# Patient Record
Sex: Male | Born: 1935 | Race: White | Hispanic: No | Marital: Married | Smoking: Former smoker
Health system: Southern US, Community
[De-identification: ages and names within clinical notes are randomized; demographics above are authoritative.]

## PROBLEM LIST (undated history)

## (undated) DIAGNOSIS — Z950 Presence of cardiac pacemaker: Secondary | ICD-10-CM

## (undated) DIAGNOSIS — I4430 Unspecified atrioventricular block: Secondary | ICD-10-CM

## (undated) DIAGNOSIS — I472 Ventricular tachycardia: Secondary | ICD-10-CM

## (undated) DIAGNOSIS — E785 Hyperlipidemia, unspecified: Secondary | ICD-10-CM

## (undated) DIAGNOSIS — I480 Paroxysmal atrial fibrillation: Secondary | ICD-10-CM

## (undated) DIAGNOSIS — I714 Abdominal aortic aneurysm, without rupture, unspecified: Secondary | ICD-10-CM

## (undated) DIAGNOSIS — I4729 Other ventricular tachycardia: Secondary | ICD-10-CM

## (undated) HISTORY — DX: Hyperlipidemia, unspecified: E78.5

## (undated) HISTORY — DX: Other ventricular tachycardia: I47.29

## (undated) HISTORY — DX: Unspecified atrioventricular block: I44.30

## (undated) HISTORY — DX: Ventricular tachycardia: I47.2

## (undated) HISTORY — PX: RETINAL TEAR REPAIR CRYOTHERAPY: SHX5304

## (undated) HISTORY — DX: Abdominal aortic aneurysm, without rupture: I71.4

## (undated) HISTORY — DX: Abdominal aortic aneurysm, without rupture, unspecified: I71.40

## (undated) HISTORY — PX: MENISCUS REPAIR: SHX5179

## (undated) HISTORY — DX: Presence of cardiac pacemaker: Z95.0

---

## 1999-12-04 ENCOUNTER — Other Ambulatory Visit: Admission: RE | Admit: 1999-12-04 | Discharge: 1999-12-04 | Payer: Self-pay | Admitting: Urology

## 2001-07-19 ENCOUNTER — Encounter: Admission: RE | Admit: 2001-07-19 | Discharge: 2001-07-19 | Payer: Self-pay | Admitting: Orthopedic Surgery

## 2001-07-19 ENCOUNTER — Encounter: Payer: Self-pay | Admitting: Orthopedic Surgery

## 2002-07-25 ENCOUNTER — Ambulatory Visit (HOSPITAL_COMMUNITY): Admission: RE | Admit: 2002-07-25 | Discharge: 2002-07-25 | Payer: Self-pay | Admitting: *Deleted

## 2002-07-25 ENCOUNTER — Encounter (INDEPENDENT_AMBULATORY_CARE_PROVIDER_SITE_OTHER): Payer: Self-pay | Admitting: Specialist

## 2002-11-29 ENCOUNTER — Encounter: Payer: Self-pay | Admitting: Internal Medicine

## 2002-11-29 ENCOUNTER — Encounter: Admission: RE | Admit: 2002-11-29 | Discharge: 2002-11-29 | Payer: Self-pay | Admitting: Internal Medicine

## 2005-05-06 ENCOUNTER — Ambulatory Visit (HOSPITAL_COMMUNITY): Admission: RE | Admit: 2005-05-06 | Discharge: 2005-05-06 | Payer: Self-pay | Admitting: Orthopedic Surgery

## 2006-06-07 ENCOUNTER — Encounter: Admission: RE | Admit: 2006-06-07 | Discharge: 2006-06-07 | Payer: Self-pay | Admitting: Orthopedic Surgery

## 2007-05-30 ENCOUNTER — Ambulatory Visit: Payer: Self-pay | Admitting: Vascular Surgery

## 2008-06-08 ENCOUNTER — Ambulatory Visit: Payer: Self-pay | Admitting: Vascular Surgery

## 2008-06-29 ENCOUNTER — Encounter: Admission: RE | Admit: 2008-06-29 | Discharge: 2008-06-29 | Payer: Self-pay | Admitting: Vascular Surgery

## 2008-06-29 ENCOUNTER — Ambulatory Visit: Payer: Self-pay | Admitting: Vascular Surgery

## 2008-08-02 ENCOUNTER — Encounter: Payer: Self-pay | Admitting: Vascular Surgery

## 2008-08-02 ENCOUNTER — Ambulatory Visit: Payer: Self-pay | Admitting: Vascular Surgery

## 2008-08-02 ENCOUNTER — Inpatient Hospital Stay (HOSPITAL_COMMUNITY): Admission: RE | Admit: 2008-08-02 | Discharge: 2008-08-07 | Payer: Self-pay | Admitting: Vascular Surgery

## 2008-08-02 HISTORY — PX: ABDOMINAL AORTIC ANEURYSM REPAIR: SUR1152

## 2008-08-15 ENCOUNTER — Inpatient Hospital Stay (HOSPITAL_COMMUNITY): Admission: EM | Admit: 2008-08-15 | Discharge: 2008-08-16 | Payer: Self-pay | Admitting: Emergency Medicine

## 2008-08-15 ENCOUNTER — Ambulatory Visit: Payer: Self-pay | Admitting: Vascular Surgery

## 2008-08-15 ENCOUNTER — Encounter: Admission: RE | Admit: 2008-08-15 | Discharge: 2008-08-15 | Payer: Self-pay | Admitting: Vascular Surgery

## 2008-11-02 ENCOUNTER — Encounter: Admission: RE | Admit: 2008-11-02 | Discharge: 2008-11-02 | Payer: Self-pay | Admitting: Vascular Surgery

## 2008-11-02 ENCOUNTER — Ambulatory Visit: Payer: Self-pay | Admitting: Vascular Surgery

## 2008-11-13 ENCOUNTER — Ambulatory Visit (HOSPITAL_COMMUNITY): Admission: RE | Admit: 2008-11-13 | Discharge: 2008-11-13 | Payer: Self-pay | Admitting: *Deleted

## 2009-04-24 IMAGING — CT CT PELVIS W/ CM
2 of 7 series · 13 of 46 positions shown, 18 images · IV contrast (OMNI 350, WATER & [ID] OMNI 300)
Comparison: 06/29/2008

CT ABDOMEN

CLINICAL DATA: Abdominal pain.  AAA repair 08/02/2008.

CT ABDOMEN AND PELVIS WITH CONTRAST
TECHNIQUE: Multidetector CT imaging of the abdomen and pelvis was
performed using the standard protocol following bolus
administration of intravenous contrast.
Contrast: 100 ml Pmnipaque-QBB

[Series 6: delays · axial · 0.70mm/px · z∈[-304,+56]mm · 10 of 86 slices shown, 15 images]
[im 7/86  soft-tissue]
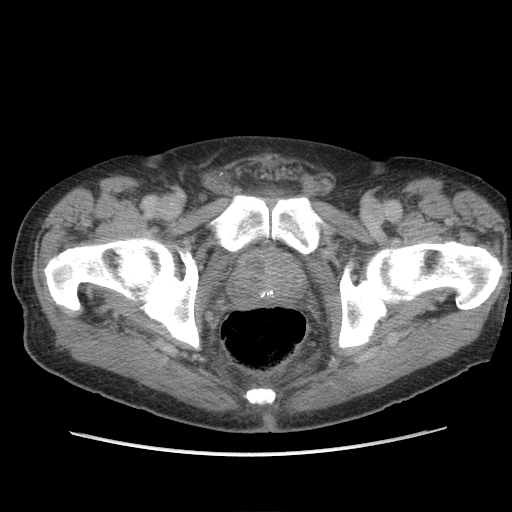
[im 7/86  bone]
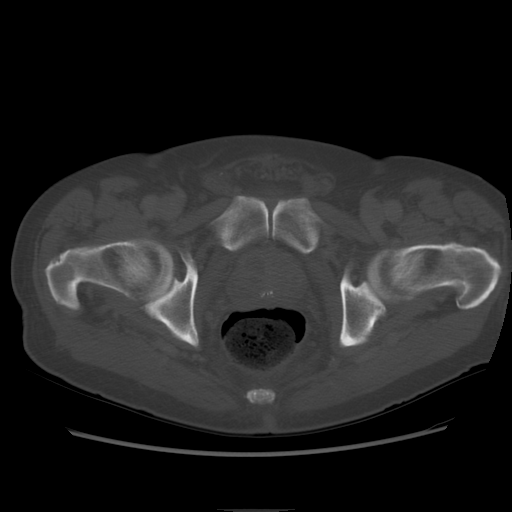
[im 20/86  soft-tissue]
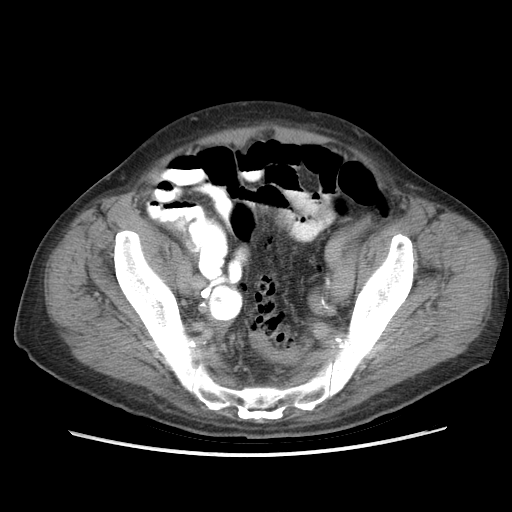
[im 27/86  soft-tissue]
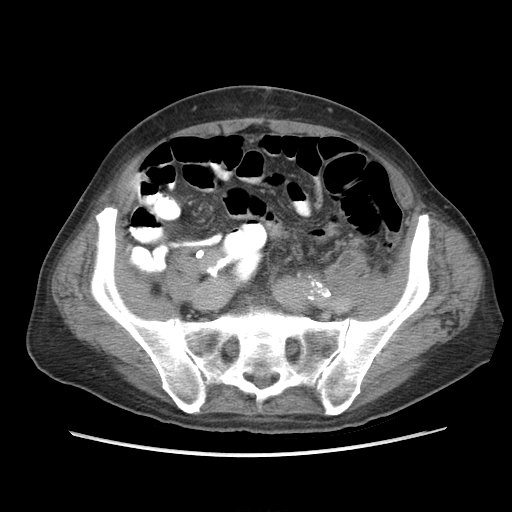
[im 33/86  soft-tissue]
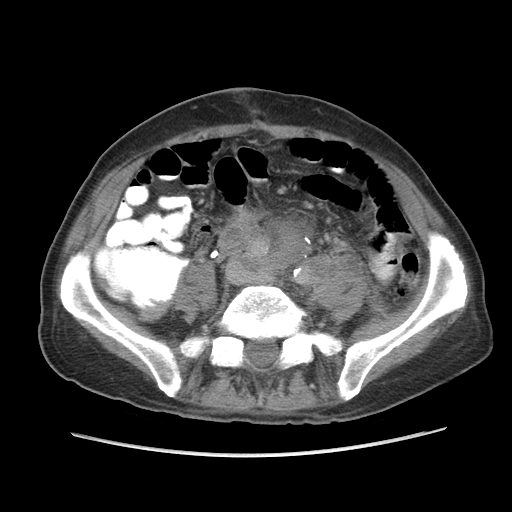
[im 46/86  soft-tissue]
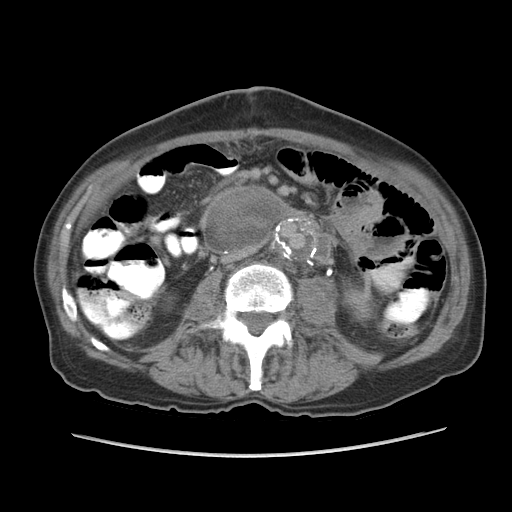
[im 53/86  soft-tissue]
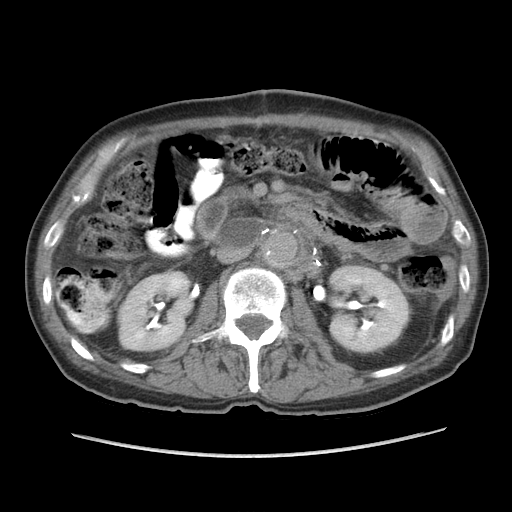
[im 59/86  soft-tissue]
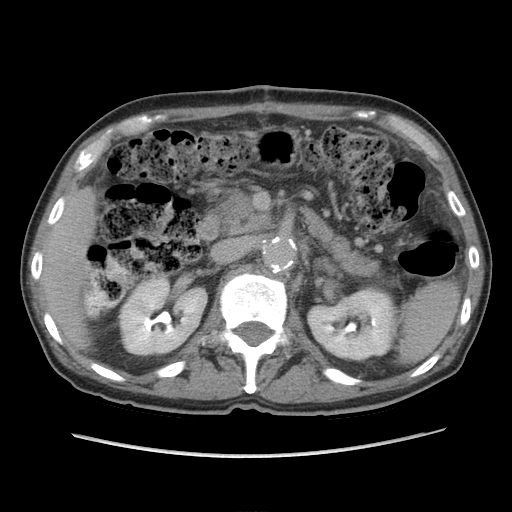
[im 59/86  lung]
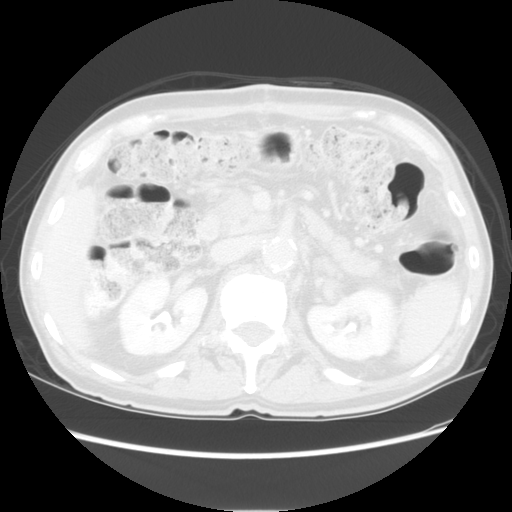
[im 66/86  lung]
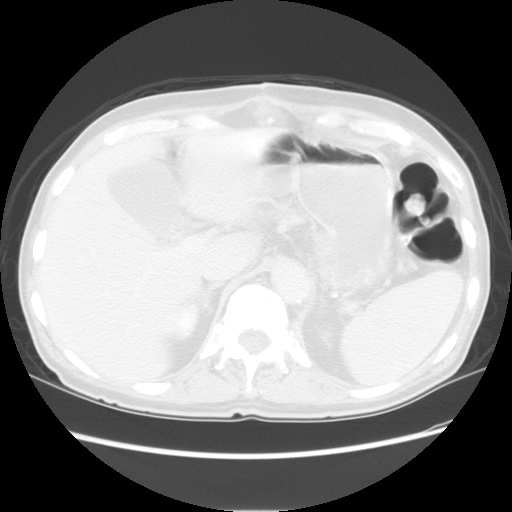
[im 72/86  soft-tissue]
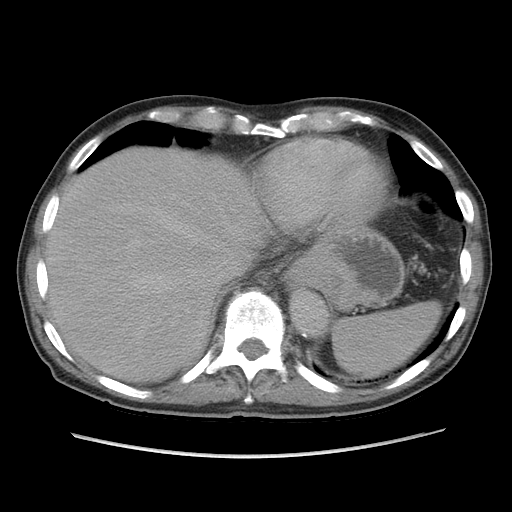
[im 72/86  lung]
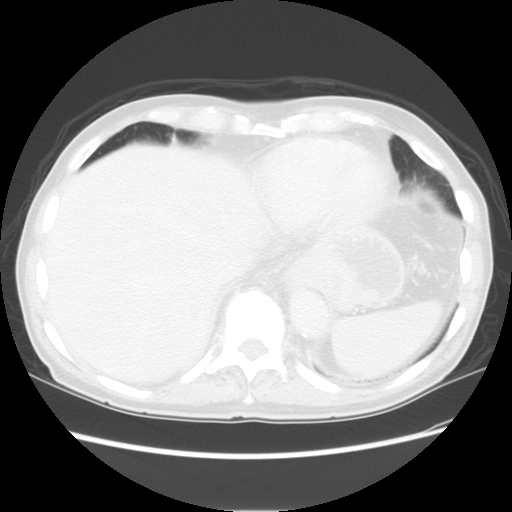
[im 79/86  soft-tissue]
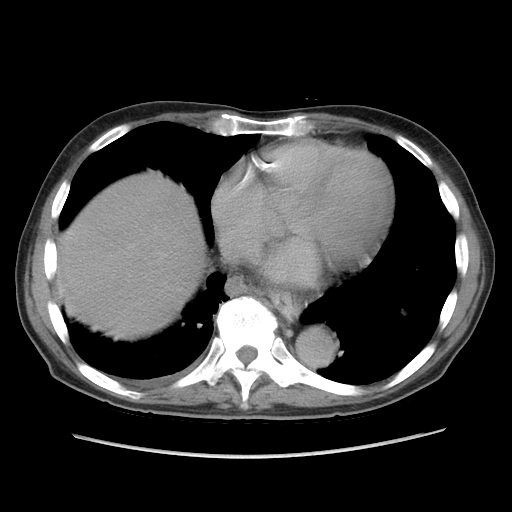
[im 79/86  lung]
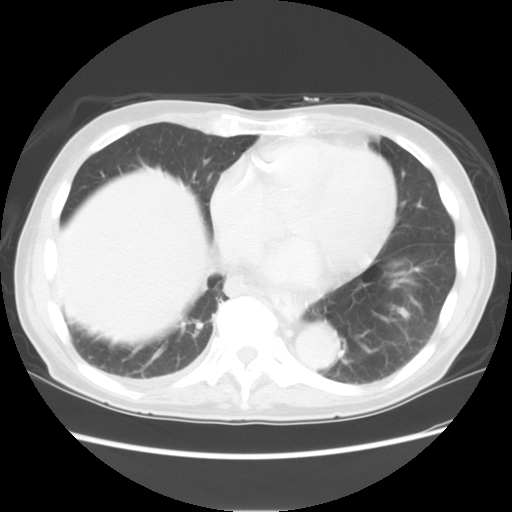
[im 79/86  bone]
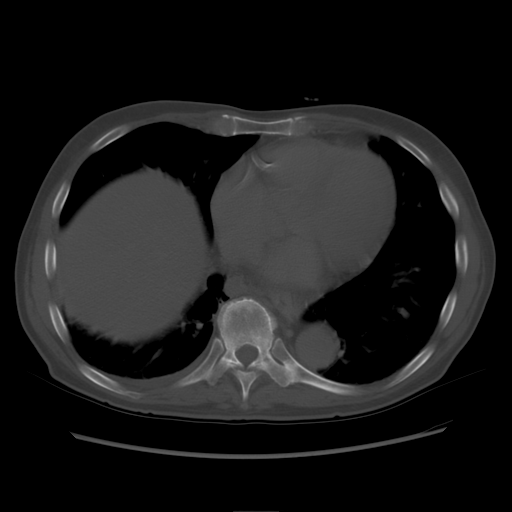

[Series 602: sagittal body · sagittal · 0.88mm/px · 3 of 142 slices shown]
[im 36/142  soft-tissue]
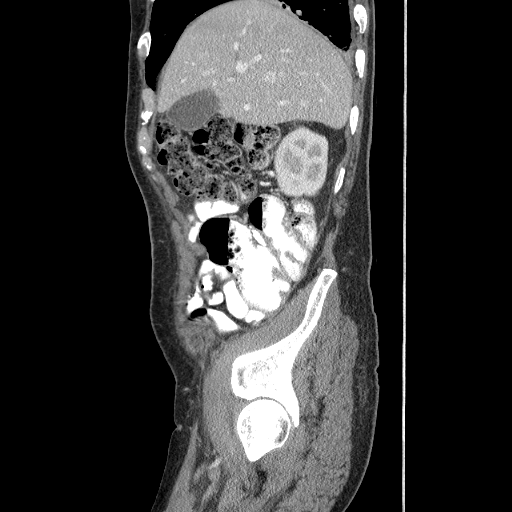
[im 71/142  soft-tissue]
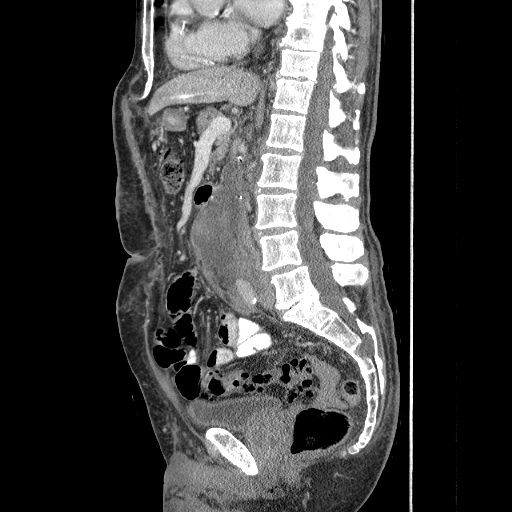
[im 106/142  soft-tissue]
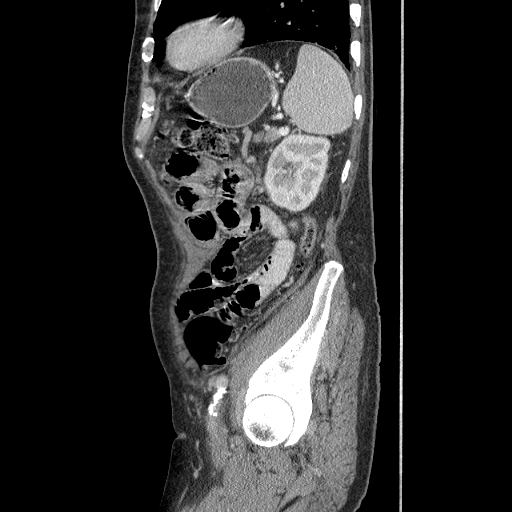

[13 of 46 positions shown; findings below may reference images not displayed]

FINDINGS: Lung bases show bibasilar scarring or atelectasis.
Trace right pleural effusion, a portion of which appears loculated
posterior to the IVC RA junction.  Heart size normal.  No
pericardial or pleural effusion.  Small hiatal hernia.

Mild intrahepatic biliary duct dilatation.  Liver, gallbladder,
adrenal glands, kidneys, spleen, pancreas, stomach and small bowel
otherwise unremarkable.

The patient is status post repair of an abdominal aortic aneurysm.
An accurate measurement of the native aneurysm sac is difficult
with adjacent hematoma.  The aorta and common iliac arteries
opacify symmetrically.  There is a mixed attenuation fluid
collection centered about the distal aorta and common iliac
arteries.  It measures approximately 8.3 x 4.0 x 8 11 cm.  Fluid
tracks superiorly along the right lateral aspect of the abdominal
aorta, as well as along the left psoas muscle.  Small amount of
high attenuation fluid is seen in the left paracolic gutter.
IMPRESSION: 1.  Patient is status post open repair of an abdominal aortic
aneurysm with a large periaortic hematoma and suspected active
bleeding.  This was discussed with Dr. Anthonny at 8082 hours on
08/15/2008. Per Dr. Kianna request, the patient has been
instructed to immediately report to [REDACTED].
2.  Hemoperitoneum.
3.  Trace right pleural effusion.

CT PELVIS
FINDINGS: Prostate is mildly enlarged.  High attenuation fluid
layers dependently in the pelvis.  Colon and appendix are
unremarkable.  No pathologically enlarged lymph nodes.  No
worrisome lytic or sclerotic lesions.
IMPRESSION: 1.  Hemoperitoneum.

## 2010-05-05 ENCOUNTER — Encounter: Admission: RE | Admit: 2010-05-05 | Discharge: 2010-05-05 | Payer: Self-pay | Admitting: Cardiology

## 2010-05-09 ENCOUNTER — Inpatient Hospital Stay (HOSPITAL_COMMUNITY): Admission: RE | Admit: 2010-05-09 | Discharge: 2010-05-10 | Payer: Self-pay | Admitting: Cardiology

## 2010-05-09 DIAGNOSIS — Z950 Presence of cardiac pacemaker: Secondary | ICD-10-CM

## 2010-05-09 HISTORY — DX: Presence of cardiac pacemaker: Z95.0

## 2010-12-15 ENCOUNTER — Encounter: Payer: Self-pay | Admitting: Vascular Surgery

## 2010-12-31 ENCOUNTER — Other Ambulatory Visit: Payer: Self-pay | Admitting: Otolaryngology

## 2011-02-08 LAB — SURGICAL PCR SCREEN
MRSA, PCR: NEGATIVE
Staphylococcus aureus: NEGATIVE

## 2011-04-07 NOTE — Discharge Summary (Signed)
[name], [name] NO.:  [id number]   MEDICAL RECORD NO.:  [id number]          PATIENT TYPE:  INP   LOCATION:  2041                         FACILITY:  [facility]   PHYSICIAN:  Larina Earthly, M.D.    DATE OF BIRTH:  Apr 21, 1924   DATE OF ADMISSION:  08/02/2008  DATE OF DISCHARGE:  08/07/2008                               DISCHARGE SUMMARY   DISCHARGE DIAGNOSES:  1. Abdominal aortic aneurysm.  2. Hypertension.  3. Dyslipidemia.   PROCEDURES PERFORMED:  Repair of abdominal aortic aneurysm, August 02, 2008.   DISCHARGE MEDICATIONS:  1. Moexipril 15/25 one p.o. daily.  2. __________, a statin, one p.o. daily.  3. Percocet 5/325 one p.o. q.4 h. p.r.n. pain, total #40 were given.   COMPLICATIONS:  None.   CONDITION AT DISCHARGE:  Stable, improving.   DISPOSITION:  He is being discharged home.  He was instructed in the  care of his wounds.  He was instructed to observe the wounds for signs  of infection.  He was instructed not to drive for the next several weeks  or lift objects weighing greater than 10 pounds for the next several  weeks.  He is to see Dr. Arbie Cookey in the next 2-3 weeks with ABIs.   BRIEF IDENTIFYING STATEMENT:  For complete details, please refer to the  typed history and physical.  Briefly, this very pleasant 77 year old  gentleman evaluated by Dr. Arbie Cookey for a juxtarenal abdominal aortic  aneurysm.  Dr. Arbie Cookey felt him to be a suitable candidate for open  repair.  He was informed of the risks and benefits of the procedure, and  after careful consideration he elected to proceed with surgery.   HOSPITAL COURSE:  Preoperative workup was completed as an outpatient.  He was brought in through [facility] Day Surgery and underwent the  aforementioned repair of a juxtarenal aneurysm.  For complete details,  please refer the typed operative report.  The procedure was without  complications.  He was returned to the intensive care unit in critical  condition.   He was extubated.   On the first postoperative day, he did have blood pressure issues mainly  consistent of hypotension.  These had resolved by the second  postoperative day, and he was able to be transferred to a bed in  Surgical Step-Down Unit.   His activity level was advanced as was his diet.  He was walking and  improving with physical therapy.  His wounds were healing well.  He was  felt stable for discharge on August 07, 2008, and he was discharged  home in stable condition.       Wilmon Arms, PA      Larina Earthly, M.D.  Electronically Signed    KEL/MEDQ  D:  08/08/2008  T:  08/09/2008  Job:  [id number]

## 2011-04-07 NOTE — Assessment & Plan Note (Signed)
OFFICE VISIT   William [name], William [name]  DOB:  September 09, 1924                                       11/02/2008  ZOXWR#:[id number]   The patient presents today for continued follow-up of his open repair of  a juxtarenal abdominal aortic aneurysm.  The surgery was on August 02, 2008.  He did very well in the hospital and was discharged home  without difficulty.  He presented with back pain on August 16, 2008,  and underwent CAT scan showing retroperitoneal hematoma.  He underwent  arteriography which showed no evidence of bleed and was admitted  overnight for evaluation.  He remained stable and was discharged.  He  reports today that he has returned to his normal preoperative stamina,  appetite, and activity level.  He did report losing 17 pounds around the  time of surgery and is slowly gaining this back.   PHYSICAL EXAMINATION:  His abdominal incision is well-healed.  He has 2+  femoral pulses bilaterally.   He underwent a CAT scan today and this shows marked regression of the  retroperitoneal hematoma with no evidence of extravasation and normal  flow through his graft.  I am quite pleased with the patient's result,  as is the patient.  I plan to see him again on an as-needed basis.   Larina Earthly, M.D.  Electronically Signed   TFE/MEDQ  D:  11/02/2008  T:  11/05/2008  Job:  [id number]   cc:   Soyla Murphy. Renne Crigler, M.D.

## 2011-04-07 NOTE — Discharge Summary (Signed)
NAME:  William [name], William [name] NO.:  [id number]   MEDICAL RECORD NO.:  [id number]          PATIENT TYPE:  INP   LOCATION:  2008                         FACILITY:  [facility]   PHYSICIAN:  Larina Earthly, M.D.    DATE OF BIRTH:  01/24/1924   DATE OF ADMISSION:  08/15/2008  DATE OF DISCHARGE:  08/16/2008                               DISCHARGE SUMMARY   ADMISSION DIAGNOSIS:  Abdominal pain with possible retroperitoneal  hematoma, status post recent open aortic aneurysm repair.   DISCHARGE/SECONDARY DIAGNOSES:  1. Abdominal pain improved with widely patent aortoiliac graft without      evidence of extravasation or leak.  2. Hypertension.  3. Dyslipidemia.   PROCEDURES:  On August 16, 2008, aortogram with bilateral iliac  arteriogram and ultrasound guided axis the right common femoral artery  by Dr. Waverly Ferrari, findings showing widely patent aortoiliac  graft.  There is no evidence of extravasation of the proximal and distal  anastomosis.  The right renal artery originate superior to the left  renal artery and there was slight ectasia of the aorta in this region,  however, no evidence of leak or extravasation.  The other pathology was  noted.   BRIEF HISTORY:  William [name] is an 77 year old male who underwent repair of  an abdominal aortic aneurysm by Dr. Gretta Began on August 02, 2008.  He did well postoperatively and was discharged home August 07, 2008.  Two days prior to admission, he developed gradual onset of lower back  pain more so on the right side.  He had no dizziness or lightheadedness.  Pain persisted all day through August 14, 2008, and had been fairly  constant.  There was no aggravating or alleviating factors.  On  Wednesday, he saw Dr. Tawanna Cooler Early at the [facility] office who sent him for a CT  scan.   FINDINGS:  Findings were thought consistent with a large retroperitoneal  hematoma adjacent to the graft.  However, there was no evidence of  active  bleeding.  Since the symptoms had been stable over the last 2  days and his hemoglobin appeared stable was felt that he should be  admitted for close observation with plans for arteriogram to evaluate  for extravasation or leaking from his aorta graft.   HOSPITAL COURSE:  William [name] was admitted to [facility]  August 15, 2008.  He was into the telemetry unit, placed on bedrest  and bathroom privileges.  He was typed and crossed for 2 units of packed  red blood cells.  In case, there was some drop his hemoglobin.  He was  accepted for the aortogram. which was done on August 16, 2008, with  results as discussed above.  Since following the procedure.  Following  his recovery from the procedure.  His pain remained stable and there is  no significant drop in his hemoglobin or hematocrit and it was felt  appropriate for discharge home with follow-up with Dr. Arbie Cookey in 2 weeks.   LABORATORY DATA AT DISCHARGE:  White count of 4.8, hemoglobin 10.4,  hematocrit 30.6, platelet count 353,  sodium 132, potassium 3.8, glucose  147, BUN of 12, creatinine 0.81, and calcium of 8.7.  Coags were normal.  Of note, his admission hemoglobin and hematocrit were 10.4 and 30.6  respectively, which were increased from his hemoglobin/hematocrit from  August 06, 2008.   DISCHARGE MEDICATIONS:  1. Moexipril 10/25 mg one p.o. daily.  2. Percocet 5/325 mg one p.o. q.4 h. p.r.n. pain.   DISCHARGE INSTRUCTIONS:  To avoid heavy lifting and continue heart-  healthy diet.  Follow up with Dr. Tawanna Cooler Early in approximately 2 weeks  with plans for followup CT scan.  Our office will contact him regarding  specific appointment date and time.      Jerold Coombe, P.A.      Larina Earthly, M.D.  Electronically Signed    AWZ/MEDQ  D:  08/16/2008  T:  08/17/2008  Job:  [id number]

## 2011-04-07 NOTE — Procedures (Signed)
DUPLEX ULTRASOUND OF ABDOMINAL AORTA   INDICATION:  Follow-up of known abdominal aortic aneurysm.   HISTORY:  Diabetes:  No.  Cardiac:  No.  Hypertension:  Yes.  Smoking:  No, quit in 1980.  Family History:  No.  Previous Surgery:  No.   DUPLEX EXAM:         AP (cm)                   TRANSVERSE (cm)  Proximal             1.8 cm.                   1.88 cm  Mid                  M1 = 3.51 cm, M2 = 3.83   M1 = 3.88, M2 = 4.01  Distal               1.51 cm                   1.73 cm  Right Iliac          1.02 cm                   1.07 cm  Left Iliac           1.06 cm                   1.05 cm   PREVIOUS:  Date:  06/14/2006  AP:  3.95  TRANSVERSE:  3.99   IMPRESSION:  Stable measurements of known bilobular abdominal aortic  aneurysm, with mural thrombus.   ___________________________________________  Larina Earthly, M.D.   AS/MEDQ  D:  05/30/2007  T:  05/30/2007  Job:  [id number]

## 2011-04-07 NOTE — Assessment & Plan Note (Signed)
OFFICE VISIT   AVON, MERGENTHALER  DOB:  08-21-1924                                       06/08/2008  JWJXB#:[id number]   The patient presents today for continued followup of his known  infrarenal abdominal aortic aneurysm.  I have been following this in our  office since its initial discovery in 2003.  He has had very minimal  growth in the last 5 years.  He reports that, over the last 10 days, he  has had some upper right chest discomfort over his anterior ribcage, and  also some mild back discomfort, which has resolved.  Otherwise, he  remains in excellent health with no cardiac difficulties.  He remains  quite active at his age of 60.  He does not smoke, having quit in 1985.  He drinks wine per day.  His weight is 150 pounds.  He is 5 feet 10  inches tall.   MEDICATION LIST:  A diuretic pill daily.   PHYSICAL EXAM:  Well-developed, well-nourished white male appearing  younger than stated age of 67.  Blood pressure 160/80, pulse 72,  respirations 18.  His radial pulses are 2+.  He has 2+ femoral, 2+  popliteal, and 2+ posterior tibial pulses without evidence of peripheral  aneurysm.  He is thin with an easily palpable abdominal aortic aneurysm,  which is nontender.   Ultrasound today shows significant change from his study 1 year ago.  One year ago, his maximal diameter was 4 cm and today, his maximal  diameter is 5.1 cm.  I discussed this at length with the patient.  I  explained that rapid growth over the course of a year does put him at  higher risk for rupture.  He will be scheduled for a CT scan for more  thorough evaluation of this and I will see him for further discussion in  3 weeks.  I explained the operative treatment of either open aneurysm  repair or stent graft repair with the patient, and would certainly  recommend stent graft repair if he is an anatomic candidate.  He  understands.  We will discuss this further in 3 weeks.   Larina Earthly, M.D.  Electronically Signed   TFE/MEDQ  D:  06/08/2008  T:  06/11/2008  Job:  [id number]   cc:   Soyla Murphy. Renne Crigler, M.D.

## 2011-04-07 NOTE — Procedures (Signed)
DUPLEX ULTRASOUND OF ABDOMINAL AORTA   INDICATION:  Follow-up evaluation of known abdominal aortic aneurysm.  Patient reports abdominal/back pain for the last 10 days.   HISTORY:  Diabetes:  No.  Cardiac:  Angina for 10 days.  Hypertension:  Hypertension, medically controlled.  Smoking:  Quit in 1980.  Connective Tissue Disorder:  Family History:  No.  Previous Surgery:  No.   DUPLEX EXAM:         AP (cm)                   TRANSVERSE (cm)  Proximal             2.7 cm                    3.2 cm  Mid                  proximal 4.4 cm, distal 4.4 cm                  mid  proximal 5.1 cm, distal 4.3 cm  Distal               2.6 cm                    3.1 cm  Right Iliac          1.1 cm                    1.2 cm  Left Iliac           0.9 cm                    1.1 cm   PREVIOUS:  Date: 05/30/07  AP:  3.8  TRANSVERSE:  4.0   IMPRESSION:  Abdominal aortic aneurysm with two distinct areas of  dilation identified.  Both areas of dilation are slightly larger than  previously recorded.   ___________________________________________  Larina Earthly, M.D.   MC/MEDQ  D:  06/08/2008  T:  06/08/2008  Job:  [id number]

## 2011-04-07 NOTE — Assessment & Plan Note (Signed)
OFFICE VISIT   William [name], William [name]  DOB:  1924-05-03                                       08/15/2008  ZOXWR#:[id number]   Patient presents today for continued followup of his infrarenal  abdominal aortic aneurysm repair on 08/02/08.  I had seen him one week  ago in the office.  He had been doing quite well with early uneventful  recovery from his aneurysm repair.  He presents today with a two-day  history of worsening pain.  He reports this is mid abdominal, going  around to his right flank and back.  This is not positional.  He has had  no difficulty with bowel movements and no nausea or vomiting.  He has  continued to void normally.   PHYSICAL EXAMINATION:  His abdominal wound is well healed.  He has no  tenderness in his abdomen aside from the usual incisional tenderness,  now 13 days out from an aneurysm repair.  He has 2+ femoral pulses.   I discussed this with [name] and his wife, present.  I explained it  was concerning that he is having new pain, since he had really had  minimal postoperative pain initially.  I have recommended that he go for  a CAT scan today.  I did discuss concerns regarding possible renal  infarct versus bleed.  I did discuss this concern with patient and wife.  He will go for a CAT scan now, and we will discuss this further with him  pending the CAT scan results.   Larina Earthly, M.D.  Electronically Signed   TFE/MEDQ  D:  08/15/2008  T:  08/16/2008  Job:  [id number]   cc:   Soyla Murphy. Renne Crigler, M.D.

## 2011-04-07 NOTE — Assessment & Plan Note (Signed)
OFFICE VISIT   William [name], William [name]  DOB:  08/24/1924                                       06/29/2008  ZOXWR#:[id number]   The patient presents today for continued discussion regarding his  expanding infrarenal abdominal aortic aneurysm.  I have seen him for  routine followup on 06/08/2008.  At that time, his ultrasound suggested  marked increased enlargement, up to greater than 5 cm from essentially 4  cm the study before.  He underwent CAT scan today and we had this for  discussion.  He does have a greater than 5 cm infrarenal abdominal  aortic aneurysm.  I had discussed treatment options with him with stent  graft repair and open repair.  He, unfortunately, does not have  appropriate anatomy for stent graft repair.  His aneurysm starts just  below the level of his renal arteries.  He does have good target vessels  to sew to his iliac arteries bilaterally.  I had a long discussion with  the patient and his wife, and have recommended open repair of his  enlarging infrarenal abdominal aortic aneurysm.  This is due to the size  greater than 5 cm and also significant growth over a short period of  time.  He does not have any major medical difficulties, specifically no  cardiac disease.  I have asked that we get him seen by cardiologist  prior to his surgery to rule out any hidden cardiac disease, which would  increase his morbidity at the time of surgery.  He understands this and  I will arrange this after discussing this with Dr. Merri Brunette.   Larina Earthly, M.D.  Electronically Signed   TFE/MEDQ  D:  06/29/2008  T:  07/02/2008  Job:  [id number]   cc:   Soyla Murphy. Renne Crigler, M.D.

## 2011-04-07 NOTE — H&P (Signed)
NAME:  William [name], RAMBERT NO.:  [id number]   MEDICAL RECORD NO.:  [id number]          PATIENT TYPE:  INP   LOCATION:  2008                         FACILITY:  [facility]   PHYSICIAN:  Di Kindle. Edilia Bo, M.D.DATE OF BIRTH:  July 30, 1924   DATE OF ADMISSION:  08/15/2008  DATE OF DISCHARGE:                              HISTORY & PHYSICAL   REASON FOR ADMISSION:  Abdominal pain with retroperitoneal hematoma.   HISTORY:  This is an 77 year old gentleman who underwent repair of an  abdominal aortic aneurysm by Dr. Arbie Cookey on August 02, 2008.  This was  a juxtarenal aneurysm.  He did well postoperatively and was discharged  on August 07, 2008.  Two days ago, which was Monday night, he  developed the gradual onset of lower back pain more so on the right  side.  He had no dizziness or lightheadedness.  The pain persisted all  day through Tuesday and has been fairly constant.  There are really no  aggravating or alleviating factors.  The pain had persisted today, which  is Wednesday and he was seen by Dr. Arbie Cookey in the office who sent him for  a CT scan.  The CT scan showed a large retroperitoneal hematoma adjacent  to the graft and he is sent to the emergency department for admission.   PAST MEDICAL HISTORY:  Fairly unremarkable.  He does have a history of  hypertension.  He denies any history of diabetes, hypercholesterolemia,  history of previous myocardial infarction, history of congestive heart  failure, or history of COPD.   PAST SURGICAL HISTORY:  Significant for repair of abdominal aortic  aneurysm on August 02, 2008, with aortobiiliac graft.  This was with  a 16 x 8 Dacron graft.  He also had a retina repair in the past.   SOCIAL HISTORY:  He is married.  He has 2 children.  He has a remote  history of tobacco use.   FAMILY HISTORY:  There is no history of premature cardiovascular  disease.  There is no history of aneurysmal disease that he is aware of.   ALLERGIES:  None.   MEDICATIONS:  1. Moexipril 15 mg p.o. daily.  2. Oxycodone one p.o. q.4 h. p.r.n. 5 mg.   REVIEW OF SYSTEMS:  GENERAL:  He has had no recent weight loss, weight  gain, problem with his appetite, fever, or chills.  CARDIAC:  He has had  no chest pain, chest pressure, palpitations, or arrhythmias.  He does  admit to some dyspnea on exertion since he has been home.  PULMONARY:  He has had no recent productive cough, bronchitis, asthma, or wheezing.  GI:  He has had no recent change in his bowel habits and has no history  of peptic ulcer disease.  GU:  He has had no dysuria or frequency.  VASCULAR:  He has had no history of stroke, claudication, rest pain, or  nonhealing ulcers.  NEUROLOGIC:  He has had no dizziness, blackouts,  headaches, or seizures.  HEMATOLOGIC:  He has had no bleeding problems  or clotting disorders.   PHYSICAL EXAMINATION:  GENERAL:  This is a pleasant 77 year old  gentleman who appears his stated age.  VITAL SIGNS:  Blood pressure is 118/68 and heart rate is 72.  NECK:  Supple.  There is no cervical lymphadenopathy.  I do not detect  any carotid bruits.  LUNGS:  Clear bilaterally to auscultation.  CARDIAC:  He has a regular rate and rhythm.  ABDOMEN:  Soft and nontender.  He has normal pitch bowel sounds.  He has  no focal tenderness.  It is not distended.  EXTREMITIES:  He has palpable femoral, popliteal, and pedal pulses.  He  has no significant lower extremity swelling.  NEUROLOGIC:  Nonfocal.   I did review his CT scan of the abdomen and pelvis with Dr. __________  and there is a large retroperitoneal hematoma adjacent to the graft.  However, on today's study, there is no clear-cut evidence of active  bleeding.  Currently, the symptoms have been stable over the last 2  days.  I have discussed the case with Dr. Arbie Cookey and we will admit the  patient for close observation with plans for an arteriogram tomorrow.  We will check a hemoglobin  now and also in the morning, and make sure he  is typed and crossed for 2 units.  We will check his renal function  prior to his arteriogram and make further recommendations pending the  results of his arteriogram.      Di Kindle. Edilia Bo, M.D.  Electronically Signed     CSD/MEDQ  D:  08/15/2008  T:  08/16/2008  Job:  [id number]

## 2011-04-07 NOTE — Op Note (Signed)
William [name], William [name] NO.:  [id number]   MEDICAL RECORD NO.:  [id number]          PATIENT TYPE:  INP   LOCATION:  2899                         FACILITY:  [facility]   PHYSICIAN:  Larina Earthly, M.D.    DATE OF BIRTH:  May 17, 1924   DATE OF PROCEDURE:  08/02/2008  DATE OF DISCHARGE:                               OPERATIVE REPORT   PREOPERATIVE DIAGNOSIS:  Juxtarenal abdominal aortic aneurysm.   POSTOPERATIVE DIAGNOSIS:  Juxtarenal abdominal aortic aneurysm.   PROCEDURE:  Resection and grafting, abdominal aortic aneurysm, repair  with an 16 x 8 Hemashield aorta to bilateral common iliac artery bypass.   SURGEON:  Larina Earthly, MD   ASSISTANT:  Wilmon Arms, PA-C   ANESTHESIA:  General endotracheal.   COMPLICATIONS:  None.   DISPOSITION:  To recovery room, stable.   PROCEDURE IN DETAIL:  The patient was taken to the operating room and  placed in supine position.  The area over the abdomen and both groins  were prepped and draped in the usual sterile fashion.  An incision was  made from the level of the xiphoid to below the umbilicus and carried  down through the midline fascia with electrocautery. The liver, stomach,  large and small bowels, and gallbladder were all normal.  The Omni-Tract  retractor was used for exposure.  The transverse colon and omentum were  reflected superiorly, and the small bowel was reflected to the right.  The duodenum was mobilized off the aortic aneurysm.  The aneurysm did  extend up to the level of the left renal artery which was significantly  lower than the right renal artery.  The aorta was encircled above the  level of the left renal artery and below the level of the left renal  artery and the left renal artery itself was controlled with a blue  vessel loop.  Dissection was then continued down to the bifurcation.  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  The common iliac arteries  were encircled with vessels  bilaterally and were non aneurysmal.  There was some calcification in  the iliac arteries, but they were adequate for sewing.  The patient was  given 25 g of mannitol and 7000 units of intravenous heparin.  After  adequate circulation time, the left renal artery was occluded with a  serrefine clamp.  The aorta was occluded.  The clamp was placed above  the left renal and below the right renal arteries.  The iliac arteries  were controlled with Henley clamps bilaterally.  The aneurysm was opened  with electrocautery in the midline and mural thrombus was removed.  Lumbar back bleeders were controlled with 2-0 figure-of-eight silk  sutures.  The aorta was transected above the level of the aneurysm.  A  16 x 8 Hemashield graft was brought onto the field and using a felt  strip and proximal anastomosis reinforcement the grafts was sewn end to  end to the aorta with a running 3-0 Prolene suture.  Prior to completion  of the anastomosis, the left renal and  aorta were flushed.  The clamps  were placed on the graft and the clamps removed from the left renal and  the aorta.  Good technical anastomosis was encountered.   The right and left limbs of the graft were cut to the appropriate  dimension and were sewn end to end to the common iliac arteries  bilaterally with running 5-0 Prolene sutures.  Prior to completion of  each anastomosis, usual flushing maneuvers were undertaken and  anastomosis completed.  Flow was restored to the right and left legs.  There were excellent femoral pulses.  The patient was given 50 mg of  protamine to reverse the heparin.  Total time of occlusion of the left  renal artery was 24 minutes.  The wounds were irrigated with saline.  Hemostasis was obtained with electrocautery.  The walls of the aneurysm  were closed over the graft with a running 3-0 Vicryl suture.  Next, the  retroperitoneum was closed with a running 3-0 Vicryl suture.  The  small  bowel was run in its entirety and found to be without injury and was  placed back into the pelvis.  The transverse colon and omentum were  placed over the small bowel.  Midline fascia was closed with a #1 PDS  suture beginning proximally and distally and tying in the middle.  The  skin was closed with 4-0 subcuticular Vicryl stitch.  Sterile dressing  was applied, and the patient was taken to the recovery room in stable  condition.      Larina Earthly, M.D.  Electronically Signed     TFE/MEDQ  D:  08/02/2008  T:  08/03/2008  Job:  [id number]

## 2011-04-07 NOTE — Procedures (Signed)
LOWER EXTREMITY ARTERIAL EVALUATION-SINGLE LEVEL   INDICATION:  Left calf pain, status post abdominal aortic aneurysm  repair.   HISTORY:  Diabetes:  No.  Cardiac:  Angina.  Hypertension:  Medically controlled.  Smoking:  No.  Previous Surgery:  Abdominal aortic aneurysm repair on 08/02/08 by Dr.  Arbie Cookey.   RESTING SYSTOLIC PRESSURES: (ABI)                          RIGHT                LEFT  Brachial:               130                  130  Anterior tibial:        126                  126  Posterior tibial:       136 (>1.0)           148 (>1.0)  Peroneal:  DOPPLER WAVEFORM ANALYSIS:  Anterior tibial:        Triphasic            Triphasic  Posterior tibial:       Triphasic            Triphasic  Peroneal:   PREVIOUS ABI'S:  Date:  RIGHT:  LEFT:   IMPRESSION:  Ankle brachial indices and waveforms suggest no significant  arterial occlusive disease bilaterally.   ___________________________________________  Larina Earthly, M.D.   MC/MEDQ  D:  08/15/2008  T:  08/15/2008  Job:  [id number]

## 2011-04-07 NOTE — Op Note (Signed)
NAME:  William [name], William [name] NO.:  [id number]   MEDICAL RECORD NO.:  [id number]          PATIENT TYPE:  INP   LOCATION:  2008                         FACILITY:  [facility]   PHYSICIAN:  Di Kindle. Edilia Bo, M.D.DATE OF BIRTH:  1924/05/06   DATE OF PROCEDURE:  08/16/2008  DATE OF DISCHARGE:                               OPERATIVE REPORT   PREOPERATIVE DIAGNOSES:  Retroperitoneal hematoma versus lymphocele,  status post aortobiiliac bypass graft for abdominal aortic aneurysm.   POSTOPERATIVE DIAGNOSES:  Retroperitoneal hematoma versus lymphocele,  status post aortobiiliac bypass graft for abdominal aortic aneurysm.   PROCEDURES:  1. Aortogram with bilateral iliac arteriogram.  2. Ultrasound-guided access to the right common femoral artery.   SURGEON:  Di Kindle. Edilia Bo, MD   ANESTHESIA:  Local.   INDICATIONS:  This is an 77 year old gentleman who had undergone an  aortobiiliac bypass graft for repair of a juxtarenal abdominal aortic  aneurysm by Dr. Arbie Cookey 2 weeks ago.  He had presented with some abdominal  pain, and a CT scan showed a fluid collection adjacent to the graft  extending down to the left limb of the graft.  It was felt that this  most likely represented a hematoma.  The CT scan did not show any  obvious point of extravasation; however, arteriography was recommended  in order to look for evidence of extravasation and active bleeding.  The  differential diagnosis also included the lymphocele.   TECHNIQUE:  The patient was taken to the PV lab, and the groins were  prepped and draped in the usual sterile fashion.  After the skin was  anesthetized with 1% lidocaine and under ultrasound guidance, the right  common femoral artery was cannulated, and a guidewire introduced into  the infrarenal aorta under fluoroscopic control.  A 5-French sheath was  introduced over the wire.  A pigtail catheter was positioned at the L1  vertebral body, and flush aortogram  obtained.  Catheter was then  repositioned above the bifurcation of the graft and oblique iliac  projections were obtained.   FINDINGS:  The aortoiliac graft is widely patent.  There was no evidence  of extravasation at the proximal or distal anastomoses.  The right renal  artery originates superior to the left renal artery, and there is some  slight ectasia of the  aorta in this region; however, no evidence of leak or extravasation.  The bypass graft is widely patent.  No other pathology is noted.   CONCLUSIONS:  No evidence of extravasation from aortoiliac graft.      Di Kindle. Edilia Bo, M.D.  Electronically Signed     CSD/MEDQ  D:  08/16/2008  T:  08/16/2008  Job:  [id number]

## 2011-04-07 NOTE — Op Note (Signed)
NAME:  William [name], William [name] NO.:  [id number]   MEDICAL RECORD NO.:  [id number]          PATIENT TYPE:  AMB   LOCATION:  ENDO                         FACILITY:  [facility]   PHYSICIAN:  Georgiana Spinner, M.D.    DATE OF BIRTH:  [birth year]   DATE OF PROCEDURE:  11/13/2008  DATE OF DISCHARGE:                               OPERATIVE REPORT   PROCEDURE:  Flexible sigmoidoscopy.   INDICATIONS:  Colon polyps.   ANESTHESIA:  Fentanyl 62.5 mcg, Versed 6 mg.   PROCEDURE:  With the patient mildly sedated in the left lateral  decubitus position, the Pentax videoscopic pediatric colonoscope was  inserted in the rectum, passed under direct vision to the sigmoid colon  at which point we encountered significant diverticulosis and a poor  prep.  There was solid stool and brown gravy-like material in the colon  presumably secondary to the significant diverticulosis.  At any rate, it  got to the point where I could not proceed because of visualization  being poor, and an acute turn in the colon was noted, possibly related  to recent surgery.  At any rate, I elected not to proceed any further  but withdrew the colonoscope, taking circumferential views of the  remaining colonic mucosa, stopping in the rectum which appeared normal  on direct and retroflexed view.  The endoscope was straightened and  withdrawn.  The patient's vital signs and pulse oximeter remained  stable.  The patient tolerated the procedure well without apparent  complication.   FINDINGS:  Diverticulosis, fairly severe, of the sigmoid colon.  Poor  prep precluding thorough examination.  Will proceed with barium enema  and have patient follow-up with me as an outpatient.           ______________________________  Georgiana Spinner, M.D.     GMO/MEDQ  D:  11/13/2008  T:  11/13/2008  Job:  [id number]

## 2011-04-10 NOTE — Op Note (Signed)
   NAME:  William [name], BROOKS NO.:  [id number]   MEDICAL RECORD NO.:  [id number]                   PATIENT TYPE:  AMB   LOCATION:  ENDO                                 FACILITY:  [facility]   PHYSICIAN:  Georgiana Spinner, M.D.                 DATE OF BIRTH:  Mar 22, 1924   DATE OF PROCEDURE:  07/25/2002  DATE OF DISCHARGE:                                 OPERATIVE REPORT   PROCEDURE:  Colonoscopy.   INDICATIONS:  Colon polyp.   ANESTHESIA:  Demerol 100 mg, Versed 10 mg.   DESCRIPTION OF PROCEDURE:  With the patient mildly sedated in the left  lateral decubitus position, a rectal exam was performed which was  unremarkable.  Subsequently the Olympus videoscopic colonoscope was inserted  in the rectum, passed under direct vision to the cecum, identified by  ileocecal valve and appendiceal orifice, both of which were photographed.  From this point the colonoscope was slowly withdrawn, taking circumferential  views of the entire colonic mucosa, stopping in the splenic flexure, where a  small polyp was seen, photographed, and removed using hot biopsy forceps  technique, setting of 20/20 blended current.  The endoscope was then  withdrawn all the way to the rectum, which appeared normal on direct and  showed hemorrhoids on retroflex view.  The endoscope was straightened and  withdrawn.  The patient's vital signs and pulse oximetry remained stable.  The patient tolerated the procedure well without apparent complications.   FINDINGS:  1. Diverticulosis of the sigmoid colon.  2. Internal hemorrhoids.  3. Polyp of right colon.   PLAN:  Await biopsy report.  The patient will call me for results and follow  up with me as an outpatient.                                                Georgiana Spinner, M.D.    GMO/MEDQ  D:  07/25/2002  T:  07/25/2002  Job:  [id number]

## 2011-04-10 NOTE — Op Note (Signed)
NAME:  William [name], William [name]               ACCOUNT NO.:  [id number]   MEDICAL RECORD NO.:  [id number]          PATIENT TYPE:  AMB   LOCATION:  DAY                          [facility]ACILITY:  [facility]   PHYSICIAN:  Georges Lynch. Gioffre, M.D.DATE OF BIRTH:  02/07/1924   DATE OF PROCEDURE:  05/06/2005  DATE OF DISCHARGE:                                 OPERATIVE REPORT   SURGEON:  Ranee Gosselin, M.D.   ASSISTANT:  The nurse.   PREOPERATIVE DIAGNOSES:  1.  Degenerative arthritis right knee.  2.  Complete tear of the medial meniscus of right knee.  3.  Torn lateral meniscus right knee.   POSTOPERATIVE DIAGNOSES:  1.  Degenerative arthritis right knee.  2.  Complete tear of the medial meniscus of right knee.  3.  Torn lateral meniscus right knee.   OPERATION:  1.  Diagnostic arthroscopy right knee.  2.  Medial meniscectomy right knee.  3.  Lateral meniscectomy right knee.  4.  Synovectomy suprapatellar pouch right knee.   PROCEDURE:  Under general anesthesia a routine orthopedic prep and drape of  the right lower extremity is carried out.  He had 1 g of IV Ancef preop.  At  this time a small posterior incision was made in the suprapatellar pouch,  inflow cannula was inserted, knee was distended with saline.  Another small  punctate incision was made in the anterolateral joint.  The arthroscope was  entered from a lateral approach.  I did a complete diagnostic arthroscopy.  Following that, I noted he had a complete disrupted medial meniscus.  I  introduced the shaver suction device in medial approach, did a medial  meniscectomy.  He had complete wear of the tibial plateau as well as the  femoral condyle medially and he had significant degenerative arthritis.  I  went up in the suprapatellar pouch, he had a marked synovitis.  I did the  synovectomy.  Following this, I went down lateral joint and did a partial  lateral meniscectomy.  I thoroughly irrigated out the knee, removed all the  fluid and closed  all three punctate incisions with a 3-0 nylon suture.  I  then injected 30 mL 0.5% Marcaine with epinephrine into the knee joint.  Marcaine 0.5%, 20  mL, and epinephrine into the knee joint.  Sterile neosporin and gentle  dressing was applied.  The patient left the operating room in satisfactory  condition.   ADDENDUM:  He will need a right total knee arthroplasty in the future as  this knee is totally worn out.       RAG/MEDQ  D:  05/06/2005  T:  05/06/2005  Job:  [id number]   cc:   Chart

## 2011-08-24 LAB — BASIC METABOLIC PANEL
BUN: 12
Calcium: 8.7
GFR calc non Af Amer: >60
Glucose, Bld: 147 — ABNORMAL HIGH
Potassium: 3.8

## 2011-08-24 LAB — TYPE AND SCREEN: ABO/RH(D): O POS

## 2011-08-24 LAB — POCT I-STAT, CHEM 8
Calcium, Ion: 1.12
Creatinine, Ser: 0.9
Glucose, Bld: 114 — ABNORMAL HIGH
Hemoglobin: 11.9 — ABNORMAL LOW
TCO2: 26

## 2011-08-24 LAB — DIFFERENTIAL
Basophils Absolute: 0
Lymphocytes Relative: 10 — ABNORMAL LOW
Neutro Abs: 6.6

## 2011-08-24 LAB — CBC
HCT: 30.6 — ABNORMAL LOW
Hemoglobin: 11.1 — ABNORMAL LOW
Platelets: 353
Platelets: 390
RDW: 14.3
RDW: 14.6
WBC: 8.1

## 2011-08-24 LAB — PROTIME-INR
INR: 1.1
Prothrombin Time: 14.5

## 2011-08-24 LAB — APTT: aPTT: 32

## 2011-08-26 LAB — CBC
HCT: 36.8 — ABNORMAL LOW
HCT: 41.7
Hemoglobin: 14.1
Hemoglobin: 8.7 — ABNORMAL LOW
MCHC: 34
MCHC: 34.6
MCHC: 35.2
MCV: 94.6
MCV: 95.1
MCV: 95.9
Platelets: 145 — ABNORMAL LOW
Platelets: 182
RBC: 3.24 — ABNORMAL LOW
RBC: 4.35
RDW: 13.9
RDW: 13.9
RDW: 14.1
WBC: 10.5
WBC: 12.7 — ABNORMAL HIGH
WBC: 6.4

## 2011-08-26 LAB — POCT I-STAT 7, (LYTES, BLD GAS, ICA,H+H)
Bicarbonate: 28.9 — ABNORMAL HIGH
Calcium, Ion: 1.09 — ABNORMAL LOW
HCT: 39
Hemoglobin: 13.3
O2 Saturation: 100
Patient temperature: 35.4
Potassium: 3.6
Potassium: 3.7
Sodium: 134 — ABNORMAL LOW
Sodium: 137
TCO2: 31
pCO2 arterial: 36.4
pH, Arterial: 7.502 — ABNORMAL HIGH

## 2011-08-26 LAB — GLUCOSE, CAPILLARY
Glucose-Capillary: 109 — ABNORMAL HIGH
Glucose-Capillary: 120 — ABNORMAL HIGH
Glucose-Capillary: 129 — ABNORMAL HIGH
Glucose-Capillary: 144 — ABNORMAL HIGH
Glucose-Capillary: 157 — ABNORMAL HIGH
Glucose-Capillary: 169 — ABNORMAL HIGH
Glucose-Capillary: 214 — ABNORMAL HIGH

## 2011-08-26 LAB — COMPREHENSIVE METABOLIC PANEL
ALT: 13
AST: 20
AST: 20
Albumin: 3.9
Calcium: 7.5 — ABNORMAL LOW
Calcium: 9.7
Chloride: 103
Creatinine, Ser: 0.63
GFR calc Af Amer: >60
GFR calc Af Amer: >60
Sodium: 135
Total Protein: 4.3 — ABNORMAL LOW
Total Protein: 6.3

## 2011-08-26 LAB — URINALYSIS, ROUTINE W REFLEX MICROSCOPIC
Bilirubin Urine: NEGATIVE
Glucose, UA: NEGATIVE
Hgb urine dipstick: NEGATIVE
Protein, ur: NEGATIVE
Urobilinogen, UA: 1

## 2011-08-26 LAB — BASIC METABOLIC PANEL
CO2: 29
Calcium: 7.8 — ABNORMAL LOW
Chloride: 102
Chloride: 103
Creatinine, Ser: 0.84
GFR calc Af Amer: >60
Glucose, Bld: 119 — ABNORMAL HIGH
Potassium: 3.5
Sodium: 136
Sodium: 138

## 2011-08-26 LAB — BLOOD GAS, ARTERIAL
Acid-Base Excess: 1.4
Bicarbonate: 26.1 — ABNORMAL HIGH
Drawn by: 181601
pCO2 arterial: 35.8
pCO2 arterial: 43.7
pH, Arterial: 7.391
pO2, Arterial: 120 — ABNORMAL HIGH

## 2011-08-26 LAB — ABO/RH: ABO/RH(D): O POS

## 2011-08-26 LAB — POCT I-STAT 3, ART BLOOD GAS (G3+)
Acid-base deficit: 1
Bicarbonate: 24.1 — ABNORMAL HIGH
O2 Saturation: 93
pO2, Arterial: 72 — ABNORMAL LOW

## 2011-08-26 LAB — TYPE AND SCREEN
ABO/RH(D): O POS
Antibody Screen: NEGATIVE

## 2011-08-26 LAB — PROTIME-INR: Prothrombin Time: 15.5 — ABNORMAL HIGH

## 2011-12-30 DIAGNOSIS — I472 Ventricular tachycardia: Secondary | ICD-10-CM | POA: Diagnosis not present

## 2011-12-30 DIAGNOSIS — I442 Atrioventricular block, complete: Secondary | ICD-10-CM | POA: Diagnosis not present

## 2012-04-08 DIAGNOSIS — I495 Sick sinus syndrome: Secondary | ICD-10-CM | POA: Diagnosis not present

## 2012-04-08 DIAGNOSIS — I442 Atrioventricular block, complete: Secondary | ICD-10-CM | POA: Diagnosis not present

## 2012-04-08 DIAGNOSIS — I719 Aortic aneurysm of unspecified site, without rupture: Secondary | ICD-10-CM | POA: Diagnosis not present

## 2012-04-14 DIAGNOSIS — Z79899 Other long term (current) drug therapy: Secondary | ICD-10-CM | POA: Diagnosis not present

## 2012-04-14 DIAGNOSIS — R5381 Other malaise: Secondary | ICD-10-CM | POA: Diagnosis not present

## 2012-04-14 DIAGNOSIS — E782 Mixed hyperlipidemia: Secondary | ICD-10-CM | POA: Diagnosis not present

## 2012-04-22 DIAGNOSIS — I714 Abdominal aortic aneurysm, without rupture: Secondary | ICD-10-CM | POA: Diagnosis not present

## 2012-05-27 DIAGNOSIS — E7800 Pure hypercholesterolemia, unspecified: Secondary | ICD-10-CM | POA: Diagnosis not present

## 2012-05-27 DIAGNOSIS — Z79899 Other long term (current) drug therapy: Secondary | ICD-10-CM | POA: Diagnosis not present

## 2012-06-02 DIAGNOSIS — L570 Actinic keratosis: Secondary | ICD-10-CM | POA: Diagnosis not present

## 2012-06-02 DIAGNOSIS — E7800 Pure hypercholesterolemia, unspecified: Secondary | ICD-10-CM | POA: Diagnosis not present

## 2012-06-02 DIAGNOSIS — Z1212 Encounter for screening for malignant neoplasm of rectum: Secondary | ICD-10-CM | POA: Diagnosis not present

## 2012-06-02 DIAGNOSIS — I10 Essential (primary) hypertension: Secondary | ICD-10-CM | POA: Diagnosis not present

## 2012-06-02 DIAGNOSIS — Z0000 Encounter for general adult medical examination without abnormal findings: Secondary | ICD-10-CM | POA: Diagnosis not present

## 2012-06-02 DIAGNOSIS — R51 Headache: Secondary | ICD-10-CM | POA: Diagnosis not present

## 2012-06-09 ENCOUNTER — Other Ambulatory Visit: Payer: Self-pay | Admitting: Registered Nurse

## 2012-06-09 DIAGNOSIS — D239 Other benign neoplasm of skin, unspecified: Secondary | ICD-10-CM | POA: Diagnosis not present

## 2012-06-09 DIAGNOSIS — L821 Other seborrheic keratosis: Secondary | ICD-10-CM | POA: Diagnosis not present

## 2012-06-28 DIAGNOSIS — I472 Ventricular tachycardia: Secondary | ICD-10-CM | POA: Diagnosis not present

## 2012-06-28 DIAGNOSIS — I442 Atrioventricular block, complete: Secondary | ICD-10-CM | POA: Diagnosis not present

## 2012-06-30 DIAGNOSIS — H353190 Nonexudative age-related macular degeneration, unspecified eye, stage unspecified: Secondary | ICD-10-CM | POA: Diagnosis not present

## 2012-06-30 DIAGNOSIS — H43819 Vitreous degeneration, unspecified eye: Secondary | ICD-10-CM | POA: Diagnosis not present

## 2012-06-30 DIAGNOSIS — H3530 Unspecified macular degeneration: Secondary | ICD-10-CM | POA: Diagnosis not present

## 2012-06-30 DIAGNOSIS — H02109 Unspecified ectropion of unspecified eye, unspecified eyelid: Secondary | ICD-10-CM | POA: Diagnosis not present

## 2012-09-26 DIAGNOSIS — I442 Atrioventricular block, complete: Secondary | ICD-10-CM | POA: Diagnosis not present

## 2012-09-26 DIAGNOSIS — I472 Ventricular tachycardia: Secondary | ICD-10-CM | POA: Diagnosis not present

## 2012-09-26 LAB — PACEMAKER DEVICE OBSERVATION

## 2012-09-28 ENCOUNTER — Other Ambulatory Visit: Payer: Self-pay | Admitting: Dermatology

## 2012-09-28 DIAGNOSIS — L570 Actinic keratosis: Secondary | ICD-10-CM | POA: Diagnosis not present

## 2012-09-28 DIAGNOSIS — C44211 Basal cell carcinoma of skin of unspecified ear and external auricular canal: Secondary | ICD-10-CM | POA: Diagnosis not present

## 2012-09-28 DIAGNOSIS — D485 Neoplasm of uncertain behavior of skin: Secondary | ICD-10-CM | POA: Diagnosis not present

## 2012-10-06 DIAGNOSIS — C44211 Basal cell carcinoma of skin of unspecified ear and external auricular canal: Secondary | ICD-10-CM | POA: Diagnosis not present

## 2012-12-29 DIAGNOSIS — H43819 Vitreous degeneration, unspecified eye: Secondary | ICD-10-CM | POA: Diagnosis not present

## 2012-12-29 DIAGNOSIS — H353190 Nonexudative age-related macular degeneration, unspecified eye, stage unspecified: Secondary | ICD-10-CM | POA: Diagnosis not present

## 2013-01-13 DIAGNOSIS — I472 Ventricular tachycardia: Secondary | ICD-10-CM | POA: Diagnosis not present

## 2013-01-13 DIAGNOSIS — I442 Atrioventricular block, complete: Secondary | ICD-10-CM | POA: Diagnosis not present

## 2013-02-02 ENCOUNTER — Encounter: Payer: Self-pay | Admitting: *Deleted

## 2013-02-16 ENCOUNTER — Encounter: Payer: Self-pay | Admitting: Cardiovascular Disease

## 2013-04-14 ENCOUNTER — Encounter: Payer: Self-pay | Admitting: Cardiovascular Disease

## 2013-04-14 ENCOUNTER — Ambulatory Visit (INDEPENDENT_AMBULATORY_CARE_PROVIDER_SITE_OTHER): Payer: Medicare Other | Admitting: Cardiovascular Disease

## 2013-04-14 VITALS — BP 132/88 | HR 77 | Resp 16 | Ht 70.0 in | Wt 144.2 lb

## 2013-04-14 DIAGNOSIS — I441 Atrioventricular block, second degree: Secondary | ICD-10-CM

## 2013-04-14 DIAGNOSIS — I10 Essential (primary) hypertension: Secondary | ICD-10-CM

## 2013-04-14 DIAGNOSIS — I472 Ventricular tachycardia: Secondary | ICD-10-CM | POA: Diagnosis not present

## 2013-04-14 DIAGNOSIS — I455 Other specified heart block: Secondary | ICD-10-CM

## 2013-04-14 DIAGNOSIS — I714 Abdominal aortic aneurysm, without rupture: Secondary | ICD-10-CM

## 2013-04-14 DIAGNOSIS — I442 Atrioventricular block, complete: Secondary | ICD-10-CM | POA: Diagnosis not present

## 2013-04-14 DIAGNOSIS — E785 Hyperlipidemia, unspecified: Secondary | ICD-10-CM

## 2013-04-14 DIAGNOSIS — Z950 Presence of cardiac pacemaker: Secondary | ICD-10-CM

## 2013-04-14 LAB — PACEMAKER DEVICE OBSERVATION

## 2013-04-14 MED ORDER — NEBIVOLOL HCL 10 MG PO TABS: 10.0000 mg | ORAL_TABLET | Freq: Every day | ORAL | Status: DC

## 2013-04-14 NOTE — Patient Instructions (Addendum)
Normal device function. No changes were made today. Please do your next pacemaker transmission on 07-17-2013, and then follow up with Dr. Royann Shivers in 1 year. Please contact the office if any problems arise.

## 2013-04-14 NOTE — Progress Notes (Signed)
Pacemaker interrogation in clinic. Normal device function. No complaints made by patient, and no changes in programming.

## 2013-04-17 ENCOUNTER — Encounter: Payer: Self-pay | Admitting: Cardiovascular Disease

## 2013-04-17 DIAGNOSIS — E785 Hyperlipidemia, unspecified: Secondary | ICD-10-CM | POA: Insufficient documentation

## 2013-04-17 DIAGNOSIS — I714 Abdominal aortic aneurysm, without rupture: Secondary | ICD-10-CM | POA: Insufficient documentation

## 2013-04-17 DIAGNOSIS — I442 Atrioventricular block, complete: Secondary | ICD-10-CM | POA: Insufficient documentation

## 2013-04-17 DIAGNOSIS — I455 Other specified heart block: Secondary | ICD-10-CM | POA: Insufficient documentation

## 2013-04-17 DIAGNOSIS — Z950 Presence of cardiac pacemaker: Secondary | ICD-10-CM | POA: Insufficient documentation

## 2013-04-17 DIAGNOSIS — I472 Ventricular tachycardia: Secondary | ICD-10-CM | POA: Insufficient documentation

## 2013-04-17 DIAGNOSIS — I10 Essential (primary) hypertension: Secondary | ICD-10-CM | POA: Insufficient documentation

## 2013-04-17 NOTE — Assessment & Plan Note (Signed)
Good control

## 2013-04-17 NOTE — Assessment & Plan Note (Signed)
Status post surgical repair with aortobifemoral bypass, Dr. Arbie Cookey in 2009. Normal caliber aorta by abdominal ultrasound in May of 2013.

## 2013-04-17 NOTE — Assessment & Plan Note (Signed)
Intolerance to numerous statin drugs

## 2013-04-17 NOTE — Assessment & Plan Note (Signed)
Medtronic REVO, RVDR01 dual chamber, serial # [id number], implanted 05/09/2010. Normal device function. 98% atrial pacing, 98.6% ventricular pacing. No detectable P waves or R waves on current evaluation. Viral cultures 2.9 V. Atrial lead impedance 488 ohms, threshold 1 V at 0.4 seconds pulse width. Ventricular lead impedance 566 ohms, ventricular threshold 1 V at 0.60 seconds pulse width. Lower rate of 70 beats per minute, DDDR upper sensor/tracking rate 120 beats per minute

## 2013-04-17 NOTE — Progress Notes (Signed)
Patient ID: William [name], male   DOB: Sep 06, 1924, 79 y.o.   MRN: [id number]  Reason for office visit  William [name] returns in followup for complete heart block, sinus node dysfunction with sinus node arrest, a symptomatically nonsustained ventricular tachycardia in systemic hypertension.  Since his last appointment he has not had any adverse clinical events. He denies any significant cardiac mass or complaints and is remarkably active for a gentleman his age.  Allergies  Allergen Reactions  . Crestor (Rosuvastatin)     myalgias  . Lipitor (Atorvastatin)     myalgias  . Simvastatin     Myalgias     Current Outpatient Prescriptions  Medication Sig Dispense Refill  . moexipril-hydrochlorothiazide (UNIRETIC) 15-25 MG per tablet Take 1 tablet by mouth daily.      . Multiple Vitamin (MULTIVITAMIN) tablet Take 1 tablet by mouth daily.      . nebivolol (BYSTOLIC) 10 MG tablet Take 1 tablet (10 mg total) by mouth daily.  90 tablet  3   No current facility-administered medications for this visit.    Past Medical History  Diagnosis Date  . Atrioventricular block   . Cardiac pacemaker 05/09/10    medtronic dual chamber  . AAA (abdominal aortic aneurysm)   . Nonsustained ventricular tachycardia   . Dyslipidemia     Past Surgical History  Procedure Laterality Date  . Retinal tear repair cryotherapy    . Meniscus repair      both knees  . Abdominal aortic aneurysm repair  08/02/08    Dr. Arbie Cookey    No family history on file.  History   Social History  . Marital Status: Married    Spouse Name: N/A    Number of Children: N/A  . Years of Education: N/A   Occupational History  . Not on file.   Social History Main Topics  . Smoking status: Former Games developer  . Smokeless tobacco: Former Neurosurgeon    Quit date: 11/22/1974  . Alcohol Use: 3.5 oz/week    7 drink(s) per week  . Drug Use: No  . Sexually Active: Not on file   Other Topics Concern  . Not on file   Social History Narrative   . No narrative on file    Review of systems: The patient specifically denies any chest pain at rest exertion, dyspnea at rest or with exertion, orthopnea, paroxysmal nocturnal dyspnea, syncope, palpitations, focal neurological deficits, intermittent claudication, lower extremity edema, unexplained weight gain, cough, hemoptysis or wheezing. She also denies gastrointestinal bleeding, abdominal pain, nausea, vomiting, dysphagia, polyuria, polydipsia, hematuria, easy bleeding or bruising, mood problems, visual or auditory disturbances  PHYSICAL EXAM BP 132/88  Pulse 77  Resp 16  Ht 5\' 10"  (1.778 m)  Wt 144 lb 3.2 oz (65.409 kg)  BMI 20.69 kg/m2  General: Alert, oriented x3, no distress Head: no evidence of trauma, PERRL, EOMI, no exophtalmos or lid lag, no myxedema, no xanthelasma; normal ears, nose and oropharynx Neck: normal jugular venous pulsations and no hepatojugular reflux; brisk carotid pulses without delay and no carotid bruits Chest: clear to auscultation, no signs of consolidation by percussion or palpation, normal fremitus, symmetrical and full respiratory excursions; healthy subclavian pacemaker site Cardiovascular: normal position and quality of the apical impulse, regular rhythm, normal first and second heart sounds, no murmurs, rubs or gallops Abdomen: no tenderness or distention, no masses by palpation, no abnormal pulsatility or arterial bruits, normal bowel sounds, no hepatosplenomegaly Extremities: no clubbing, cyanosis or edema; 2+ radial, ulnar  and brachial pulses bilaterally; 2+ right femoral, posterior tibial and dorsalis pedis pulses; 2+ left femoral, posterior tibial and dorsalis pedis pulses; no subclavian or femoral bruits. Healthy scar of previous aortobifemoral bypass Neurological: grossly nonfocal   EKG: Atrial paced ventricular paced rhythm    Lipid Panel  No results found for this basename: chol, trig, hdl, cholhdl, vldl, ldlcalc    BMET    Component  Value Date/Time   NA 132* 08/15/2008 1810   K 3.8 08/15/2008 1810   CL 99 08/15/2008 1810   CO2 28 08/15/2008 1810   GLUCOSE 147* 08/15/2008 1810   BUN 12 08/15/2008 1810   CREATININE 0.81 08/15/2008 1810   CALCIUM 8.7 08/15/2008 1810   GFRNONAA >60 08/15/2008 1810   GFRAA  Value: >60        The eGFR has been calculated using the MDRD equation. This calculation has not been validated in all clinical 08/15/2008 1810     ASSESSMENT AND PLAN  Complete heart block He presented 2 years ago with high-grade second degree atrial ventricular block but now has complete heart block and is pacemaker dependent. Normally functioning dual-chamber permanent pacemaker.   Pacemaker Medtronic REVO, RVDR01 dual chamber, serial # [id number], implanted 05/09/2010. Normal device function. 98% atrial pacing, 98.6% ventricular pacing. No detectable P waves or R waves on current evaluation. Viral cultures 2.9 V. Atrial lead impedance 488 ohms, threshold 1 V at 0.4 seconds pulse width. Ventricular lead impedance 566 ohms, ventricular threshold 1 V at 0.60 seconds pulse width. Lower rate of 70 beats per minute, DDDR upper sensor/tracking rate 120 beats per minute  Ventricular tachycardia, nonsustained He has had 3 episodes of nonsustained ventricular tachycardia since his last device check. As before these are very brief, consisting of 67 beats, lasting only about 1 second with rates of 160-180 beats per minute. There consistently asymptomatic. Occurring with roughly similar prevalence since device implantation. He has never experienced syncope. Beta blocker therapy.  Essential hypertension Good control.  AAA (abdominal aortic aneurysm) Status post surgical repair with aortobifemoral bypass, Dr. Arbie Cookey in 2009. Normal caliber aorta by abdominal ultrasound in May of 2013.  Hyperlipidemia Intolerance to numerous statin drugs     William [name]  Thurmon Fair, MD, [facility] [phone] office [phone] pager

## 2013-04-17 NOTE — Assessment & Plan Note (Signed)
He presented 2 years ago with high-grade second degree atrial ventricular block but now has complete heart block and is pacemaker dependent. Normally functioning dual-chamber permanent pacemaker.

## 2013-04-17 NOTE — Assessment & Plan Note (Addendum)
He has had 3 episodes of nonsustained ventricular tachycardia since his last device check. As before these are very brief, consisting of 67 beats, lasting only about 1 second with rates of 160-180 beats per minute. There consistently asymptomatic. Occurring with roughly similar prevalence since device implantation. He has never experienced syncope. Beta blocker therapy.

## 2013-04-19 DIAGNOSIS — Z85828 Personal history of other malignant neoplasm of skin: Secondary | ICD-10-CM | POA: Diagnosis not present

## 2013-04-19 DIAGNOSIS — L570 Actinic keratosis: Secondary | ICD-10-CM | POA: Diagnosis not present

## 2013-04-19 DIAGNOSIS — L821 Other seborrheic keratosis: Secondary | ICD-10-CM | POA: Diagnosis not present

## 2013-05-15 ENCOUNTER — Telehealth: Payer: Self-pay | Admitting: *Deleted

## 2013-05-15 DIAGNOSIS — Z79899 Other long term (current) drug therapy: Secondary | ICD-10-CM

## 2013-05-15 DIAGNOSIS — R5383 Other fatigue: Secondary | ICD-10-CM

## 2013-05-15 DIAGNOSIS — E782 Mixed hyperlipidemia: Secondary | ICD-10-CM

## 2013-05-15 NOTE — Telephone Encounter (Signed)
Message copied by Vita Barley on Mon May 15, 2013  9:19 AM ------      Message from: Loni Dolly C      Created: Wed May 10, 2013  3:47 PM      Regarding: Lab Work      Contact: [phone]       Mr. Olander was in to see Dr. Salena Saner in May.  Dr. Salena Saner wanted him to have fasting glucose and thyroid test.  Patient states he did not get paper work to get it done. He has been waiting for someone to schedule.  ------

## 2013-05-15 NOTE — Telephone Encounter (Signed)
Pt. Stated he needs routine labs along w/TSH.  Printed and faxed to [employer]

## 2013-05-29 ENCOUNTER — Other Ambulatory Visit: Payer: Self-pay | Admitting: Cardiovascular Disease

## 2013-05-29 DIAGNOSIS — R5381 Other malaise: Secondary | ICD-10-CM | POA: Diagnosis not present

## 2013-05-29 DIAGNOSIS — Z79899 Other long term (current) drug therapy: Secondary | ICD-10-CM | POA: Diagnosis not present

## 2013-05-29 DIAGNOSIS — E782 Mixed hyperlipidemia: Secondary | ICD-10-CM | POA: Diagnosis not present

## 2013-05-29 LAB — COMPREHENSIVE METABOLIC PANEL
ALT: 11 U/L (ref 0–53)
AST: 16 U/L (ref 0–37)
Albumin: 4.3 g/dL (ref 3.5–5.2)
CO2: 28 mEq/L (ref 19–32)
Calcium: 9.5 mg/dL (ref 8.4–10.5)
Chloride: 101 mEq/L (ref 96–112)
Potassium: 4.5 mEq/L (ref 3.5–5.3)
Sodium: 139 mEq/L (ref 135–145)
Total Protein: 6.5 g/dL (ref 6.0–8.3)

## 2013-05-29 LAB — TSH: TSH: 4.387 u[IU]/mL (ref 0.350–4.500)

## 2013-05-29 LAB — LIPID PANEL: Cholesterol: 182 mg/dL (ref 0–200)

## 2013-06-08 ENCOUNTER — Telehealth: Payer: Self-pay | Admitting: *Deleted

## 2013-06-08 NOTE — Telephone Encounter (Signed)
LMOM w/lab results.  Told to call if any questions.

## 2013-06-08 NOTE — Telephone Encounter (Signed)
Message copied by Vita Barley on Thu Jun 08, 2013  7:48 AM ------      Message from: Thurmon Fair      Created: Tue Jun 06, 2013  5:21 PM       All labs OK except LDL 120 (target < 100, statin intolerant) ------

## 2013-06-13 ENCOUNTER — Telehealth: Payer: Self-pay | Admitting: Cardiovascular Disease

## 2013-06-13 NOTE — Telephone Encounter (Signed)
You gave him his lab results-would like to have a copy of it. I had started this note he asked for you-Medical Records should do this

## 2013-06-19 DIAGNOSIS — Z79899 Other long term (current) drug therapy: Secondary | ICD-10-CM | POA: Diagnosis not present

## 2013-06-19 DIAGNOSIS — E7800 Pure hypercholesterolemia, unspecified: Secondary | ICD-10-CM | POA: Diagnosis not present

## 2013-06-19 DIAGNOSIS — I10 Essential (primary) hypertension: Secondary | ICD-10-CM | POA: Diagnosis not present

## 2013-06-22 DIAGNOSIS — Z0000 Encounter for general adult medical examination without abnormal findings: Secondary | ICD-10-CM | POA: Diagnosis not present

## 2013-06-23 DIAGNOSIS — R143 Flatulence: Secondary | ICD-10-CM | POA: Diagnosis not present

## 2013-06-23 DIAGNOSIS — R141 Gas pain: Secondary | ICD-10-CM | POA: Diagnosis not present

## 2013-06-23 DIAGNOSIS — I10 Essential (primary) hypertension: Secondary | ICD-10-CM | POA: Diagnosis not present

## 2013-06-23 DIAGNOSIS — E7800 Pure hypercholesterolemia, unspecified: Secondary | ICD-10-CM | POA: Diagnosis not present

## 2013-06-23 DIAGNOSIS — M1990 Unspecified osteoarthritis, unspecified site: Secondary | ICD-10-CM | POA: Diagnosis not present

## 2013-06-23 DIAGNOSIS — Z1212 Encounter for screening for malignant neoplasm of rectum: Secondary | ICD-10-CM | POA: Diagnosis not present

## 2013-07-18 DIAGNOSIS — H3530 Unspecified macular degeneration: Secondary | ICD-10-CM | POA: Diagnosis not present

## 2013-07-18 DIAGNOSIS — H02839 Dermatochalasis of unspecified eye, unspecified eyelid: Secondary | ICD-10-CM | POA: Diagnosis not present

## 2013-07-18 DIAGNOSIS — H02109 Unspecified ectropion of unspecified eye, unspecified eyelid: Secondary | ICD-10-CM | POA: Diagnosis not present

## 2013-09-09 ENCOUNTER — Encounter (HOSPITAL_COMMUNITY): Payer: Self-pay | Admitting: Emergency Medicine

## 2013-09-09 ENCOUNTER — Emergency Department (HOSPITAL_COMMUNITY)
Admission: EM | Admit: 2013-09-09 | Discharge: 2013-09-10 | Disposition: A | Discharge status: Home / Self Care | Payer: Medicare Other | Attending: Emergency Medicine | Admitting: Emergency Medicine

## 2013-09-09 DIAGNOSIS — Z79899 Other long term (current) drug therapy: Secondary | ICD-10-CM | POA: Insufficient documentation

## 2013-09-09 DIAGNOSIS — Y929 Unspecified place or not applicable: Secondary | ICD-10-CM | POA: Insufficient documentation

## 2013-09-09 DIAGNOSIS — Z8639 Personal history of other endocrine, nutritional and metabolic disease: Secondary | ICD-10-CM | POA: Insufficient documentation

## 2013-09-09 DIAGNOSIS — Z862 Personal history of diseases of the blood and blood-forming organs and certain disorders involving the immune mechanism: Secondary | ICD-10-CM | POA: Insufficient documentation

## 2013-09-09 DIAGNOSIS — Z23 Encounter for immunization: Secondary | ICD-10-CM | POA: Insufficient documentation

## 2013-09-09 DIAGNOSIS — Z87891 Personal history of nicotine dependence: Secondary | ICD-10-CM | POA: Diagnosis not present

## 2013-09-09 DIAGNOSIS — Z8679 Personal history of other diseases of the circulatory system: Secondary | ICD-10-CM | POA: Insufficient documentation

## 2013-09-09 DIAGNOSIS — Z950 Presence of cardiac pacemaker: Secondary | ICD-10-CM | POA: Insufficient documentation

## 2013-09-09 DIAGNOSIS — R296 Repeated falls: Secondary | ICD-10-CM | POA: Insufficient documentation

## 2013-09-09 DIAGNOSIS — Y9389 Activity, other specified: Secondary | ICD-10-CM | POA: Insufficient documentation

## 2013-09-09 DIAGNOSIS — S51809A Unspecified open wound of unspecified forearm, initial encounter: Secondary | ICD-10-CM | POA: Diagnosis not present

## 2013-09-09 DIAGNOSIS — S51812A Laceration without foreign body of left forearm, initial encounter: Secondary | ICD-10-CM

## 2013-09-09 MED ORDER — HYDROCODONE-ACETAMINOPHEN 5-325 MG PO TABS: 1.0000 | ORAL_TABLET | Freq: Four times a day (QID) | ORAL | Status: DC | PRN

## 2013-09-09 MED ORDER — CEPHALEXIN 500 MG PO CAPS: 500.0000 mg | ORAL_CAPSULE | Freq: Two times a day (BID) | ORAL | Status: DC

## 2013-09-09 MED ORDER — CEPHALEXIN 250 MG PO CAPS
500.0000 mg | ORAL_CAPSULE | Freq: Once | ORAL | Status: AC
Start: 2013-09-09 — End: 2013-09-10
  Administered 2013-09-10: 500 mg via ORAL
  Filled 2013-09-09: qty 2

## 2013-09-09 MED ORDER — LIDOCAINE-EPINEPHRINE-TETRACAINE (LET) SOLUTION
6.0000 mL | Freq: Once | NASAL | Status: AC
  Administered 2013-09-09: 6 mL via TOPICAL
  Filled 2013-09-09: qty 6

## 2013-09-09 MED ORDER — TETANUS-DIPHTH-ACELL PERTUSSIS 5-2.5-18.5 LF-MCG/0.5 IM SUSP
0.5000 mL | Freq: Once | INTRAMUSCULAR | Status: AC
  Administered 2013-09-09: 0.5 mL via INTRAMUSCULAR
  Filled 2013-09-09: qty 0.5

## 2013-09-09 NOTE — ED Provider Notes (Signed)
CSN: [id number]     Arrival date & time 09/09/13  2220 History   First MD Initiated Contact with Patient 09/09/13 2228     Chief Complaint  Patient presents with  . Abrasion    skin tear   (Consider location/radiation/quality/duration/timing/severity/associated sxs/prior Treatment) HPI  A 79 year old male who is here for evaluations of a skin tear. Patient had a mechanical fall when he got up from his couch, his foot caught on the table and he fell. He injured his left arm on the table.  Incident happened 40 mins ago.  Currently report throbbing sensation to affected area.  No fever, elbow, wrist or fingers pain.  No numbness.  Not on blood thinner medication.  No other complaint. Did not hit head, no LOC, no other injury.    Past Medical History  Diagnosis Date  . Atrioventricular block   . Cardiac pacemaker 05/09/10    medtronic dual chamber  . AAA (abdominal aortic aneurysm)   . Nonsustained ventricular tachycardia   . Dyslipidemia    Past Surgical History  Procedure Laterality Date  . Retinal tear repair cryotherapy    . Meniscus repair      both knees  . Abdominal aortic aneurysm repair  08/02/08    Dr. Arbie Cookey   History reviewed. No pertinent family history. History  Substance Use Topics  . Smoking status: Former Games developer  . Smokeless tobacco: Former Neurosurgeon    Quit date: 11/22/1974  . Alcohol Use: 3.5 oz/week    7 drink(s) per week    Review of Systems  Musculoskeletal: Positive for myalgias.  Skin: Positive for wound.  Neurological: Negative for numbness.    Allergies  Crestor; Lipitor; and Simvastatin  Home Medications   Current Outpatient Rx  Name  Route  Sig  Dispense  Refill  . moexipril-hydrochlorothiazide (UNIRETIC) 15-25 MG per tablet   Oral   Take 1 tablet by mouth daily.         . Multiple Vitamin (MULTIVITAMIN) tablet   Oral   Take 1 tablet by mouth daily.          BP 161/58  Pulse 77  Temp(Src) 98.1 F (36.7 C) (Oral)  Resp 16  Ht 5'  9" (1.753 m)  Wt 148 lb (67.132 kg)  BMI 21.85 kg/m2  SpO2 100% Physical Exam  Nursing note and vitals reviewed. Constitutional: He is oriented to person, place, and time. He appears well-developed and well-nourished. No distress.  HENT:  Head: Atraumatic.  Eyes: Conjunctivae are normal.  Musculoskeletal: He exhibits tenderness (tenderness to L forearm overlying large skin tear).  Neurological: He is alert and oriented to person, place, and time.  Skin:  Pt has a large skin tear to L forearm without fb.  No crepitus, no bone involvement, not actively bleeding.  Psychiatric: He has a normal mood and affect.    ED Course  Procedures (including critical care time)  10:47 PM Pt had a mechanical fall.  Has Large skin tear to L forearm.  No bony involvement.  Has thin skin, not amenable for sutures.  Wound will be cleansed by me, sterile strip, and will apply non adhesive dressing.  Wound care instruction given.  Care discussed with attending.    11:15 PM LET applied to wound, wound irrigated.  Will apply non-occlusive dressing and will d/c with antibiotic.  Pt to f/u with PCP for further care.  Wound care instruction given.  Pt agrees with plan.    Labs Review Labs Reviewed -  No data to display Imaging Review No results found.  EKG Interpretation   None       MDM   1. Skin tear of forearm without complication, left, initial encounter    BP 161/58  Pulse 77  Temp(Src) 98.1 F (36.7 C) (Oral)  Resp 16  Ht 5\' 9"  (1.753 m)  Wt 148 lb (67.132 kg)  BMI 21.85 kg/m2  SpO2 100%      Fayrene Helper, PA-C 09/09/13 2319

## 2013-09-09 NOTE — ED Notes (Signed)
Pt states he got up from the couch and he caught his foot under a table and he fell and got his left arm on the table. Pt has a skin tear to that left arm. Pt denies blood thinners.

## 2013-09-09 NOTE — ED Provider Notes (Signed)
  This was a shared visit with a mid-level provided (NP or PA).  Throughout the patient's course I was available for consultation/collaboration.  On my exam the patient was in no distress.  He had a notable defect, that was not amenable to suture repair, on the forearm.       Gerhard Munch, MD 09/09/13 [phone]

## 2013-09-11 DIAGNOSIS — L98499 Non-pressure chronic ulcer of skin of other sites with unspecified severity: Secondary | ICD-10-CM | POA: Diagnosis not present

## 2013-09-11 DIAGNOSIS — Z85828 Personal history of other malignant neoplasm of skin: Secondary | ICD-10-CM | POA: Diagnosis not present

## 2013-11-07 ENCOUNTER — Encounter: Payer: Self-pay | Admitting: *Deleted

## 2013-11-20 ENCOUNTER — Telehealth: Payer: Self-pay | Admitting: *Deleted

## 2013-11-20 ENCOUNTER — Ambulatory Visit (INDEPENDENT_AMBULATORY_CARE_PROVIDER_SITE_OTHER): Payer: Medicare Other

## 2013-11-20 DIAGNOSIS — I455 Other specified heart block: Secondary | ICD-10-CM | POA: Diagnosis not present

## 2013-11-20 DIAGNOSIS — I472 Ventricular tachycardia: Secondary | ICD-10-CM | POA: Diagnosis not present

## 2013-11-20 DIAGNOSIS — I442 Atrioventricular block, complete: Secondary | ICD-10-CM | POA: Diagnosis not present

## 2013-11-20 LAB — PACEMAKER DEVICE OBSERVATION

## 2013-11-20 NOTE — Telephone Encounter (Signed)
Patient aware that letter was sent due to the lack of a [facility]. Since the [facility] has been sent then patient can follow up as scheduled. Patient voiced understanding.

## 2013-11-20 NOTE — Telephone Encounter (Signed)
Pt received a letter regarding coming in to get his monitor checked. He stated that he just sent his readings in and wanted to know if that was sufficient enough. He is not due till May to come see Dr. Salena Saner so I am not sure what type of letter he received.  MC

## 2013-11-20 NOTE — Telephone Encounter (Signed)
Message forwarded to S. Saunders, CMA r/t device/monitor concerns. 

## 2013-11-28 ENCOUNTER — Encounter: Payer: Self-pay | Admitting: *Deleted

## 2013-11-28 LAB — MDC_IDC_ENUM_SESS_TYPE_REMOTE
Battery Voltage: 2.99 V
Brady Statistic AP VP Percent: 96.13 %
Brady Statistic AP VS Percent: 0.49 %
Brady Statistic AS VP Percent: 3.33 %
Brady Statistic AS VS Percent: 0.05 %
Brady Statistic RA Percent Paced: 96.62 %
Brady Statistic RV Percent Paced: 99.46 %
Date Time Interrogation Session: 20141229152715
Lead Channel Impedance Value: 512 Ohm
Lead Channel Impedance Value: 600 Ohm
Lead Channel Sensing Intrinsic Amplitude: 1.3855
Lead Channel Setting Pacing Amplitude: 2 V
Lead Channel Setting Pacing Amplitude: 2.5 V
Lead Channel Setting Pacing Pulse Width: 0.6 ms
Lead Channel Setting Sensing Sensitivity: 1.5 mV
Zone Setting Detection Interval: 350 ms
Zone Setting Detection Interval: 400 ms

## 2014-02-14 DIAGNOSIS — H00019 Hordeolum externum unspecified eye, unspecified eyelid: Secondary | ICD-10-CM | POA: Diagnosis not present

## 2014-02-19 ENCOUNTER — Encounter: Payer: Medicare Other | Admitting: *Deleted

## 2014-02-27 ENCOUNTER — Encounter: Payer: Self-pay | Admitting: Cardiovascular Disease

## 2014-02-27 ENCOUNTER — Ambulatory Visit (INDEPENDENT_AMBULATORY_CARE_PROVIDER_SITE_OTHER): Payer: Medicare Other | Admitting: *Deleted

## 2014-02-27 DIAGNOSIS — Z950 Presence of cardiac pacemaker: Secondary | ICD-10-CM | POA: Diagnosis not present

## 2014-02-27 DIAGNOSIS — I442 Atrioventricular block, complete: Secondary | ICD-10-CM

## 2014-02-28 LAB — MDC_IDC_ENUM_SESS_TYPE_REMOTE
Battery Voltage: 2.97 V
Brady Statistic AP VP Percent: 96.8 %
Brady Statistic RA Percent Paced: 96.99 %
Brady Statistic RV Percent Paced: 99.8 %
Date Time Interrogation Session: 20150407154952
Lead Channel Impedance Value: 624 Ohm
Lead Channel Setting Pacing Amplitude: 2.5 V
Lead Channel Setting Sensing Sensitivity: 1.5 mV
MDC IDC MSMT LEADCHNL RA IMPEDANCE VALUE: 528 Ohm
MDC IDC MSMT LEADCHNL RA SENSING INTR AMPL: 1.4 mV
MDC IDC SET LEADCHNL RA PACING AMPLITUDE: 2 V
MDC IDC SET LEADCHNL RV PACING PULSEWIDTH: 0.6 ms
MDC IDC SET ZONE DETECTION INTERVAL: 350 ms
MDC IDC STAT BRADY AP VS PERCENT: 0.19 %
MDC IDC STAT BRADY AS VP PERCENT: 2.99 %
MDC IDC STAT BRADY AS VS PERCENT: 0.02 %
Zone Setting Detection Interval: 400 ms

## 2014-03-22 ENCOUNTER — Encounter: Payer: Self-pay | Admitting: *Deleted

## 2014-04-20 ENCOUNTER — Other Ambulatory Visit: Payer: Self-pay | Admitting: Dermatology

## 2014-04-20 DIAGNOSIS — D0460 Carcinoma in situ of skin of unspecified upper limb, including shoulder: Secondary | ICD-10-CM | POA: Diagnosis not present

## 2014-04-20 DIAGNOSIS — Z85828 Personal history of other malignant neoplasm of skin: Secondary | ICD-10-CM | POA: Diagnosis not present

## 2014-04-20 DIAGNOSIS — L570 Actinic keratosis: Secondary | ICD-10-CM | POA: Diagnosis not present

## 2014-04-20 DIAGNOSIS — C44621 Squamous cell carcinoma of skin of unspecified upper limb, including shoulder: Secondary | ICD-10-CM | POA: Diagnosis not present

## 2014-06-28 DIAGNOSIS — Z79899 Other long term (current) drug therapy: Secondary | ICD-10-CM | POA: Diagnosis not present

## 2014-06-28 DIAGNOSIS — E7800 Pure hypercholesterolemia, unspecified: Secondary | ICD-10-CM | POA: Diagnosis not present

## 2014-06-28 DIAGNOSIS — I10 Essential (primary) hypertension: Secondary | ICD-10-CM | POA: Diagnosis not present

## 2014-06-28 DIAGNOSIS — Z0000 Encounter for general adult medical examination without abnormal findings: Secondary | ICD-10-CM | POA: Diagnosis not present

## 2014-07-03 DIAGNOSIS — H6120 Impacted cerumen, unspecified ear: Secondary | ICD-10-CM | POA: Diagnosis not present

## 2014-07-03 DIAGNOSIS — R51 Headache: Secondary | ICD-10-CM | POA: Diagnosis not present

## 2014-07-03 DIAGNOSIS — E7800 Pure hypercholesterolemia, unspecified: Secondary | ICD-10-CM | POA: Diagnosis not present

## 2014-07-03 DIAGNOSIS — I10 Essential (primary) hypertension: Secondary | ICD-10-CM | POA: Diagnosis not present

## 2014-07-03 DIAGNOSIS — Z23 Encounter for immunization: Secondary | ICD-10-CM | POA: Diagnosis not present

## 2014-07-24 DIAGNOSIS — H18519 Endothelial corneal dystrophy, unspecified eye: Secondary | ICD-10-CM | POA: Diagnosis not present

## 2014-07-24 DIAGNOSIS — Z961 Presence of intraocular lens: Secondary | ICD-10-CM | POA: Diagnosis not present

## 2014-07-24 DIAGNOSIS — H02109 Unspecified ectropion of unspecified eye, unspecified eyelid: Secondary | ICD-10-CM | POA: Diagnosis not present

## 2014-07-24 DIAGNOSIS — H3530 Unspecified macular degeneration: Secondary | ICD-10-CM | POA: Diagnosis not present

## 2014-11-27 ENCOUNTER — Ambulatory Visit (INDEPENDENT_AMBULATORY_CARE_PROVIDER_SITE_OTHER): Payer: Medicare Other | Admitting: *Deleted

## 2014-11-27 DIAGNOSIS — I442 Atrioventricular block, complete: Secondary | ICD-10-CM | POA: Diagnosis not present

## 2014-11-27 LAB — MDC_IDC_ENUM_SESS_TYPE_REMOTE
Battery Voltage: 2.97 V
Brady Statistic AP VS Percent: 0.93 %
Date Time Interrogation Session: 20160105164219
Lead Channel Setting Pacing Pulse Width: 0.6 ms
MDC IDC MSMT LEADCHNL RA IMPEDANCE VALUE: 504 Ohm
MDC IDC MSMT LEADCHNL RA SENSING INTR AMPL: 1.2556
MDC IDC MSMT LEADCHNL RV IMPEDANCE VALUE: 560 Ohm
MDC IDC SET LEADCHNL RA PACING AMPLITUDE: 2 V
MDC IDC SET LEADCHNL RV PACING AMPLITUDE: 2.5 V
MDC IDC SET LEADCHNL RV SENSING SENSITIVITY: 1.5 mV
MDC IDC SET ZONE DETECTION INTERVAL: 400 ms
MDC IDC STAT BRADY AP VP PERCENT: 91.13 %
MDC IDC STAT BRADY AS VP PERCENT: 7.67 %
MDC IDC STAT BRADY AS VS PERCENT: 0.27 %
MDC IDC STAT BRADY RA PERCENT PACED: 92.06 %
MDC IDC STAT BRADY RV PERCENT PACED: 98.8 %
Zone Setting Detection Interval: 350 ms

## 2014-11-27 NOTE — Progress Notes (Signed)
Remote pacemaker transmission.   

## 2014-12-03 ENCOUNTER — Encounter: Payer: Self-pay | Admitting: Cardiovascular Disease

## 2014-12-20 ENCOUNTER — Telehealth: Payer: Self-pay | Admitting: *Deleted

## 2014-12-20 NOTE — Telephone Encounter (Signed)
LMTCB//sss 

## 2014-12-20 NOTE — Telephone Encounter (Signed)
Follow.up ° ° ° ° ° °Returning William [name]'s call °

## 2014-12-20 NOTE — Telephone Encounter (Signed)
Informed pt that he had a recall to F/U w/ [facility] in 04-2014 and the appt was not made. Patient states that he tried calling to set up that appt during his recall month but all the slots were filled and someone was supposed to all him closer to Oct when the schedules were coming out to set up an appt, but he never received a call. I apologized for the mix up and assured him that the remotes he's sent over the last few months have come through and been WNL for him. Patient voiced understanding. An appt was made for pt to F/U w/[facility] in 02/2015.

## 2015-03-12 ENCOUNTER — Encounter: Payer: Self-pay | Admitting: Cardiovascular Disease

## 2015-03-12 ENCOUNTER — Ambulatory Visit (INDEPENDENT_AMBULATORY_CARE_PROVIDER_SITE_OTHER): Payer: Medicare Other | Admitting: Cardiovascular Disease

## 2015-03-12 VITALS — BP 128/77 | HR 81 | Ht 69.0 in | Wt 141.2 lb

## 2015-03-12 DIAGNOSIS — I714 Abdominal aortic aneurysm, without rupture, unspecified: Secondary | ICD-10-CM

## 2015-03-12 DIAGNOSIS — Z950 Presence of cardiac pacemaker: Secondary | ICD-10-CM

## 2015-03-12 DIAGNOSIS — I4729 Other ventricular tachycardia: Secondary | ICD-10-CM

## 2015-03-12 DIAGNOSIS — I472 Ventricular tachycardia: Secondary | ICD-10-CM | POA: Diagnosis not present

## 2015-03-12 DIAGNOSIS — I442 Atrioventricular block, complete: Secondary | ICD-10-CM

## 2015-03-12 LAB — MDC_IDC_ENUM_SESS_TYPE_INCLINIC
Battery Voltage: 2.96 V
Brady Statistic AP VS Percent: 0.8 %
Brady Statistic AS VP Percent: 4.4 %
Brady Statistic AS VS Percent: 0.1 %
Lead Channel Impedance Value: 576 Ohm
Lead Channel Pacing Threshold Amplitude: 1 V
Lead Channel Pacing Threshold Amplitude: 1 V
Lead Channel Pacing Threshold Pulse Width: 0.4 ms
Lead Channel Pacing Threshold Pulse Width: 0.6 ms
Lead Channel Setting Pacing Amplitude: 2.5 V
Lead Channel Setting Pacing Pulse Width: 0.6 ms
Lead Channel Setting Sensing Sensitivity: 1.5 mV
MDC IDC MSMT LEADCHNL RA IMPEDANCE VALUE: 456 Ohm
MDC IDC MSMT LEADCHNL RA SENSING INTR AMPL: 1.5 mV
MDC IDC SET LEADCHNL RA PACING AMPLITUDE: 2 V
MDC IDC STAT BRADY AP VP PERCENT: 94.7 %
Zone Setting Detection Interval: 350 ms
Zone Setting Detection Interval: 400 ms

## 2015-03-12 MED ORDER — LISINOPRIL-HYDROCHLOROTHIAZIDE 20-12.5 MG PO TABS: 1.0000 | ORAL_TABLET | Freq: Every day | ORAL | Status: DC

## 2015-03-12 NOTE — Progress Notes (Signed)
Patient ID: William [name], male   DOB: 15-Jun-1924, 81 y.o.   MRN: [id number]      Cardiology Office Note   Date:  03/12/2015   ID:  William [name], DOB 1924/03/18, MRN [id number]  PCP:  Horatio Pel, MD  Cardiologist:   Sanda Klein, MD   Chief Complaint  Patient presents with  . Annual Exam    2 year pacer in office check.  Over the past 2 months has discomfort starting in his back radiating through to his chest after exertion and dizziness.  Discomfort lasts about an hour, dizziness about 1 minute.      History of Present Illness: William [name] is a 81 y.o. male who presents for follow-up for complete heart block as well as sinus node dysfunction with sinus node arrest, dual-chamber permanent pacemaker and recurrent asymptomatic nonsustained ventricular tachycardia and systemic hypertension. He has a history of previous abdominal aortic aneurysm repair with an aortoiliac bypass graft. He has not followed up recently with Dr. Donnetta Hutching . He has a normal nuclear stress test in 2012   Mrs. Porzio continues to feel great and lives independently. He has occasional back pain when he works in the yard. He does not have any back or chest symptoms when walking. Has noticed that when he stops walking he may feel dizzy for a couple of minutes but this resolved spontaneously.  Interrogation of his pacemaker shows 96% atrial pacing and almost 99% ventricular pacing. His device has recorded for episodes of nonsustained ventricular tachycardia with a maximum of 7 beats over the last 3 months.  He has lost several pounds since his last appointment.    Past Medical History  Diagnosis Date  . Atrioventricular block   . Cardiac pacemaker 05/09/10    medtronic dual chamber  . AAA (abdominal aortic aneurysm)   . Nonsustained ventricular tachycardia   . Dyslipidemia     Past Surgical History  Procedure Laterality Date  . Retinal tear repair cryotherapy    . Meniscus repair      both  knees  . Abdominal aortic aneurysm repair  08/02/08    Dr. Donnetta Hutching     Current Outpatient Prescriptions  Medication Sig Dispense Refill  . lisinopril-hydrochlorothiazide (PRINZIDE,ZESTORETIC) 20-12.5 MG per tablet Take 1 tablet by mouth daily. 90 tablet 3  . Multiple Vitamin (MULTIVITAMIN) tablet Take 1 tablet by mouth daily.     No current facility-administered medications for this visit.    Allergies:   Crestor; Lipitor; and Simvastatin    Social History:  The patient  reports that he has quit smoking. He quit smokeless tobacco use about 40 years ago. He reports that he drinks about 3.5 oz of alcohol per week. He reports that he does not use illicit drugs.   Family History:  Is not contributory  ROS:  Please see the history of present illness.    Otherwise, review of systems positive for none.   All other systems are reviewed and negative.    PHYSICAL EXAM: VS:  BP 128/77 mmHg  Pulse 81  Ht 5\' 9"  (1.753 m)  Wt 141 lb 3.2 oz (64.048 kg)  BMI 20.84 kg/m2 , BMI Body mass index is 20.84 kg/(m^2).  General: Alert, oriented x3, no distress Head: no evidence of trauma, PERRL, EOMI, no exophtalmos or lid lag, no myxedema, no xanthelasma; normal ears, nose and oropharynx Neck: normal jugular venous pulsations and no hepatojugular reflux; brisk carotid pulses without delay and no carotid bruits Chest: clear to  auscultation, no signs of consolidation by percussion or palpation, normal fremitus, symmetrical and full respiratory excursions Cardiovascular: normal position and quality of the apical impulse, regular rhythm, normal first and second heart sounds, no murmurs, rubs or gallops Abdomen: no tenderness or distention, no masses by palpation, no abnormal pulsatility or arterial bruits, normal bowel sounds, no hepatosplenomegaly Extremities: no clubbing, cyanosis or edema; 2+ radial, ulnar and brachial pulses bilaterally; 2+ right femoral, posterior tibial and dorsalis pedis pulses; 2+  left femoral, posterior tibial and dorsalis pedis pulses; no subclavian or femoral bruits Neurological: grossly nonfocal Psych: euthymic mood, full affect   EKG:  EKG is ordered today. The ekg ordered today demonstrates AV sequential pacing Recent Labs: No results found for requested labs within last 365 days.    Lipid Panel    Component Value Date/Time   CHOL 182 05/29/2013 0810   TRIG 103 05/29/2013 0810   HDL 39* 05/29/2013 0810   CHOLHDL 4.7 05/29/2013 0810   VLDL 21 05/29/2013 0810   LDLCALC 122* 05/29/2013 0810      Wt Readings from Last 3 Encounters:  03/12/15 141 lb 3.2 oz (64.048 kg)  09/09/13 148 lb (67.132 kg)  04/14/13 144 lb 3.2 oz (65.409 kg)     ASSESSMENT AND PLAN:  1.  Complete heart block - Mr. Chacko is pacemaker dependent and has normal device function. Continue [facility] monitoring every 3 months and office visits yearly.  2. Recurrent asymptomatic nonsustained ventricular tachycardia - in the absence of significant coronary disease or left ventricular dysfunctions is likely to be benign and does not require specific treatment  3. Hypertension - his home blood pressure monitor shows mostly low blood pressure sometimes with systolic in the double digits. I have recommended he reduce his lisinopril hydrochlorothiazide to the 20/12.5 mg dose . 4. Dual-chamber permanent pacemaker with normal function and the presence of post exercise dizziness suggest that his heart rate may be ramping down prematurely after exercise. We prolonged the recovery phase of his sensor driven rate to 15 minutes.   5. Status post aortic aneurysm repair with a aortobifemoral bypass  - repeat duplex ultrasonography . Last previous evaluation was in May 2013    Current medicines are reviewed at length with the patient today.  The patient does not have concerns regarding medicines.  The following changes have been made:  no change  Labs/ tests ordered today include:  Orders Placed This  Encounter  Procedures  . Implantable device check  . EKG 12-Lead  . Abdominal Aortic Aneurysm duplex     Patient Instructions  DECREASE Lisinopril HCTZ to 1/2 tablet daily (20/12.5mg ) .  Your physician has requested that you have an abdominal aorta duplex. During this test, an ultrasound is used to evaluate the aorta. Allow 30 minutes for this exam. Do not eat after midnight the day before and avoid carbonated beverages  Remote monitoring is used to monitor your Pacemaker or ICD from home. This monitoring reduces the number of office visits required to check your device to one time per year. It allows [place] to monitor the functioning of your device to ensure it is working properly. You are scheduled for a device check from home on June 12, 2015. You may send your transmission at any time that day. If you have a wireless device, the transmission will be sent automatically. After your physician reviews your transmission, you will receive a postcard with your next transmission date.  Dr. Sallyanne Kuster recommends that you schedule a follow-up appointment  in: One year.       Mikael Spray, MD  03/12/2015 4:44 PM    Sanda Klein, MD, [facility] [phone] office [phone] pager

## 2015-03-12 NOTE — Patient Instructions (Signed)
DECREASE Lisinopril HCTZ to 1/2 tablet daily (20/12.5mg ) .  Your physician has requested that you have an abdominal aorta duplex. During this test, an ultrasound is used to evaluate the aorta. Allow 30 minutes for this exam. Do not eat after midnight the day before and avoid carbonated beverages  Remote monitoring is used to monitor your Pacemaker or ICD from home. This monitoring reduces the number of office visits required to check your device to one time per year. It allows [place] to monitor the functioning of your device to ensure it is working properly. You are scheduled for a device check from home on June 12, 2015. You may send your transmission at any time that day. If you have a wireless device, the transmission will be sent automatically. After your physician reviews your transmission, you will receive a postcard with your next transmission date.  Dr. Sallyanne Kuster recommends that you schedule a follow-up appointment in: One year.

## 2015-03-19 ENCOUNTER — Ambulatory Visit (HOSPITAL_COMMUNITY)
Admission: RE | Admit: 2015-03-19 | Discharge: 2015-03-19 | Disposition: A | Discharge status: Home / Self Care | Payer: Medicare Other | Source: Ambulatory Visit | Attending: Cardiology | Admitting: Cardiology

## 2015-03-19 ENCOUNTER — Encounter: Payer: Self-pay | Admitting: Cardiovascular Disease

## 2015-03-19 DIAGNOSIS — I714 Abdominal aortic aneurysm, without rupture, unspecified: Secondary | ICD-10-CM

## 2015-03-19 DIAGNOSIS — Z48812 Encounter for surgical aftercare following surgery on the circulatory system: Secondary | ICD-10-CM | POA: Insufficient documentation

## 2015-03-19 NOTE — Progress Notes (Signed)
Abdominal aortic duplex completed. °Brianna L Mazza,RVT °

## 2015-05-22 DIAGNOSIS — T148 Other injury of unspecified body region: Secondary | ICD-10-CM | POA: Diagnosis not present

## 2015-05-24 DIAGNOSIS — S41111S Laceration without foreign body of right upper arm, sequela: Secondary | ICD-10-CM | POA: Diagnosis not present

## 2015-05-27 DIAGNOSIS — T148 Other injury of unspecified body region: Secondary | ICD-10-CM | POA: Diagnosis not present

## 2015-06-11 ENCOUNTER — Ambulatory Visit (INDEPENDENT_AMBULATORY_CARE_PROVIDER_SITE_OTHER): Payer: Medicare Other | Admitting: *Deleted

## 2015-06-11 DIAGNOSIS — I442 Atrioventricular block, complete: Secondary | ICD-10-CM | POA: Diagnosis not present

## 2015-06-11 NOTE — Progress Notes (Signed)
Remote pacemaker transmission.   

## 2015-06-26 LAB — CUP PACEART REMOTE DEVICE CHECK
Battery Voltage: 2.96 V
Brady Statistic AP VS Percent: 0.24 %
Brady Statistic AS VP Percent: 9.83 %
Brady Statistic AS VS Percent: 0.2 %
Brady Statistic RA Percent Paced: 89.97 %
Brady Statistic RV Percent Paced: 99.56 %
Lead Channel Impedance Value: 560 Ohm
Lead Channel Sensing Intrinsic Amplitude: 1.429 mV
Lead Channel Setting Pacing Amplitude: 2 V
Lead Channel Setting Pacing Amplitude: 2.5 V
Lead Channel Setting Pacing Pulse Width: 0.6 ms
MDC IDC MSMT LEADCHNL RA IMPEDANCE VALUE: 488 Ohm
MDC IDC SESS DTM: 20160719145516
MDC IDC SET LEADCHNL RV SENSING SENSITIVITY: 1.5 mV
MDC IDC STAT BRADY AP VP PERCENT: 89.73 %
Zone Setting Detection Interval: 350 ms
Zone Setting Detection Interval: 400 ms

## 2015-07-03 ENCOUNTER — Encounter: Payer: Self-pay | Admitting: Cardiology

## 2015-07-10 ENCOUNTER — Encounter: Payer: Self-pay | Admitting: Cardiovascular Disease

## 2015-07-30 DIAGNOSIS — H31002 Unspecified chorioretinal scars, left eye: Secondary | ICD-10-CM | POA: Diagnosis not present

## 2015-07-30 DIAGNOSIS — H43813 Vitreous degeneration, bilateral: Secondary | ICD-10-CM | POA: Diagnosis not present

## 2015-07-30 DIAGNOSIS — Z0100 Encounter for examination of eyes and vision without abnormal findings: Secondary | ICD-10-CM | POA: Diagnosis not present

## 2015-07-30 DIAGNOSIS — H3531 Nonexudative age-related macular degeneration: Secondary | ICD-10-CM | POA: Diagnosis not present

## 2015-08-13 DIAGNOSIS — E780 Pure hypercholesterolemia: Secondary | ICD-10-CM | POA: Diagnosis not present

## 2015-08-13 DIAGNOSIS — I10 Essential (primary) hypertension: Secondary | ICD-10-CM | POA: Diagnosis not present

## 2015-08-19 DIAGNOSIS — Z1212 Encounter for screening for malignant neoplasm of rectum: Secondary | ICD-10-CM | POA: Diagnosis not present

## 2015-08-19 DIAGNOSIS — I10 Essential (primary) hypertension: Secondary | ICD-10-CM | POA: Diagnosis not present

## 2015-08-19 DIAGNOSIS — E780 Pure hypercholesterolemia: Secondary | ICD-10-CM | POA: Diagnosis not present

## 2015-08-19 DIAGNOSIS — Z23 Encounter for immunization: Secondary | ICD-10-CM | POA: Diagnosis not present

## 2015-08-19 DIAGNOSIS — N401 Enlarged prostate with lower urinary tract symptoms: Secondary | ICD-10-CM | POA: Diagnosis not present

## 2015-08-19 DIAGNOSIS — M1990 Unspecified osteoarthritis, unspecified site: Secondary | ICD-10-CM | POA: Diagnosis not present

## 2015-09-16 ENCOUNTER — Ambulatory Visit (INDEPENDENT_AMBULATORY_CARE_PROVIDER_SITE_OTHER): Payer: Medicare Other | Admitting: *Deleted

## 2015-09-16 DIAGNOSIS — I442 Atrioventricular block, complete: Secondary | ICD-10-CM | POA: Diagnosis not present

## 2015-09-16 NOTE — Progress Notes (Signed)
Remote pacemaker transmission.   

## 2015-09-17 LAB — CUP PACEART REMOTE DEVICE CHECK
Battery Voltage: 2.95 V
Brady Statistic RA Percent Paced: 90.69 %
Brady Statistic RV Percent Paced: 99.58 %
Date Time Interrogation Session: 20161024145815
Implantable Lead Implant Date: 20110617
Implantable Lead Implant Date: 20110617
Implantable Lead Location: 753859
Implantable Lead Location: 753860
Implantable Lead Model: 5086
Implantable Lead Model: 5086
Lead Channel Impedance Value: 592 Ohm
Lead Channel Sensing Intrinsic Amplitude: 1.039 mV
Lead Channel Setting Pacing Amplitude: 2 V
Lead Channel Setting Sensing Sensitivity: 1.5 mV
MDC IDC MSMT LEADCHNL RA IMPEDANCE VALUE: 488 Ohm
MDC IDC SET LEADCHNL RV PACING AMPLITUDE: 2.5 V
MDC IDC SET LEADCHNL RV PACING PULSEWIDTH: 0.6 ms
MDC IDC STAT BRADY AP VP PERCENT: 90.43 %
MDC IDC STAT BRADY AP VS PERCENT: 0.26 %
MDC IDC STAT BRADY AS VP PERCENT: 9.15 %
MDC IDC STAT BRADY AS VS PERCENT: 0.16 %

## 2015-09-18 ENCOUNTER — Encounter: Payer: Self-pay | Admitting: Cardiology

## 2015-12-05 DIAGNOSIS — G500 Trigeminal neuralgia: Secondary | ICD-10-CM | POA: Diagnosis not present

## 2015-12-05 DIAGNOSIS — G501 Atypical facial pain: Secondary | ICD-10-CM | POA: Diagnosis not present

## 2015-12-16 ENCOUNTER — Encounter: Payer: Medicare Other | Admitting: *Deleted

## 2015-12-18 ENCOUNTER — Encounter: Payer: Self-pay | Admitting: Cardiology

## 2015-12-23 ENCOUNTER — Ambulatory Visit (INDEPENDENT_AMBULATORY_CARE_PROVIDER_SITE_OTHER): Payer: Medicare Other | Admitting: *Deleted

## 2015-12-23 DIAGNOSIS — I442 Atrioventricular block, complete: Secondary | ICD-10-CM | POA: Diagnosis not present

## 2015-12-24 NOTE — Progress Notes (Signed)
Remote pacemaker transmission.   

## 2015-12-31 LAB — CUP PACEART REMOTE DEVICE CHECK
Battery Voltage: 2.95 V
Brady Statistic AP VP Percent: 86.23 %
Brady Statistic AS VS Percent: 0.22 %
Implantable Lead Implant Date: 20110617
Implantable Lead Implant Date: 20110617
Implantable Lead Location: 753860
Implantable Lead Model: 5086
Lead Channel Impedance Value: 528 Ohm
Lead Channel Impedance Value: 616 Ohm
Lead Channel Setting Pacing Amplitude: 2 V
Lead Channel Setting Sensing Sensitivity: 1.5 mV
MDC IDC LEAD LOCATION: 753859
MDC IDC LEAD MODEL: 5086
MDC IDC MSMT LEADCHNL RA SENSING INTR AMPL: 1.515 mV
MDC IDC SESS DTM: 20170130162032
MDC IDC SET LEADCHNL RV PACING AMPLITUDE: 2.5 V
MDC IDC SET LEADCHNL RV PACING PULSEWIDTH: 0.6 ms
MDC IDC STAT BRADY AP VS PERCENT: 0.22 %
MDC IDC STAT BRADY AS VP PERCENT: 13.33 %
MDC IDC STAT BRADY RA PERCENT PACED: 86.45 %
MDC IDC STAT BRADY RV PERCENT PACED: 99.56 %

## 2016-01-01 ENCOUNTER — Encounter: Payer: Self-pay | Admitting: Cardiology

## 2016-01-03 ENCOUNTER — Encounter: Payer: Self-pay | Admitting: Cardiology

## 2016-01-15 DIAGNOSIS — J322 Chronic ethmoidal sinusitis: Secondary | ICD-10-CM | POA: Diagnosis not present

## 2016-01-15 DIAGNOSIS — J320 Chronic maxillary sinusitis: Secondary | ICD-10-CM | POA: Diagnosis not present

## 2016-01-15 DIAGNOSIS — H6121 Impacted cerumen, right ear: Secondary | ICD-10-CM | POA: Diagnosis not present

## 2016-01-15 DIAGNOSIS — J040 Acute laryngitis: Secondary | ICD-10-CM | POA: Diagnosis not present

## 2016-01-21 ENCOUNTER — Telehealth: Payer: Self-pay | Admitting: Cardiovascular Disease

## 2016-01-21 NOTE — Telephone Encounter (Signed)
Informed pt that his remote transmission results was normal and no episodes. Pt verbalized understanding.

## 2016-01-21 NOTE — Telephone Encounter (Signed)
New message  Pt called states that he received a letter that the remote test results were received. But it didn't give the results. Please call back with those results from 12/23/2015

## 2016-02-25 DIAGNOSIS — M25511 Pain in right shoulder: Secondary | ICD-10-CM | POA: Diagnosis not present

## 2016-02-25 DIAGNOSIS — M19011 Primary osteoarthritis, right shoulder: Secondary | ICD-10-CM | POA: Diagnosis not present

## 2016-03-05 ENCOUNTER — Other Ambulatory Visit: Payer: Self-pay | Admitting: Cardiovascular Disease

## 2016-03-05 NOTE — Telephone Encounter (Signed)
Rx(s) sent to pharmacy electronically.  

## 2016-03-12 DIAGNOSIS — S0990XA Unspecified injury of head, initial encounter: Secondary | ICD-10-CM | POA: Diagnosis not present

## 2016-03-12 DIAGNOSIS — W19XXXA Unspecified fall, initial encounter: Secondary | ICD-10-CM | POA: Diagnosis not present

## 2016-03-12 DIAGNOSIS — S0101XA Laceration without foreign body of scalp, initial encounter: Secondary | ICD-10-CM | POA: Diagnosis not present

## 2016-03-24 ENCOUNTER — Ambulatory Visit (INDEPENDENT_AMBULATORY_CARE_PROVIDER_SITE_OTHER): Payer: Medicare Other | Admitting: Cardiovascular Disease

## 2016-03-24 ENCOUNTER — Encounter: Payer: Self-pay | Admitting: Cardiovascular Disease

## 2016-03-24 VITALS — BP 141/91 | HR 76 | Ht 68.0 in | Wt 142.6 lb

## 2016-03-24 DIAGNOSIS — I714 Abdominal aortic aneurysm, without rupture, unspecified: Secondary | ICD-10-CM

## 2016-03-24 DIAGNOSIS — Z950 Presence of cardiac pacemaker: Secondary | ICD-10-CM

## 2016-03-24 DIAGNOSIS — I10 Essential (primary) hypertension: Secondary | ICD-10-CM | POA: Diagnosis not present

## 2016-03-24 DIAGNOSIS — I472 Ventricular tachycardia: Secondary | ICD-10-CM

## 2016-03-24 DIAGNOSIS — E785 Hyperlipidemia, unspecified: Secondary | ICD-10-CM | POA: Diagnosis not present

## 2016-03-24 DIAGNOSIS — I442 Atrioventricular block, complete: Secondary | ICD-10-CM

## 2016-03-24 DIAGNOSIS — I4729 Other ventricular tachycardia: Secondary | ICD-10-CM

## 2016-03-24 DIAGNOSIS — I455 Other specified heart block: Secondary | ICD-10-CM | POA: Diagnosis not present

## 2016-03-24 NOTE — Patient Instructions (Signed)
Your physician recommends that you continue on your current medications as directed. Please refer to the Current Medication list given to you today.  Remote monitoring is used to monitor your Pacemaker of ICD from home. This monitoring reduces the number of office visits required to check your device to one time per year. It allows [place] to keep an eye on the functioning of your device to ensure it is working properly. You are scheduled for a device check from home on June 25, 2016. You may send your transmission at any time that day. If you have a wireless device, the transmission will be sent automatically. After your physician reviews your transmission, you will receive a postcard with your next transmission date.  Your physician wants you to follow-up in: [place]. You will receive a reminder letter in the mail two months in advance. If you don't receive a letter, please call our office to schedule the follow-up appointment.

## 2016-03-24 NOTE — Progress Notes (Signed)
Patient ID: [name], male   DOB: [birth year], 83 y.o.   MRN: [id number]    Cardiology Office Note    Date:  03/24/2016   ID:  William [name], DOB 1924/09/13, MRN [id number]  PCP:  William Pel, MD  Cardiologist:   William Klein, MD   Chief Complaint  Patient presents with  . Annual Exam    patient reports no problems.    History of Present Illness:  William [name] is a 82 y.o. male with sinus node arrest and complete heart block here for follow-up on his dual-chamber permanent pacemaker (Medtronic Revo implanted June 2011), as well as for follow-up of hypertension, hyperlipidemia and abdominal aortic aneurysm status post surgical repair.  He feels well and continues to live independently taking care of all his housework and yardwork. He denies any angina or dyspnea with activity although he has occasional back discomfort. He denies syncope and is not aware of palpitations. His typical blood pressure at home is around 132/85 on the average. He denies claudication, bleeding, focal neurological events, leg edema, visual disturbances, abdominal pain or change in bowel pattern, intolerance to heat or cold.  Pacemaker interrogation shows normal device function. His presenting rhythm is AV sequential paced. He has 88% atrial pacing and 99.5% ventricular pacing. Heart rate histogram distribution appears favorable, activity level is constant at around 4.6 hours a day over the last year. Lead parameters are unchanged.  Abdominal aorta ultrasound performed exactly a year ago showed no evidence of aneurysm (recall repair with aortobiiliac bypass graft).  Past Medical History  Diagnosis Date  . Atrioventricular block   . Cardiac pacemaker 05/09/10    medtronic dual chamber  . AAA (abdominal aortic aneurysm) ([place])   . Nonsustained ventricular tachycardia ([place])   . Dyslipidemia     Past Surgical History  Procedure Laterality Date  . Retinal tear repair cryotherapy    . Meniscus  repair      both knees  . Abdominal aortic aneurysm repair  08/02/08    Dr. Donnetta Hutching    Current Medications: Outpatient Prescriptions Prior to Visit  Medication Sig Dispense Refill  . lisinopril-hydrochlorothiazide (PRINZIDE,ZESTORETIC) 20-12.5 MG tablet TAKE 1 TABLET BY MOUTH DAILY. 90 tablet 0  . Multiple Vitamin (MULTIVITAMIN) tablet Take 1 tablet by mouth daily.     No facility-administered medications prior to visit.     Allergies:   Crestor; Lipitor; and Simvastatin   Social History   Social History  . Marital Status: Married    Spouse Name: N/A  . Number of Children: N/A  . Years of Education: N/A   Social History Main Topics  . Smoking status: Former Research scientist (life sciences)  . Smokeless tobacco: Former Systems developer    Quit date: 11/22/1974  . Alcohol Use: 4.2 oz/week    7 Standard drinks or equivalent per week  . Drug Use: No  . Sexual Activity: Not Asked   Other Topics Concern  . None   Social History Narrative       ROS:   Please see the history of present illness.    ROS All other systems reviewed and are negative.   PHYSICAL EXAM:   VS:  BP 141/91 mmHg  Pulse 76  Ht 5\' 8"  (1.727 m)  Wt 64.683 kg (142 lb 9.6 oz)  BMI 21.69 kg/m2   GEN: Well nourished, well developed, in no acute distress HEENT: normal Neck: no JVD, carotid bruits, or masses Cardiac: RRR; no murmurs, rubs, or gallops,no edema , healthy left subclavian  pacemaker site Respiratory:  clear to auscultation bilaterally, normal work of breathing GI: soft, nontender, nondistended, + BS MS: no deformity or atrophy Skin: warm and dry, no rash Neuro:  Alert and Oriented x 3, Strength and sensation are intact Psych: euthymic mood, full affect  Wt Readings from Last 3 Encounters:  03/24/16 64.683 kg (142 lb 9.6 oz)  03/12/15 64.048 kg (141 lb 3.2 oz)  09/09/13 67.132 kg (148 lb)      Studies/Labs Reviewed:   EKG:  EKG is ordered today.  The ekg ordered today demonstrates Atrioventricular sequential  pacing  Recent Labs: No results found for requested labs within last 365 days.   Lipid Panel    Component Value Date/Time   CHOL 182 05/29/2013 0810   TRIG 103 05/29/2013 0810   HDL 39* 05/29/2013 0810   CHOLHDL 4.7 05/29/2013 0810   VLDL 21 05/29/2013 0810   LDLCALC 122* 05/29/2013 0810     ASSESSMENT:    1. Complete heart block ([place])   2. Atrial standstill   3. Pacemaker   4. Ventricular tachycardia, nonsustained ([place])   5. Essential hypertension   6. Hyperlipidemia   7. Abdominal aortic aneurysm (AAA) without rupture ([facility])      PLAN:  In order of problems listed above:  1. CHB: Pacemaker dependent 2. SSS: At many times when we check his device there is no underlying atrial rhythm, but he does have native atrial activation roughly 12% of the time. Heart rate histogram distribution suggests that his current sensor settings are appropriate 3. PPM: Normal device function, remote download every 3 months and yearly office visit. He has an MRI conditional device 4. NSVT: similar to previous device downloads he has had occasional runs of ventricular tachycardia. These are consistently asymptomatic. One episode occurred on February 7 at around 6:00 in the evening and was fairly lengthy consisting of 15 beats at around 200 bpm. Most recent episode occurred around 1 AM on April 29 at 176 bpm. Specific therapy is not indicated 5. HTN: Borderline elevated blood pressure today, better at home. No changes recommended 6. AAA s/p Aobifem: No evidence of recurrent aneurysm, no symptoms of claudication, normal distal pulses. Will increase frequency of ultrasound monitoring to every other year    Medication Adjustments/Labs and Tests Ordered: Current medicines are reviewed at length with the patient today.  Concerns regarding medicines are outlined above.  Medication changes, Labs and Tests ordered today are listed in the Patient Instructions below. Patient Instructions  Your physician  recommends that you continue on your current medications as directed. Please refer to the Current Medication list given to you today.  Remote monitoring is used to monitor your Pacemaker of ICD from home. This monitoring reduces the number of office visits required to check your device to one time per year. It allows [place] to keep an eye on the functioning of your device to ensure it is working properly. You are scheduled for a device check from home on June 25, 2016. You may send your transmission at any time that day. If you have a wireless device, the transmission will be sent automatically. After your physician reviews your transmission, you will receive a postcard with your next transmission date.  Your physician wants you to follow-up in: [place]. You will receive a reminder letter in the mail two months in advance. If you don't receive a letter, please call our office to schedule the follow-up appointment.  Mikael Spray, MD  03/24/2016 1:55 PM    [facility] [address], [place], [place]  [address] Phone: [phone]; Fax: [phone]

## 2016-03-30 ENCOUNTER — Other Ambulatory Visit: Payer: Self-pay | Admitting: Cardiovascular Disease

## 2016-04-02 ENCOUNTER — Other Ambulatory Visit: Payer: Self-pay | Admitting: Cardiovascular Disease

## 2016-04-02 DIAGNOSIS — I714 Abdominal aortic aneurysm, without rupture, unspecified: Secondary | ICD-10-CM

## 2016-04-02 DIAGNOSIS — I739 Peripheral vascular disease, unspecified: Secondary | ICD-10-CM

## 2016-04-08 ENCOUNTER — Other Ambulatory Visit: Payer: Self-pay | Admitting: Cardiovascular Disease

## 2016-04-08 ENCOUNTER — Ambulatory Visit (HOSPITAL_COMMUNITY)
Admission: RE | Admit: 2016-04-08 | Discharge: 2016-04-08 | Disposition: A | Discharge status: Home / Self Care | Payer: Medicare Other | Source: Ambulatory Visit | Attending: Cardiovascular Disease | Admitting: Cardiovascular Disease

## 2016-04-08 DIAGNOSIS — Z87891 Personal history of nicotine dependence: Secondary | ICD-10-CM | POA: Insufficient documentation

## 2016-04-08 DIAGNOSIS — I714 Abdominal aortic aneurysm, without rupture, unspecified: Secondary | ICD-10-CM

## 2016-04-08 DIAGNOSIS — I70203 Unspecified atherosclerosis of native arteries of extremities, bilateral legs: Secondary | ICD-10-CM | POA: Diagnosis not present

## 2016-04-08 DIAGNOSIS — I700 Atherosclerosis of aorta: Secondary | ICD-10-CM | POA: Insufficient documentation

## 2016-04-08 DIAGNOSIS — Z95828 Presence of other vascular implants and grafts: Secondary | ICD-10-CM | POA: Insufficient documentation

## 2016-04-08 DIAGNOSIS — I10 Essential (primary) hypertension: Secondary | ICD-10-CM | POA: Diagnosis not present

## 2016-04-08 DIAGNOSIS — E785 Hyperlipidemia, unspecified: Secondary | ICD-10-CM | POA: Insufficient documentation

## 2016-04-08 DIAGNOSIS — I739 Peripheral vascular disease, unspecified: Secondary | ICD-10-CM | POA: Diagnosis not present

## 2016-04-11 LAB — CUP PACEART REMOTE DEVICE CHECK
Battery Voltage: 2.93 V
Brady Statistic AP VP Percent: 87.92 %
Brady Statistic AP VS Percent: 0.26 %
Brady Statistic AS VP Percent: 11.63 %
Brady Statistic AS VS Percent: 0.19 %
Brady Statistic RA Percent Paced: 88.18 %
Brady Statistic RV Percent Paced: 99.55 %
Date Time Interrogation Session: 20170502161153
Implantable Lead Implant Date: 20110617
Implantable Lead Implant Date: 20110617
Implantable Lead Location: 753859
Implantable Lead Location: 753860
Implantable Lead Model: 5086
Implantable Lead Model: 5086
Lead Channel Impedance Value: 448 Ohm
Lead Channel Impedance Value: 576 Ohm
Lead Channel Sensing Intrinsic Amplitude: 1.342 mV
Lead Channel Setting Pacing Amplitude: 2 V
Lead Channel Setting Pacing Amplitude: 2.5 V
Lead Channel Setting Pacing Pulse Width: 0.6 ms
Lead Channel Setting Sensing Sensitivity: 1.5 mV

## 2016-04-15 ENCOUNTER — Telehealth: Payer: Self-pay | Admitting: Cardiovascular Disease

## 2016-04-15 DIAGNOSIS — C44219 Basal cell carcinoma of skin of left ear and external auricular canal: Secondary | ICD-10-CM | POA: Diagnosis not present

## 2016-04-15 DIAGNOSIS — Z85828 Personal history of other malignant neoplasm of skin: Secondary | ICD-10-CM | POA: Diagnosis not present

## 2016-04-15 MED ORDER — LISINOPRIL-HYDROCHLOROTHIAZIDE 20-25 MG PO TABS: 1.0000 | ORAL_TABLET | Freq: Every day | ORAL | Status: DC

## 2016-04-15 NOTE — Telephone Encounter (Signed)
New message     Pt C/O BP issue: STAT if pt C/O blurred vision, one-sided weakness or slurred speech  1. What are your last 5 BP readings? Today 150/95   2. Are you having any other symptoms (ex. Dizziness, headache, blurred vision, passed out)?  lightheaded - strange feeling   3. What is your BP issue? Would like to be seen / discuss with nurse

## 2016-04-15 NOTE — Telephone Encounter (Signed)
PATIENT C/O OF FELLING DIFFERENT TODAY  PRESSURE TOP OF HIS HEAD  ,LIGHTHEADNESS  NOT DIZZY PER PATIENT  FEELS UNSTEADY TO WALK - NO BLURRY  VISION, NO SINUS CONGESTION  NO ALLERGIES , NO CHEST PAIN  BLOOD PRESSURE FOR THE LAST 3 DAYS RANGING 150"S /90'S  PATIENT STATES HE NOT CONCERN ABOUT BLOOD PRESSURE BUT THE PRESSURE HE HAS IN HIS HEAD  PATIEN IS ONLY TAKING LISINOPRIL-HCTZ.  PATIENT STATES HE NOW TAKING MEDICATION @8 -9 AM EACH MORNING.  PATIENT HAS NOT CONTACT PRIMARY ABOUT THIS ISSUE. AWARE WILL DEFER TO WITH DR Sallyanne Kuster

## 2016-04-15 NOTE — Telephone Encounter (Signed)
That degree of blood pressure elevation is abnormal, but unlikely to cause much in the way of symptoms. Increase to lisinopril-HCTZ 20-25, but will take about 2 weeks to see the full effect on his blood pressure. If symptoms worsen, should probably be seen in urgent care/ER to make sure he is not having any neurological problems

## 2016-04-15 NOTE — Telephone Encounter (Signed)
SPOKE TO PATIENT  INFORMATION GIVEN VERBALIZED UNDERSTANDING  E-SENT NEW PRESCRIPTION

## 2016-04-16 DIAGNOSIS — Z87891 Personal history of nicotine dependence: Secondary | ICD-10-CM | POA: Diagnosis not present

## 2016-04-16 DIAGNOSIS — Z9181 History of falling: Secondary | ICD-10-CM | POA: Diagnosis not present

## 2016-04-16 DIAGNOSIS — R51 Headache: Secondary | ICD-10-CM | POA: Diagnosis not present

## 2016-04-16 DIAGNOSIS — R2681 Unsteadiness on feet: Secondary | ICD-10-CM | POA: Diagnosis not present

## 2016-04-16 DIAGNOSIS — R2689 Other abnormalities of gait and mobility: Secondary | ICD-10-CM | POA: Diagnosis not present

## 2016-04-16 DIAGNOSIS — I4891 Unspecified atrial fibrillation: Secondary | ICD-10-CM | POA: Diagnosis not present

## 2016-04-16 DIAGNOSIS — R42 Dizziness and giddiness: Secondary | ICD-10-CM | POA: Diagnosis not present

## 2016-04-16 DIAGNOSIS — I10 Essential (primary) hypertension: Secondary | ICD-10-CM | POA: Diagnosis not present

## 2016-04-16 DIAGNOSIS — Z79899 Other long term (current) drug therapy: Secondary | ICD-10-CM | POA: Diagnosis not present

## 2016-04-16 DIAGNOSIS — I6521 Occlusion and stenosis of right carotid artery: Secondary | ICD-10-CM | POA: Diagnosis not present

## 2016-04-16 DIAGNOSIS — R911 Solitary pulmonary nodule: Secondary | ICD-10-CM | POA: Diagnosis not present

## 2016-04-16 DIAGNOSIS — Z950 Presence of cardiac pacemaker: Secondary | ICD-10-CM | POA: Diagnosis not present

## 2016-04-22 ENCOUNTER — Encounter: Payer: Self-pay | Admitting: Cardiovascular Disease

## 2016-04-27 DIAGNOSIS — H43813 Vitreous degeneration, bilateral: Secondary | ICD-10-CM | POA: Diagnosis not present

## 2016-04-27 DIAGNOSIS — H353132 Nonexudative age-related macular degeneration, bilateral, intermediate dry stage: Secondary | ICD-10-CM | POA: Diagnosis not present

## 2016-04-27 DIAGNOSIS — H01001 Unspecified blepharitis right upper eyelid: Secondary | ICD-10-CM | POA: Diagnosis not present

## 2016-04-27 DIAGNOSIS — H5213 Myopia, bilateral: Secondary | ICD-10-CM | POA: Diagnosis not present

## 2016-04-30 DIAGNOSIS — R5383 Other fatigue: Secondary | ICD-10-CM | POA: Diagnosis not present

## 2016-04-30 DIAGNOSIS — R51 Headache: Secondary | ICD-10-CM | POA: Diagnosis not present

## 2016-04-30 DIAGNOSIS — R2689 Other abnormalities of gait and mobility: Secondary | ICD-10-CM | POA: Diagnosis not present

## 2016-04-30 DIAGNOSIS — H905 Unspecified sensorineural hearing loss: Secondary | ICD-10-CM | POA: Diagnosis not present

## 2016-04-30 DIAGNOSIS — I6521 Occlusion and stenosis of right carotid artery: Secondary | ICD-10-CM | POA: Diagnosis not present

## 2016-05-05 ENCOUNTER — Encounter: Payer: Self-pay | Admitting: Cardiovascular Disease

## 2016-05-05 ENCOUNTER — Telehealth: Payer: Self-pay | Admitting: Cardiovascular Disease

## 2016-05-05 NOTE — Telephone Encounter (Signed)
Please always get a PHONE number, NAME of the facility, NAME of the contact person and NAME of the physician performing or supervising the procedure for any such MRI or surgical procedure requests. That makes it much easier for [place] to call them with questions and route the paperwork to the appropriate location. A simple fax number for the nameless "MRI tech" is inadequate information. Thank you

## 2016-05-05 NOTE — Telephone Encounter (Signed)
Thanks, Ovid Curd. Can we get that request to our whole clinical pool that handles these types of calls, please? MCr

## 2016-05-05 NOTE — Telephone Encounter (Signed)
A physician will not be doing it. A MRI Tech will do this,at this time she does not the name of the MRI Tech.Dr Cammy Brochure is there ordering physician with [facility].

## 2016-05-05 NOTE — Telephone Encounter (Signed)
New Message  Request for surgical clearance:  1. What type of surgery is being performed? MRI   2. When is this surgery scheduled? Haven't scheduled yet awaiting clearance    3. Name of physician performing surgery? MRI tech   4. What is your office phone and fax number?  [phone]  Comments: pt has a pacer and request clearance from provider has already checked with [employer] and it is safe, however a letter is needed from the provider please send ASAP.

## 2016-05-05 NOTE — Telephone Encounter (Addendum)
Per phone notes, this is from William [name] from [facility]. Phone number is listed.   Have left msg w/ William [name] for request of physician information.

## 2016-05-05 NOTE — Telephone Encounter (Signed)
   Request for surgical clearance:  1. What type of procedure is being performed? MRI at [facility]  2. When is this test scheduled? TBD        3. Are there any medications that need to be held prior to test and how long? No. Calling for advisement on pacemaker. [S/p sinus node arrest and complete heart block, wears dual-chamber permanent pacemaker ([employer] Revo implanted June 2011)]  4. Name of physician ordering MRI? Dr. Cammy Brochure with [facility]        5. What is your office phone and fax number? [phone] phone (Darrick Grinder, MRI Tech) or Fax  [phone]

## 2016-05-06 NOTE — Telephone Encounter (Signed)
Letter faxed.

## 2016-05-11 DIAGNOSIS — J301 Allergic rhinitis due to pollen: Secondary | ICD-10-CM | POA: Diagnosis not present

## 2016-05-11 DIAGNOSIS — H6121 Impacted cerumen, right ear: Secondary | ICD-10-CM | POA: Diagnosis not present

## 2016-05-14 DIAGNOSIS — G3189 Other specified degenerative diseases of nervous system: Secondary | ICD-10-CM | POA: Diagnosis not present

## 2016-05-14 DIAGNOSIS — M5031 Other cervical disc degeneration,  high cervical region: Secondary | ICD-10-CM | POA: Diagnosis not present

## 2016-05-14 DIAGNOSIS — G319 Degenerative disease of nervous system, unspecified: Secondary | ICD-10-CM | POA: Diagnosis not present

## 2016-05-20 DIAGNOSIS — C44219 Basal cell carcinoma of skin of left ear and external auricular canal: Secondary | ICD-10-CM | POA: Diagnosis not present

## 2016-05-20 DIAGNOSIS — Z85828 Personal history of other malignant neoplasm of skin: Secondary | ICD-10-CM | POA: Diagnosis not present

## 2016-05-27 DIAGNOSIS — R911 Solitary pulmonary nodule: Secondary | ICD-10-CM | POA: Diagnosis not present

## 2016-05-27 DIAGNOSIS — I10 Essential (primary) hypertension: Secondary | ICD-10-CM | POA: Diagnosis not present

## 2016-06-03 ENCOUNTER — Other Ambulatory Visit: Payer: Self-pay | Admitting: Cardiovascular Disease

## 2016-06-25 ENCOUNTER — Encounter: Payer: Medicare Other | Admitting: *Deleted

## 2016-06-26 ENCOUNTER — Telehealth: Payer: Self-pay | Admitting: Cardiology

## 2016-06-26 ENCOUNTER — Encounter: Payer: Self-pay | Admitting: Cardiology

## 2016-06-26 NOTE — Telephone Encounter (Signed)
LMOVM reminding pt to send remote transmission.   

## 2016-07-01 ENCOUNTER — Ambulatory Visit (INDEPENDENT_AMBULATORY_CARE_PROVIDER_SITE_OTHER): Payer: Medicare Other | Admitting: *Deleted

## 2016-07-01 DIAGNOSIS — I442 Atrioventricular block, complete: Secondary | ICD-10-CM

## 2016-07-01 NOTE — Progress Notes (Signed)
Remote pacemaker transmission.   

## 2016-07-02 ENCOUNTER — Encounter: Payer: Self-pay | Admitting: Cardiology

## 2016-07-06 LAB — CUP PACEART REMOTE DEVICE CHECK
Battery Voltage: 2.93 V
Brady Statistic AP VP Percent: 90.16 %
Brady Statistic AP VS Percent: 0.12 %
Brady Statistic AS VP Percent: 9.6 %
Brady Statistic AS VS Percent: 0.11 %
Implantable Lead Implant Date: 20110617
Implantable Lead Implant Date: 20110617
Implantable Lead Location: 753860
Lead Channel Impedance Value: 432 Ohm
Lead Channel Sensing Intrinsic Amplitude: 1.342 mV
Lead Channel Setting Pacing Amplitude: 2 V
Lead Channel Setting Pacing Amplitude: 2.5 V
MDC IDC LEAD LOCATION: 753859
MDC IDC LEAD MODEL: 5086
MDC IDC LEAD MODEL: 5086
MDC IDC MSMT LEADCHNL RV IMPEDANCE VALUE: 576 Ohm
MDC IDC SESS DTM: 20170809133722
MDC IDC SET LEADCHNL RV PACING PULSEWIDTH: 0.6 ms
MDC IDC SET LEADCHNL RV SENSING SENSITIVITY: 1.5 mV
MDC IDC STAT BRADY RA PERCENT PACED: 90.29 %
MDC IDC STAT BRADY RV PERCENT PACED: 99.77 %

## 2016-08-07 DIAGNOSIS — M79644 Pain in right finger(s): Secondary | ICD-10-CM | POA: Diagnosis not present

## 2016-08-07 DIAGNOSIS — S62605A Fracture of unspecified phalanx of left ring finger, initial encounter for closed fracture: Secondary | ICD-10-CM | POA: Diagnosis not present

## 2016-08-07 DIAGNOSIS — M79645 Pain in left finger(s): Secondary | ICD-10-CM | POA: Diagnosis not present

## 2016-08-07 DIAGNOSIS — M65311 Trigger thumb, right thumb: Secondary | ICD-10-CM | POA: Diagnosis not present

## 2016-08-07 DIAGNOSIS — M1811 Unilateral primary osteoarthritis of first carpometacarpal joint, right hand: Secondary | ICD-10-CM | POA: Diagnosis not present

## 2016-08-19 DIAGNOSIS — E7800 Pure hypercholesterolemia, unspecified: Secondary | ICD-10-CM | POA: Diagnosis not present

## 2016-08-19 DIAGNOSIS — I10 Essential (primary) hypertension: Secondary | ICD-10-CM | POA: Diagnosis not present

## 2016-09-01 DIAGNOSIS — N529 Male erectile dysfunction, unspecified: Secondary | ICD-10-CM | POA: Diagnosis not present

## 2016-09-01 DIAGNOSIS — M1990 Unspecified osteoarthritis, unspecified site: Secondary | ICD-10-CM | POA: Diagnosis not present

## 2016-09-01 DIAGNOSIS — Z9889 Other specified postprocedural states: Secondary | ICD-10-CM | POA: Diagnosis not present

## 2016-09-01 DIAGNOSIS — E7800 Pure hypercholesterolemia, unspecified: Secondary | ICD-10-CM | POA: Diagnosis not present

## 2016-09-01 DIAGNOSIS — T783XXA Angioneurotic edema, initial encounter: Secondary | ICD-10-CM | POA: Diagnosis not present

## 2016-09-01 DIAGNOSIS — Z23 Encounter for immunization: Secondary | ICD-10-CM | POA: Diagnosis not present

## 2016-09-07 ENCOUNTER — Other Ambulatory Visit: Payer: Self-pay | Admitting: Internal Medicine

## 2016-09-07 DIAGNOSIS — R0989 Other specified symptoms and signs involving the circulatory and respiratory systems: Secondary | ICD-10-CM

## 2016-09-14 DIAGNOSIS — M25561 Pain in right knee: Secondary | ICD-10-CM | POA: Diagnosis not present

## 2016-09-14 DIAGNOSIS — M1712 Unilateral primary osteoarthritis, left knee: Secondary | ICD-10-CM | POA: Diagnosis not present

## 2016-09-14 DIAGNOSIS — M170 Bilateral primary osteoarthritis of knee: Secondary | ICD-10-CM | POA: Diagnosis not present

## 2016-09-14 DIAGNOSIS — M25562 Pain in left knee: Secondary | ICD-10-CM | POA: Diagnosis not present

## 2016-09-21 ENCOUNTER — Ambulatory Visit
Admission: RE | Admit: 2016-09-21 | Discharge: 2016-09-21 | Disposition: A | Discharge status: Home / Self Care | Payer: Medicare Other | Source: Ambulatory Visit | Attending: Internal Medicine | Admitting: Internal Medicine

## 2016-09-21 DIAGNOSIS — R0989 Other specified symptoms and signs involving the circulatory and respiratory systems: Secondary | ICD-10-CM | POA: Diagnosis not present

## 2016-09-30 ENCOUNTER — Ambulatory Visit (INDEPENDENT_AMBULATORY_CARE_PROVIDER_SITE_OTHER): Payer: Medicare Other | Admitting: *Deleted

## 2016-09-30 ENCOUNTER — Telehealth: Payer: Self-pay | Admitting: Cardiology

## 2016-09-30 DIAGNOSIS — I442 Atrioventricular block, complete: Secondary | ICD-10-CM | POA: Diagnosis not present

## 2016-09-30 NOTE — Telephone Encounter (Signed)
LMOVM reminding pt to send remote transmission.   

## 2016-10-01 DIAGNOSIS — Z85828 Personal history of other malignant neoplasm of skin: Secondary | ICD-10-CM | POA: Diagnosis not present

## 2016-10-01 DIAGNOSIS — L570 Actinic keratosis: Secondary | ICD-10-CM | POA: Diagnosis not present

## 2016-10-01 DIAGNOSIS — L218 Other seborrheic dermatitis: Secondary | ICD-10-CM | POA: Diagnosis not present

## 2016-10-02 ENCOUNTER — Encounter: Payer: Self-pay | Admitting: Cardiology

## 2016-10-02 NOTE — Progress Notes (Signed)
Remote pacemaker transmission.   

## 2016-10-08 DIAGNOSIS — M1712 Unilateral primary osteoarthritis, left knee: Secondary | ICD-10-CM | POA: Diagnosis not present

## 2016-10-08 DIAGNOSIS — M25562 Pain in left knee: Secondary | ICD-10-CM | POA: Diagnosis not present

## 2016-10-08 DIAGNOSIS — M25561 Pain in right knee: Secondary | ICD-10-CM | POA: Diagnosis not present

## 2016-10-08 DIAGNOSIS — M170 Bilateral primary osteoarthritis of knee: Secondary | ICD-10-CM | POA: Diagnosis not present

## 2016-10-26 DIAGNOSIS — M170 Bilateral primary osteoarthritis of knee: Secondary | ICD-10-CM | POA: Diagnosis not present

## 2016-10-26 DIAGNOSIS — M25562 Pain in left knee: Secondary | ICD-10-CM | POA: Diagnosis not present

## 2016-10-26 DIAGNOSIS — M25561 Pain in right knee: Secondary | ICD-10-CM | POA: Diagnosis not present

## 2016-10-26 DIAGNOSIS — M1711 Unilateral primary osteoarthritis, right knee: Secondary | ICD-10-CM | POA: Diagnosis not present

## 2016-10-26 DIAGNOSIS — M1712 Unilateral primary osteoarthritis, left knee: Secondary | ICD-10-CM | POA: Diagnosis not present

## 2016-10-27 LAB — CUP PACEART REMOTE DEVICE CHECK
Brady Statistic AP VP Percent: 78.58 %
Brady Statistic AP VS Percent: 0.09 %
Brady Statistic AS VP Percent: 21.16 %
Brady Statistic AS VS Percent: 0.16 %
Brady Statistic RV Percent Paced: 99.75 %
Implantable Lead Implant Date: 20110617
Implantable Lead Location: 753860
Implantable Lead Model: 5086
Implantable Lead Model: 5086
Lead Channel Impedance Value: 456 Ohm
Lead Channel Sensing Intrinsic Amplitude: 1.082 mV
Lead Channel Setting Pacing Amplitude: 2 V
Lead Channel Setting Pacing Amplitude: 2.5 V
Lead Channel Setting Sensing Sensitivity: 1.5 mV
MDC IDC LEAD IMPLANT DT: 20110617
MDC IDC LEAD LOCATION: 753859
MDC IDC MSMT BATTERY VOLTAGE: 2.92 V
MDC IDC MSMT LEADCHNL RV IMPEDANCE VALUE: 632 Ohm
MDC IDC PG IMPLANT DT: 20110617
MDC IDC SESS DTM: 20171109220716
MDC IDC SET LEADCHNL RV PACING PULSEWIDTH: 0.6 ms
MDC IDC STAT BRADY RA PERCENT PACED: 78.67 %

## 2016-11-07 ENCOUNTER — Other Ambulatory Visit: Payer: Self-pay | Admitting: Cardiovascular Disease

## 2017-01-04 ENCOUNTER — Telehealth: Payer: Self-pay | Admitting: Cardiology

## 2017-01-04 ENCOUNTER — Ambulatory Visit (INDEPENDENT_AMBULATORY_CARE_PROVIDER_SITE_OTHER): Payer: Medicare Other | Admitting: *Deleted

## 2017-01-04 DIAGNOSIS — I442 Atrioventricular block, complete: Secondary | ICD-10-CM

## 2017-01-04 NOTE — Telephone Encounter (Signed)
LMOVM reminding pt to send remote transmission.   

## 2017-01-05 ENCOUNTER — Encounter: Payer: Self-pay | Admitting: Cardiology

## 2017-01-05 LAB — CUP PACEART REMOTE DEVICE CHECK
Battery Voltage: 2.9 V
Brady Statistic AS VP Percent: 15.36 %
Brady Statistic RA Percent Paced: 83.54 %
Date Time Interrogation Session: 20180212184255
Implantable Lead Location: 753859
Implantable Pulse Generator Implant Date: 20110617
Lead Channel Impedance Value: 592 Ohm
Lead Channel Sensing Intrinsic Amplitude: 1.602 mV
Lead Channel Setting Pacing Pulse Width: 0.6 ms
MDC IDC LEAD IMPLANT DT: 20110617
MDC IDC LEAD IMPLANT DT: 20110617
MDC IDC LEAD LOCATION: 753860
MDC IDC MSMT LEADCHNL RA IMPEDANCE VALUE: 424 Ohm
MDC IDC SET LEADCHNL RA PACING AMPLITUDE: 2 V
MDC IDC SET LEADCHNL RV PACING AMPLITUDE: 2.5 V
MDC IDC SET LEADCHNL RV SENSING SENSITIVITY: 1.5 mV
MDC IDC STAT BRADY AP VP PERCENT: 84.21 %
MDC IDC STAT BRADY AP VS PERCENT: 0.21 %
MDC IDC STAT BRADY AS VS PERCENT: 0.22 %
MDC IDC STAT BRADY RV PERCENT PACED: 98.54 %

## 2017-01-05 NOTE — Progress Notes (Signed)
Remote pacemaker transmission.   

## 2017-01-26 ENCOUNTER — Other Ambulatory Visit: Payer: Self-pay | Admitting: Cardiovascular Disease

## 2017-04-18 NOTE — Progress Notes (Signed)
Patient ID: [name], male   DOB: Dec 05, 1923, 83 y.o.   MRN: [id number]    Cardiology Office Note    Date:  04/21/2017   ID:  William [name], DOB 19-Aug-1924, MRN [id number]  PCP:  Deland Pretty, MD  Cardiologist:   Sanda Klein, MD   Chief Complaint  Patient presents with  . Follow-up    History of Present Illness:  William [name] is a 83 y.o. male with sinus node arrest and complete heart block here for follow-up on his dual-chamber permanent pacemaker ([employer] Revo implanted June 2011), as well as for follow-up of hypertension, hyperlipidemia and abdominal aortic aneurysm status post surgical repair.  He feels well and continues to live independently taking care of all his housework and yardwork, except he has stopped mowing his own lawn. He shoveled the snow from his own driveway this winter. He does complain of feeling tired, which has not happened his entire life.William [name] He denies any angina or dyspnea with activity although he has occasional back discomfort. He denies syncope and is not aware of palpitations. He denies claudication, bleeding, focal neurological events, leg edema, visual disturbances, abdominal pain or change in bowel pattern, intolerance to heat or cold.  Pacemaker interrogation shows normal device function. His presenting rhythm is AV sequential paced. He has 85% atrial pacing and 99.5 % ventricular pacing. Heart rate histogram distribution appears favorable, activity level is only slightly less than last year, at around 3.6 hours a day over the last year. Lead parameters are unchanged and within normal range. Underlying rhythm today is atrial bradycardia at about 35-40 bpm with complete heart block and there is no ventricular escape rhythm  He has frequent PVCs seen on today's ECG and his pacemaker has documented a fairly lengthy episode of nonsustained VT at 27 beats, albeit relatively slow at 162 bpm. Also recorded is a 2 minute episode of supraventricular  arrhythmia, possibly paroxysmal atrial fibrillation or atrial flutter at around 300 bpm. This occurred in August 2017 and has not been seen since.  Abdominal aorta ultrasound performed exactly a year ago showed no evidence of aneurysm (surgical repair with aortobiiliac bypass graft).  Past Medical History:  Diagnosis Date  . AAA (abdominal aortic aneurysm) ([place])   . Atrioventricular block   . Cardiac pacemaker 05/09/10   [employer]tronic dual chamber  . Dyslipidemia   . Nonsustained ventricular tachycardia ([place])     Past Surgical History:  Procedure Laterality Date  . ABDOMINAL AORTIC ANEURYSM REPAIR  08/02/08   Dr. Donnetta Hutching  . MENISCUS REPAIR     both knees  . RETINAL TEAR REPAIR CRYOTHERAPY      Current Medications: Outpatient Medications Prior to Visit  Medication Sig Dispense Refill  . Multiple Vitamin (MULTIVITAMIN) tablet Take 1 tablet by mouth daily.    William [name] lisinopril-hydrochlorothiazide (PRINZIDE,ZESTORETIC) 20-12.5 MG tablet Take 1 tablet by mouth daily. 90 tablet 0  . lisinopril-hydrochlorothiazide (PRINZIDE,ZESTORETIC) 20-12.5 MG tablet TAKE 1 TABLET BY MOUTH DAILY. (Patient not taking: Reported on 04/21/2017) 90 tablet 3   No facility-administered medications prior to visit.      Allergies:   Crestor [rosuvastatin]; Lipitor [atorvastatin]; and Simvastatin   Social History   Social History  . Marital status: Married    Spouse name: N/A  . Number of children: N/A  . Years of education: N/A   Social History Main Topics  . Smoking status: Former Research scientist (life sciences)  . Smokeless tobacco: Former Systems developer    Quit date: 11/22/1974  . Alcohol use  4.2 oz/week    7 Standard drinks or equivalent per week  . Drug use: No  . Sexual activity: Not Asked   Other Topics Concern  . None   Social History Narrative  . None       ROS:   Please see the history of present illness.    ROS All other systems reviewed and are negative.   PHYSICAL EXAM:   VS:  BP 130/70   Pulse 85   Ht 5\' 9"   (1.753 m)   Wt 141 lb (64 kg)   BMI 20.82 kg/m     General: Alert, oriented x3, no distress Head: no evidence of trauma, PERRL, EOMI, no exophtalmos or lid lag, no myxedema, no xanthelasma; normal ears, nose and oropharynx Neck: normal jugular venous pulsations and no hepatojugular reflux; brisk carotid pulses without delay and no carotid bruits Chest: clear to auscultation, no signs of consolidation by percussion or palpation, normal fremitus, symmetrical and full respiratory excursions,  healthy left subclavian pacemaker site Cardiovascular: normal position and quality of the apical impulse, regular rhythm, normal first and second heart sounds, no murmurs, rubs or gallops Abdomen: no tenderness or distention, no masses by palpation, no abnormal pulsatility or arterial bruits, normal bowel sounds, no hepatosplenomegaly Extremities: no clubbing, cyanosis or edema; 2+ radial, ulnar and brachial pulses bilaterally; 2+ right femoral, posterior tibial and dorsalis pedis pulses; 2+ left femoral, posterior tibial and dorsalis pedis pulses; no subclavian or femoral bruits Neurological: grossly nonfocal Psych: euthymic mood, full affect  Wt Readings from Last 3 Encounters:  04/21/17 141 lb (64 kg)  03/24/16 142 lb 9.6 oz (64.7 kg)  03/12/15 141 lb 3.2 oz (64 kg)      Studies/Labs Reviewed:   EKG:  EKG is  ordered today.  The ekg ordered today demonstrates Atrioventricular sequential pacing, but also frequent PVCs. The first half of the tracing shows ventricular bigeminy with monomorphic PVCs use, but at least one other PVC morphology is noted on the same tracing.  Recent Labs: No results found for requested labs within last 8760 hours.   Lipid Panel    Component Value Date/Time   CHOL 182 05/29/2013 0810   TRIG 103 05/29/2013 0810   HDL 39 (L) 05/29/2013 0810   CHOLHDL 4.7 05/29/2013 0810   VLDL 21 05/29/2013 0810   LDLCALC 122 (H) 05/29/2013 0810     ASSESSMENT:    1. Complete  heart block ([place])   2. Atrial standstill   3. Pacemaker   4. Ventricular tachycardia, nonsustained ([place])   5. Essential hypertension   6. Abdominal aortic aneurysm (AAA) without rupture ([facility])   7. Other fatigue      PLAN:  In order of problems listed above:  1. CHB: Pacemaker dependent 2. SSS: At many times when we check his device there is no underlying atrial rhythm  (today there is underlying atrial activity at about 35-40 bpm), but he does have native atrial activation roughly 15% of the time. Heart rate histogram distribution suggests that his current sensor settings are appropriate 3. PPM: Normal device function, remote download every 3 months and yearly office visit. He has an MRI conditional device 4. NSVT: similar to previous device downloads he has had occasional runs of ventricular tachycardia. These are consistently asymptomatic. The last episode of nonsustained VT is particularly long at 27 bpm. I think it's reasonable to switch from lisinopril to a beta blocker especially since he is also having a lot of PVCs today.William [name]  5.  HTN: Blood pressure is well controlled, but I think we'll switch from ACE inhibitor/diuretic to a beta blocker/diuretic combination. We'll start with atenolol 25 mg/chlorthalidone 12.5 mg and have him call us with blood pressure readings in about 2 weeks. 6. AAA s/p Aobifem: No evidence of recurrent aneurysm, no symptoms of claudication, normal distal pulses. Plan repeat ultrasound next May, 2019. 7. Check labs for anemia or hypothyroidism due to his complaints of fatigue. I still think he is remarkably active for his age.   Medication Adjustments/Labs and Tests Ordered: Current medicines are reviewed at length with the patient today.  Concerns regarding medicines are outlined above.  Medication changes, Labs and Tests ordered today are listed in the Patient Instructions below. Patient Instructions  Dr Sallyanne Kuster has recommended making the following medication  changes: 1. STOP Lisinopril-HCT 2. START Atenolol-Chlorthalidone 50 mg/25 mg - take 0.5 tablet by mouth daily  Your physician recommends that you return for lab work TODAY.  Remote monitoring is used to monitor your Pacemaker or ICD from home. This monitoring reduces the number of office visits required to check your device to one time per year. It allows [place] to keep an eye on the functioning of your device to ensure it is working properly. You are scheduled for a device check from home on Wednesday, August 29th, 2018. You may send your transmission at any time that day. If you have a wireless device, the transmission will be sent automatically. After your physician reviews your transmission, you will receive a notification with your next transmission date.  Dr Sallyanne Kuster recommends that you schedule a follow-up appointment in 12 months with a pacemaker check. You will receive a reminder letter in the mail two months in advance. If you don't receive a letter, please call our office to schedule the follow-up appointment.  If you need a refill on your cardiac medications before your next appointment, please call your pharmacy.    Your physician has requested that you regularly monitor and record your blood pressure readings at home. Please use the same machine to check your readings and record them. Please report your blood pressure readings back to [place] in 2 WEEKS. You can send these either via mychart, mail, fax or by bringing them in person.    Signed, Sanda Klein, MD  04/21/2017 10:47 AM    [facility] [place], [place], [place]  [address] Phone: [phone]; Fax: [phone]

## 2017-04-21 ENCOUNTER — Ambulatory Visit (INDEPENDENT_AMBULATORY_CARE_PROVIDER_SITE_OTHER): Payer: Medicare Other | Admitting: Cardiovascular Disease

## 2017-04-21 ENCOUNTER — Encounter: Payer: Self-pay | Admitting: Cardiovascular Disease

## 2017-04-21 VITALS — BP 130/70 | HR 85 | Ht 69.0 in | Wt 141.0 lb

## 2017-04-21 DIAGNOSIS — Z950 Presence of cardiac pacemaker: Secondary | ICD-10-CM

## 2017-04-21 DIAGNOSIS — I455 Other specified heart block: Secondary | ICD-10-CM

## 2017-04-21 DIAGNOSIS — R5383 Other fatigue: Secondary | ICD-10-CM

## 2017-04-21 DIAGNOSIS — I714 Abdominal aortic aneurysm, without rupture, unspecified: Secondary | ICD-10-CM

## 2017-04-21 DIAGNOSIS — I442 Atrioventricular block, complete: Secondary | ICD-10-CM | POA: Diagnosis not present

## 2017-04-21 DIAGNOSIS — I472 Ventricular tachycardia: Secondary | ICD-10-CM

## 2017-04-21 DIAGNOSIS — I10 Essential (primary) hypertension: Secondary | ICD-10-CM | POA: Diagnosis not present

## 2017-04-21 DIAGNOSIS — I4729 Other ventricular tachycardia: Secondary | ICD-10-CM

## 2017-04-21 LAB — CUP PACEART INCLINIC DEVICE CHECK
Battery Voltage: 2.89 V
Brady Statistic AS VP Percent: 15.35 %
Date Time Interrogation Session: 20180530133815
Implantable Lead Implant Date: 20110617
Implantable Lead Location: 753859
Implantable Pulse Generator Implant Date: 20110617
Lead Channel Pacing Threshold Amplitude: 1 V
Lead Channel Pacing Threshold Pulse Width: 0.6 ms
Lead Channel Setting Pacing Amplitude: 2 V
Lead Channel Setting Pacing Pulse Width: 0.6 ms
Lead Channel Setting Sensing Sensitivity: 1.5 mV
MDC IDC LEAD IMPLANT DT: 20110617
MDC IDC LEAD LOCATION: 753860
MDC IDC MSMT LEADCHNL RA IMPEDANCE VALUE: 432 Ohm
MDC IDC MSMT LEADCHNL RA PACING THRESHOLD AMPLITUDE: 1 V
MDC IDC MSMT LEADCHNL RA PACING THRESHOLD PULSEWIDTH: 0.4 ms
MDC IDC MSMT LEADCHNL RA SENSING INTR AMPL: 1.342 mV
MDC IDC MSMT LEADCHNL RV IMPEDANCE VALUE: 568 Ohm
MDC IDC SET LEADCHNL RV PACING AMPLITUDE: 2.5 V
MDC IDC STAT BRADY AP VP PERCENT: 84.19 %
MDC IDC STAT BRADY AP VS PERCENT: 0.2 %
MDC IDC STAT BRADY AS VS PERCENT: 0.26 %
MDC IDC STAT BRADY RA PERCENT PACED: 83.32 %
MDC IDC STAT BRADY RV PERCENT PACED: 97.99 %

## 2017-04-21 LAB — CBC
Hematocrit: 41.6 % (ref 37.5–51.0)
Hemoglobin: 14.4 g/dL (ref 13.0–17.7)
MCH: 31.5 pg (ref 26.6–33.0)
MCHC: 34.6 g/dL (ref 31.5–35.7)
MCV: 91 fL (ref 79–97)
PLATELETS: 194 10*3/uL (ref 150–379)
RBC: 4.57 x10E6/uL (ref 4.14–5.80)
RDW: 13.6 % (ref 12.3–15.4)
WBC: 5.8 10*3/uL (ref 3.4–10.8)

## 2017-04-21 LAB — BASIC METABOLIC PANEL
BUN / CREAT RATIO: 30 — AB (ref 10–24)
BUN: 24 mg/dL (ref 10–36)
CALCIUM: 9.5 mg/dL (ref 8.6–10.2)
CHLORIDE: 98 mmol/L (ref 96–106)
CO2: 23 mmol/L (ref 18–29)
Creatinine, Ser: 0.81 mg/dL (ref 0.76–1.27)
GFR calc non Af Amer: 78 mL/min/{1.73_m2} (ref >59)
GFR, EST AFRICAN AMERICAN: 90 mL/min/{1.73_m2} (ref >59)
Glucose: 97 mg/dL (ref 65–99)
POTASSIUM: 4.7 mmol/L (ref 3.5–5.2)
SODIUM: 136 mmol/L (ref 134–144)

## 2017-04-21 LAB — TSH: TSH: 5.71 u[IU]/mL — ABNORMAL HIGH (ref 0.450–4.500)

## 2017-04-21 MED ORDER — ATENOLOL-CHLORTHALIDONE 50-25 MG PO TABS: 0.5000 | ORAL_TABLET | Freq: Every day | ORAL | 3 refills | Status: DC

## 2017-04-21 NOTE — Patient Instructions (Signed)
Dr Sallyanne Kuster has recommended making the following medication changes: 1. STOP Lisinopril-HCT 2. START Atenolol-Chlorthalidone 50 mg/25 mg - take 0.5 tablet by mouth daily  Your physician recommends that you return for lab work TODAY.  Remote monitoring is used to monitor your Pacemaker or ICD from home. This monitoring reduces the number of office visits required to check your device to one time per year. It allows [place] to keep an eye on the functioning of your device to ensure it is working properly. You are scheduled for a device check from home on Wednesday, August 29th, 2018. You may send your transmission at any time that day. If you have a wireless device, the transmission will be sent automatically. After your physician reviews your transmission, you will receive a notification with your next transmission date.  Dr Sallyanne Kuster recommends that you schedule a follow-up appointment in 12 months with a pacemaker check. You will receive a reminder letter in the mail two months in advance. If you don't receive a letter, please call our office to schedule the follow-up appointment.  If you need a refill on your cardiac medications before your next appointment, please call your pharmacy.    Your physician has requested that you regularly monitor and record your blood pressure readings at home. Please use the same machine to check your readings and record them. Please report your blood pressure readings back to [place] in 2 WEEKS. You can send these either via mychart, mail, fax or by bringing them in person.

## 2017-04-24 ENCOUNTER — Other Ambulatory Visit: Payer: Self-pay | Admitting: Cardiovascular Disease

## 2017-04-26 ENCOUNTER — Other Ambulatory Visit (HOSPITAL_COMMUNITY): Payer: Self-pay | Admitting: Internal Medicine

## 2017-04-26 DIAGNOSIS — I714 Abdominal aortic aneurysm, without rupture, unspecified: Secondary | ICD-10-CM

## 2017-04-28 DIAGNOSIS — H524 Presbyopia: Secondary | ICD-10-CM | POA: Diagnosis not present

## 2017-04-28 DIAGNOSIS — H353132 Nonexudative age-related macular degeneration, bilateral, intermediate dry stage: Secondary | ICD-10-CM | POA: Diagnosis not present

## 2017-04-28 DIAGNOSIS — H01001 Unspecified blepharitis right upper eyelid: Secondary | ICD-10-CM | POA: Diagnosis not present

## 2017-04-28 DIAGNOSIS — H26491 Other secondary cataract, right eye: Secondary | ICD-10-CM | POA: Diagnosis not present

## 2017-05-07 DIAGNOSIS — M19041 Primary osteoarthritis, right hand: Secondary | ICD-10-CM | POA: Diagnosis not present

## 2017-05-07 DIAGNOSIS — M65351 Trigger finger, right little finger: Secondary | ICD-10-CM | POA: Diagnosis not present

## 2017-05-10 ENCOUNTER — Telehealth: Payer: Self-pay | Admitting: Cardiovascular Disease

## 2017-05-10 NOTE — Telephone Encounter (Signed)
Spoke with pt he states that Dr C changed him to atenolol-chlorthalidone 50-25mg  and he states that he wants to change back to lisinopril-hydrochlorothiazide 20/12.5mg  because he states that he is urinating too much(all day and into the night), he is hardly sleeping at all night because if this medication and he would like to change back to lisinopril. He states that his BP is good 120'-130/70-80's he states that even on lisinopril this is how his BP funs. He states that Dr C wanted him to try a beta blocker. Pt would like refill.   Per OV note:  HTN: Blood pressure is well controlled, but I think we'll switch from ACE inhibitor/diuretic to a beta blocker/diuretic combination. We'll start with atenolol 25 mg/chlorthalidone 12.5 mg and have him call us with blood pressure readings in about 2 weeks.

## 2017-05-10 NOTE — Telephone Encounter (Signed)
Patient calling, would like to speak with nurse, did not go into details. Patient states that he will be available after 10 am today but not before. Thanks.

## 2017-05-10 NOTE — Telephone Encounter (Signed)
Pt states that he is taking atenolol-chlorthalidone at 8am

## 2017-05-10 NOTE — Telephone Encounter (Signed)
DR C changed his lisinopril to atenolol to control patient's arrhythmias. Lisinopril controls the BP but not the irregular heart beats.  What time of the day he is taking his medication. Patient should be taking atenolol/Chlorthalidone 1/2 table early morning to avoid trouble sleeping.  If still causing problems; we need to order atenolol and HCTZ as 2 separate prescriptions

## 2017-05-11 ENCOUNTER — Ambulatory Visit (HOSPITAL_COMMUNITY)
Admission: RE | Admit: 2017-05-11 | Discharge: 2017-05-11 | Disposition: A | Discharge status: Home / Self Care | Payer: Medicare Other | Source: Ambulatory Visit | Attending: Cardiovascular Disease | Admitting: Cardiovascular Disease

## 2017-05-11 DIAGNOSIS — I714 Abdominal aortic aneurysm, without rupture, unspecified: Secondary | ICD-10-CM

## 2017-05-11 DIAGNOSIS — Z951 Presence of aortocoronary bypass graft: Secondary | ICD-10-CM | POA: Insufficient documentation

## 2017-05-11 DIAGNOSIS — I771 Stricture of artery: Secondary | ICD-10-CM | POA: Diagnosis not present

## 2017-05-11 MED ORDER — HYDROCHLOROTHIAZIDE 12.5 MG PO CAPS: 12.5000 mg | ORAL_CAPSULE | Freq: Every day | ORAL | 5 refills | Status: DC

## 2017-05-11 MED ORDER — ATENOLOL 25 MG PO TABS: 25.0000 mg | ORAL_TABLET | Freq: Every day | ORAL | 5 refills | Status: DC

## 2017-05-11 NOTE — Telephone Encounter (Signed)
S/w pt he states that he is going to wait for Dr Sallyanne Kuster to come back for his input because he states that he does not want to take the Atenolol he states that at this time he will continue with lisinopril until Dr Croitoru's return.

## 2017-05-11 NOTE — Telephone Encounter (Signed)
Patient should continue atenolol for irregular heart beats as prescribed by Dr Sallyanne Kuster. (Please verify he is taking 1/2 tablet of atenolol/chlorthalidone 50/25)   Okay to dc atenolol/chlorthalidone tablet and order atenolol 25mg  daily and HCTZ 12.5mg  daily excessive diuresis noted

## 2017-05-24 NOTE — Telephone Encounter (Signed)
Spoke with pt he states that he did not notice any difference when taking the atenolol-chlorthalidone

## 2017-05-24 NOTE — Telephone Encounter (Signed)
Did the palpitations get any better after the atenolol? If the atenolol did not help the palpitations anyway, go back to his old lisinopril HCT combo. William [name]

## 2017-05-25 ENCOUNTER — Encounter: Payer: Self-pay | Admitting: Cardiovascular Disease

## 2017-06-01 ENCOUNTER — Other Ambulatory Visit: Payer: Self-pay | Admitting: Cardiovascular Disease

## 2017-06-02 NOTE — Telephone Encounter (Signed)
REFILL 

## 2017-07-21 ENCOUNTER — Encounter: Payer: Medicare Other | Admitting: *Deleted

## 2017-07-23 ENCOUNTER — Encounter: Payer: Self-pay | Admitting: Cardiology

## 2017-08-03 ENCOUNTER — Ambulatory Visit (INDEPENDENT_AMBULATORY_CARE_PROVIDER_SITE_OTHER): Payer: Medicare Other | Admitting: *Deleted

## 2017-08-03 DIAGNOSIS — I442 Atrioventricular block, complete: Secondary | ICD-10-CM | POA: Diagnosis not present

## 2017-08-03 NOTE — Progress Notes (Signed)
Remote pacemaker transmission.   

## 2017-08-04 ENCOUNTER — Encounter: Payer: Self-pay | Admitting: Cardiology

## 2017-08-04 LAB — CUP PACEART REMOTE DEVICE CHECK
Brady Statistic AS VP Percent: 10.58 %
Brady Statistic RA Percent Paced: 87.42 %
Date Time Interrogation Session: 20180910183216
Implantable Lead Implant Date: 20110617
Implantable Lead Location: 753859
Implantable Pulse Generator Implant Date: 20110617
Lead Channel Impedance Value: 560 Ohm
Lead Channel Setting Pacing Amplitude: 2 V
Lead Channel Setting Pacing Pulse Width: 0.6 ms
Lead Channel Setting Sensing Sensitivity: 1.5 mV
MDC IDC LEAD IMPLANT DT: 20110617
MDC IDC LEAD LOCATION: 753860
MDC IDC MSMT BATTERY VOLTAGE: 2.88 V
MDC IDC MSMT LEADCHNL RA IMPEDANCE VALUE: 440 Ohm
MDC IDC MSMT LEADCHNL RA SENSING INTR AMPL: 1.342 mV
MDC IDC SET LEADCHNL RV PACING AMPLITUDE: 2.5 V
MDC IDC STAT BRADY AP VP PERCENT: 88.91 %
MDC IDC STAT BRADY AP VS PERCENT: 0.22 %
MDC IDC STAT BRADY AS VS PERCENT: 0.29 %
MDC IDC STAT BRADY RV PERCENT PACED: 96.66 %

## 2017-09-09 DIAGNOSIS — I10 Essential (primary) hypertension: Secondary | ICD-10-CM | POA: Diagnosis not present

## 2017-09-09 DIAGNOSIS — E7800 Pure hypercholesterolemia, unspecified: Secondary | ICD-10-CM | POA: Diagnosis not present

## 2017-09-09 DIAGNOSIS — E039 Hypothyroidism, unspecified: Secondary | ICD-10-CM | POA: Diagnosis not present

## 2017-09-14 DIAGNOSIS — I10 Essential (primary) hypertension: Secondary | ICD-10-CM | POA: Diagnosis not present

## 2017-09-14 DIAGNOSIS — E7800 Pure hypercholesterolemia, unspecified: Secondary | ICD-10-CM | POA: Diagnosis not present

## 2017-09-14 DIAGNOSIS — M1990 Unspecified osteoarthritis, unspecified site: Secondary | ICD-10-CM | POA: Diagnosis not present

## 2017-09-14 DIAGNOSIS — L80 Vitiligo: Secondary | ICD-10-CM | POA: Diagnosis not present

## 2017-09-14 DIAGNOSIS — D229 Melanocytic nevi, unspecified: Secondary | ICD-10-CM | POA: Diagnosis not present

## 2017-09-14 DIAGNOSIS — R0989 Other specified symptoms and signs involving the circulatory and respiratory systems: Secondary | ICD-10-CM | POA: Diagnosis not present

## 2017-09-14 DIAGNOSIS — N529 Male erectile dysfunction, unspecified: Secondary | ICD-10-CM | POA: Diagnosis not present

## 2017-09-14 DIAGNOSIS — Z789 Other specified health status: Secondary | ICD-10-CM | POA: Diagnosis not present

## 2017-09-14 DIAGNOSIS — E039 Hypothyroidism, unspecified: Secondary | ICD-10-CM | POA: Diagnosis not present

## 2017-09-14 DIAGNOSIS — Z9889 Other specified postprocedural states: Secondary | ICD-10-CM | POA: Diagnosis not present

## 2017-09-14 DIAGNOSIS — N401 Enlarged prostate with lower urinary tract symptoms: Secondary | ICD-10-CM | POA: Diagnosis not present

## 2017-10-05 DIAGNOSIS — I781 Nevus, non-neoplastic: Secondary | ICD-10-CM | POA: Diagnosis not present

## 2017-10-05 DIAGNOSIS — L821 Other seborrheic keratosis: Secondary | ICD-10-CM | POA: Diagnosis not present

## 2017-11-02 ENCOUNTER — Ambulatory Visit (INDEPENDENT_AMBULATORY_CARE_PROVIDER_SITE_OTHER): Payer: Medicare Other | Admitting: *Deleted

## 2017-11-02 DIAGNOSIS — I442 Atrioventricular block, complete: Secondary | ICD-10-CM | POA: Diagnosis not present

## 2017-11-03 NOTE — Progress Notes (Signed)
Remote pacemaker transmission.   

## 2017-11-04 LAB — CUP PACEART REMOTE DEVICE CHECK
Brady Statistic RA Percent Paced: 85.83 %
Date Time Interrogation Session: 20181212153705
Implantable Lead Implant Date: 20110617
Implantable Lead Location: 753859
Implantable Pulse Generator Implant Date: 20110617
Lead Channel Impedance Value: 520 Ohm
Lead Channel Setting Sensing Sensitivity: 1.5 mV
MDC IDC LEAD IMPLANT DT: 20110617
MDC IDC LEAD LOCATION: 753860
MDC IDC MSMT BATTERY VOLTAGE: 2.86 V
MDC IDC MSMT LEADCHNL RA IMPEDANCE VALUE: 440 Ohm
MDC IDC MSMT LEADCHNL RA SENSING INTR AMPL: 1.515 mV
MDC IDC SET LEADCHNL RA PACING AMPLITUDE: 2 V
MDC IDC SET LEADCHNL RV PACING AMPLITUDE: 2.5 V
MDC IDC SET LEADCHNL RV PACING PULSEWIDTH: 0.6 ms
MDC IDC STAT BRADY AP VP PERCENT: 86.48 %
MDC IDC STAT BRADY AP VS PERCENT: 0.07 %
MDC IDC STAT BRADY AS VP PERCENT: 13.3 %
MDC IDC STAT BRADY AS VS PERCENT: 0.15 %
MDC IDC STAT BRADY RV PERCENT PACED: 98.12 %

## 2017-11-05 ENCOUNTER — Encounter: Payer: Self-pay | Admitting: Cardiology

## 2018-01-27 DIAGNOSIS — M170 Bilateral primary osteoarthritis of knee: Secondary | ICD-10-CM | POA: Diagnosis not present

## 2018-01-27 DIAGNOSIS — M11261 Other chondrocalcinosis, right knee: Secondary | ICD-10-CM | POA: Diagnosis not present

## 2018-01-27 DIAGNOSIS — R936 Abnormal findings on diagnostic imaging of limbs: Secondary | ICD-10-CM | POA: Diagnosis not present

## 2018-01-27 DIAGNOSIS — Z9889 Other specified postprocedural states: Secondary | ICD-10-CM | POA: Diagnosis not present

## 2018-01-27 DIAGNOSIS — G8929 Other chronic pain: Secondary | ICD-10-CM | POA: Diagnosis not present

## 2018-01-27 DIAGNOSIS — M11262 Other chondrocalcinosis, left knee: Secondary | ICD-10-CM | POA: Diagnosis not present

## 2018-02-01 ENCOUNTER — Telehealth: Payer: Self-pay | Admitting: Cardiology

## 2018-02-01 ENCOUNTER — Ambulatory Visit (INDEPENDENT_AMBULATORY_CARE_PROVIDER_SITE_OTHER): Payer: Medicare Other | Admitting: *Deleted

## 2018-02-01 DIAGNOSIS — I442 Atrioventricular block, complete: Secondary | ICD-10-CM

## 2018-02-01 NOTE — Progress Notes (Signed)
Remote pacemaker transmission.   

## 2018-02-01 NOTE — Telephone Encounter (Signed)
LMOVM reminding pt to send remote transmission.   

## 2018-02-02 ENCOUNTER — Encounter: Payer: Self-pay | Admitting: Cardiology

## 2018-02-09 LAB — CUP PACEART REMOTE DEVICE CHECK
Battery Voltage: 2.85 V
Brady Statistic AS VS Percent: 0.04 %
Brady Statistic RV Percent Paced: 99.76 %
Date Time Interrogation Session: 20190312174028
Implantable Lead Implant Date: 20110617
Lead Channel Impedance Value: 456 Ohm
Lead Channel Setting Sensing Sensitivity: 1.5 mV
MDC IDC LEAD IMPLANT DT: 20110617
MDC IDC LEAD LOCATION: 753859
MDC IDC LEAD LOCATION: 753860
MDC IDC MSMT LEADCHNL RA SENSING INTR AMPL: 1.256 mV
MDC IDC MSMT LEADCHNL RV IMPEDANCE VALUE: 560 Ohm
MDC IDC PG IMPLANT DT: 20110617
MDC IDC SET LEADCHNL RA PACING AMPLITUDE: 2 V
MDC IDC SET LEADCHNL RV PACING AMPLITUDE: 2.5 V
MDC IDC SET LEADCHNL RV PACING PULSEWIDTH: 0.6 ms
MDC IDC STAT BRADY AP VP PERCENT: 86.89 %
MDC IDC STAT BRADY AP VS PERCENT: 0.05 %
MDC IDC STAT BRADY AS VP PERCENT: 13.02 %
MDC IDC STAT BRADY RA PERCENT PACED: 86.74 %

## 2018-04-25 ENCOUNTER — Encounter: Payer: Self-pay | Admitting: *Deleted

## 2018-05-02 DIAGNOSIS — H353132 Nonexudative age-related macular degeneration, bilateral, intermediate dry stage: Secondary | ICD-10-CM | POA: Diagnosis not present

## 2018-05-02 DIAGNOSIS — H52203 Unspecified astigmatism, bilateral: Secondary | ICD-10-CM | POA: Diagnosis not present

## 2018-05-02 DIAGNOSIS — H43813 Vitreous degeneration, bilateral: Secondary | ICD-10-CM | POA: Diagnosis not present

## 2018-05-02 DIAGNOSIS — H26491 Other secondary cataract, right eye: Secondary | ICD-10-CM | POA: Diagnosis not present

## 2018-05-03 ENCOUNTER — Telehealth: Payer: Self-pay

## 2018-05-03 ENCOUNTER — Ambulatory Visit (INDEPENDENT_AMBULATORY_CARE_PROVIDER_SITE_OTHER): Payer: Medicare Other | Admitting: *Deleted

## 2018-05-03 DIAGNOSIS — I442 Atrioventricular block, complete: Secondary | ICD-10-CM

## 2018-05-03 NOTE — Telephone Encounter (Signed)
LMOVM reminding pt to send remote transmission.   

## 2018-05-04 ENCOUNTER — Encounter: Payer: Self-pay | Admitting: Cardiology

## 2018-05-04 NOTE — Progress Notes (Signed)
Letter  

## 2018-05-04 NOTE — Progress Notes (Signed)
Remote pacemaker transmission.   

## 2018-05-06 LAB — CUP PACEART REMOTE DEVICE CHECK
Battery Voltage: 2.82 V
Brady Statistic AP VP Percent: 85.78 %
Brady Statistic AS VS Percent: 0.11 %
Brady Statistic RA Percent Paced: 85.52 %
Brady Statistic RV Percent Paced: 99.41 %
Date Time Interrogation Session: 20190612130512
Implantable Lead Implant Date: 20110617
Implantable Lead Implant Date: 20110617
Implantable Lead Location: 753859
Implantable Pulse Generator Implant Date: 20110617
Lead Channel Impedance Value: 432 Ohm
Lead Channel Setting Pacing Amplitude: 2 V
Lead Channel Setting Sensing Sensitivity: 1.5 mV
MDC IDC LEAD LOCATION: 753860
MDC IDC MSMT LEADCHNL RA SENSING INTR AMPL: 1.039 mV
MDC IDC MSMT LEADCHNL RV IMPEDANCE VALUE: 544 Ohm
MDC IDC SET LEADCHNL RV PACING AMPLITUDE: 2.5 V
MDC IDC SET LEADCHNL RV PACING PULSEWIDTH: 0.6 ms
MDC IDC STAT BRADY AP VS PERCENT: 0.05 %
MDC IDC STAT BRADY AS VP PERCENT: 14.06 %

## 2018-05-10 ENCOUNTER — Other Ambulatory Visit: Payer: Self-pay | Admitting: Internal Medicine

## 2018-05-10 DIAGNOSIS — I714 Abdominal aortic aneurysm, without rupture, unspecified: Secondary | ICD-10-CM

## 2018-05-11 ENCOUNTER — Ambulatory Visit (HOSPITAL_COMMUNITY)
Admission: RE | Admit: 2018-05-11 | Discharge: 2018-05-11 | Disposition: A | Discharge status: Home / Self Care | Payer: Medicare Other | Source: Ambulatory Visit | Attending: Cardiovascular Disease | Admitting: Cardiovascular Disease

## 2018-05-11 DIAGNOSIS — I10 Essential (primary) hypertension: Secondary | ICD-10-CM | POA: Insufficient documentation

## 2018-05-11 DIAGNOSIS — E785 Hyperlipidemia, unspecified: Secondary | ICD-10-CM | POA: Insufficient documentation

## 2018-05-11 DIAGNOSIS — I714 Abdominal aortic aneurysm, without rupture, unspecified: Secondary | ICD-10-CM

## 2018-05-11 DIAGNOSIS — Z87891 Personal history of nicotine dependence: Secondary | ICD-10-CM | POA: Insufficient documentation

## 2018-05-18 ENCOUNTER — Encounter: Payer: Medicare Other | Admitting: Cardiovascular Disease

## 2018-05-22 ENCOUNTER — Other Ambulatory Visit: Payer: Self-pay | Admitting: Cardiovascular Disease

## 2018-05-23 NOTE — Telephone Encounter (Signed)
Rx(s) sent to pharmacy electronically.  

## 2018-06-09 ENCOUNTER — Encounter: Payer: Medicare Other | Admitting: Cardiovascular Disease

## 2018-06-12 DIAGNOSIS — I739 Peripheral vascular disease, unspecified: Secondary | ICD-10-CM | POA: Insufficient documentation

## 2018-06-12 DIAGNOSIS — I495 Sick sinus syndrome: Secondary | ICD-10-CM | POA: Insufficient documentation

## 2018-06-12 NOTE — Progress Notes (Signed)
Patient ID: William [name], male   DOB: January 14, 1924, 84 y.o.   MRN: [id number]    Cardiology Office Note    Date:  06/13/2018   ID:  William [name], DOB Oct 19, 1924, MRN [id number]  PCP:  Deland Pretty, MD  Cardiologist:   Sanda Klein, MD   No chief complaint on file.   History of Present Illness:  William [name] is a 84 y.o. male with sinus node arrest and complete heart block here for follow-up on his dual-chamber permanent pacemaker (Medtronic Revo implanted June 2011), as well as for follow-up of hypertension, hyperlipidemia and abdominal aortic aneurysm status post surgical repair.  Mrs. Gladu feels very well.  His only complaints are related to his knees.  He takes occasional Aleve.  He is still very active.  His pacemaker shows consistent 3.5-4 hours/day activity level over the last 12 months.  This does not even take into account his stationary bike exercises, usually 30 minutes a day.  The patient specifically denies any chest pain at rest exertion, dyspnea at rest or with exertion, orthopnea, paroxysmal nocturnal dyspnea, syncope, palpitations, focal neurological deficits, intermittent claudication, lower extremity edema, unexplained weight gain, cough, hemoptysis or wheezing.  Pacemaker interrogation shows normal device function. His presenting rhythm is AV sequential paced. He has 87% atrial pacing and 99.9% ventricular pacing. Heart rate histogram distribution appears favorable, activity level is 3.5 hours a day over the last year. Lead parameters are unchanged and within normal range. Underlying rhythm today is atrial bradycardia at about 58 bpm with complete heart block and there is no ventricular escape rhythm.  Voltage is 2.82 V, very close to RRT 2.81 V.  As previously documented he has occasional brief episodes of nonsustained ventricular tachycardia, lasting up to 15 beats, consistently asymptomatic.  He has had very brief episodes of atrial tachycardia.  He has not had any  evidence of atrial flutter since the 2-minute episode recorded in August 2017.  Abdominal aorta ultrasound performed last month showed a 2.8 cm x 2.0 cm infrarenal fusiform AAA (surgical repair with aortobiiliac bypass graft).  Past Medical History:  Diagnosis Date  . AAA (abdominal aortic aneurysm) ([facility])   . Atrioventricular block   . Cardiac pacemaker 05/09/10   medtronic dual chamber  . Dyslipidemia   . Nonsustained ventricular tachycardia ([facility])     Past Surgical History:  Procedure Laterality Date  . ABDOMINAL AORTIC ANEURYSM REPAIR  08/02/08   Dr. Donnetta Hutching  . MENISCUS REPAIR     both knees  . RETINAL TEAR REPAIR CRYOTHERAPY      Current Medications: Outpatient Medications Prior to Visit  Medication Sig Dispense Refill  . lisinopril-hydrochlorothiazide (PRINZIDE,ZESTORETIC) 20-12.5 MG tablet Take 1 tablet by mouth daily. MUST KEEP APPOINTMENT 06/13/18 WITH DR Sade Mehlhoff FOR FUTURE REFILLS 90 tablet 0  . Multiple Vitamin (MULTIVITAMIN) tablet Take 1 tablet by mouth daily.    [name] atenolol (TENORMIN) 25 MG tablet Take 1 tablet (25 mg total) by mouth daily. 30 tablet 5  . hydrochlorothiazide (MICROZIDE) 12.5 MG capsule Take 1 capsule (12.5 mg total) by mouth daily. 30 capsule 5   No facility-administered medications prior to visit.      Allergies:   Crestor [rosuvastatin]; Lipitor [atorvastatin]; and Simvastatin   Social History   Socioeconomic History  . Marital status: Married    Spouse name: Not on file  . Number of children: Not on file  . Years of education: Not on file  . Highest education level: Not on file  Occupational History  . Not on file  Social Needs  . Financial resource strain: Not on file  . Food insecurity:    Worry: Not on file    Inability: Not on file  . Transportation needs:    Medical: Not on file    Non-medical: Not on file  Tobacco Use  . Smoking status: Former Research scientist (life sciences)  . Smokeless tobacco: Former Systems developer    Quit date: 11/22/1974  Substance and  Sexual Activity  . Alcohol use: Yes    Alcohol/week: 4.2 oz    Types: 7 Standard drinks or equivalent per week  . Drug use: No  . Sexual activity: Not on file  Lifestyle  . Physical activity:    Days per week: Not on file    Minutes per session: Not on file  . Stress: Not on file  Relationships  . Social connections:    Talks on phone: Not on file    Gets together: Not on file    Attends religious service: Not on file    Active member of club or organization: Not on file    Attends meetings of clubs or organizations: Not on file    Relationship status: Not on file  Other Topics Concern  . Not on file  Social History Narrative  . Not on file       ROS:   Please see the history of present illness.    ROS All other systems reviewed and are negative.   PHYSICAL EXAM:   VS:  BP 130/74   Pulse 89   Ht 5\' 8"  (1.727 m)   Wt 140 lb 6.4 oz (63.7 kg)   BMI 21.35 kg/m      General: Alert, oriented x3, no distress, appears younger than stated age Head: no evidence of trauma, PERRL, EOMI, no exophtalmos or lid lag, no myxedema, no xanthelasma; normal ears, nose and oropharynx Neck: normal jugular venous pulsations and no hepatojugular reflux; brisk carotid pulses without delay and no carotid bruits Chest: clear to auscultation, no signs of consolidation by percussion or palpation, normal fremitus, symmetrical and full respiratory excursions.  Healthy left subclavian pacemaker site Cardiovascular: normal position and quality of the apical impulse, regular rhythm, normal first and paradoxically split second heart sounds, no murmurs, rubs or gallops Abdomen: no tenderness or distention, no masses by palpation, no abnormal pulsatility or arterial bruits, normal bowel sounds, no hepatosplenomegaly Extremities: no clubbing, cyanosis or edema; 2+ radial, ulnar and brachial pulses bilaterally; 2+ right femoral, posterior tibial and dorsalis pedis pulses; 2+ left femoral, posterior tibial and  dorsalis pedis pulses; no subclavian or femoral bruits Neurological: grossly nonfocal Psych: Normal mood and affect   Wt Readings from Last 3 Encounters:  06/13/18 140 lb 6.4 oz (63.7 kg)  04/21/17 141 lb (64 kg)  03/24/16 142 lb 9.6 oz (64.7 kg)      Studies/Labs Reviewed:   EKG:  EKG is  ordered today.  The ekg ordered today demonstrates 100% AV sequential pacing.  During his pacemaker check there is competing sinus rhythm/atrial paced rhythm at 60 bpm Recent Labs: No results found for requested labs within last 8760 hours.   Lipid Panel    Component Value Date/Time   CHOL 182 05/29/2013 0810   TRIG 103 05/29/2013 0810   HDL 39 (L) 05/29/2013 0810   CHOLHDL 4.7 05/29/2013 0810   VLDL 21 05/29/2013 0810   LDLCALC 122 (H) 05/29/2013 0810     ASSESSMENT:    1. Complete heart  block ([facility])   2. SSS (sick sinus syndrome) ([facility])   3. Pacemaker   4. Ventricular tachycardia, nonsustained ([facility])   5. Essential hypertension   6. Abdominal aortic aneurysm (AAA) without rupture ([facility])   7. PAD (peripheral artery disease) ([facility])      PLAN:  In order of problems listed above:  1. CHB: Pacemaker dependent.  There is no ventricular escape rhythm when pacing is taken down to 30 bpm 2. SSS: As before he has roughly 15% or so atrial native rhythm, otherwise 100% AV sequential paced.  Heart rate histogram shows appropriate device settings.  3. PPM: Normal device function, approaching RRT.  We will start doing downloads monthly.  He has an MRI conditional device 4. NSVT: Infrequent, no more than twice monthly, consistently asymptomatic.  He did not feel well on beta-blockers. 5. HTN: Well-controlled 6. AAA s/p Aobifem: Small infrarenal fusiform AAA at the level of the anastomosis, maximum diameter 3.0 cm.   Medication Adjustments/Labs and Tests Ordered: Current medicines are reviewed at length with the patient today.  Concerns regarding medicines are outlined above.  Medication changes,  Labs and Tests ordered today are listed in the Patient Instructions below. Patient Instructions  Dr Sallyanne Kuster recommends that you continue on your current medications as directed. Please refer to the Current Medication list given to you today.  Remote monitoring is used to monitor your Pacemaker or ICD from home. This monitoring reduces the number of office visits required to check your device to one time per year. It allows [place] to keep an eye on the functioning of your device to ensure it is working properly. You are scheduled for a device check from home on Tuesday, August 22nd, 2019. You may send your transmission at any time that day. If you have a wireless device, the transmission will be sent automatically. After your physician reviews your transmission, you will receive a notification with your next transmission date.  To improve our patient care and to more adequately follow your device, [facility] has decided, as a practice, to start following each patient four times a year with your home monitor. This means that you may experience a remote appointment that is close to an in-office appointment with your physician. Your insurance will apply at the same rate as other remote monitoring transmissions.  Dr Sallyanne Kuster recommends that you schedule a follow-up appointment in 12 months with a pacemaker check. You will receive a reminder letter in the mail two months in advance. If you don't receive a letter, please call our office to schedule the follow-up appointment.  If you need a refill on your cardiac medications before your next appointment, please call your pharmacy.    Signed, Sanda Klein, MD  06/13/2018 9:26 AM    [facility] [place], [place], [place]  [address] Phone: [phone]; Fax: [phone]

## 2018-06-13 ENCOUNTER — Ambulatory Visit (INDEPENDENT_AMBULATORY_CARE_PROVIDER_SITE_OTHER): Payer: Medicare Other | Admitting: Cardiovascular Disease

## 2018-06-13 ENCOUNTER — Encounter: Payer: Self-pay | Admitting: Cardiovascular Disease

## 2018-06-13 VITALS — BP 130/74 | HR 89 | Ht 68.0 in | Wt 140.4 lb

## 2018-06-13 DIAGNOSIS — I10 Essential (primary) hypertension: Secondary | ICD-10-CM

## 2018-06-13 DIAGNOSIS — I714 Abdominal aortic aneurysm, without rupture, unspecified: Secondary | ICD-10-CM

## 2018-06-13 DIAGNOSIS — I495 Sick sinus syndrome: Secondary | ICD-10-CM

## 2018-06-13 DIAGNOSIS — I4729 Other ventricular tachycardia: Secondary | ICD-10-CM

## 2018-06-13 DIAGNOSIS — I442 Atrioventricular block, complete: Secondary | ICD-10-CM | POA: Diagnosis not present

## 2018-06-13 DIAGNOSIS — I472 Ventricular tachycardia: Secondary | ICD-10-CM | POA: Diagnosis not present

## 2018-06-13 DIAGNOSIS — I739 Peripheral vascular disease, unspecified: Secondary | ICD-10-CM

## 2018-06-13 DIAGNOSIS — Z950 Presence of cardiac pacemaker: Secondary | ICD-10-CM | POA: Diagnosis not present

## 2018-06-13 NOTE — Patient Instructions (Addendum)
Dr Sallyanne Kuster recommends that you continue on your current medications as directed. Please refer to the Current Medication list given to you today.  Remote monitoring is used to monitor your Pacemaker or ICD from home. This monitoring reduces the number of office visits required to check your device to one time per year. It allows [place] to keep an eye on the functioning of your device to ensure it is working properly. You are scheduled for a device check from home on Tuesday, August 22nd, 2019. You may send your transmission at any time that day. If you have a wireless device, the transmission will be sent automatically. After your physician reviews your transmission, you will receive a notification with your next transmission date.  To improve our patient care and to more adequately follow your device, [facility] has decided, as a practice, to start following each patient four times a year with your home monitor. This means that you may experience a remote appointment that is close to an in-office appointment with your physician. Your insurance will apply at the same rate as other remote monitoring transmissions.  Dr Sallyanne Kuster recommends that you schedule a follow-up appointment in 12 months with a pacemaker check. You will receive a reminder letter in the mail two months in advance. If you don't receive a letter, please call our office to schedule the follow-up appointment.  If you need a refill on your cardiac medications before your next appointment, please call your pharmacy.

## 2018-06-14 ENCOUNTER — Ambulatory Visit (INDEPENDENT_AMBULATORY_CARE_PROVIDER_SITE_OTHER): Payer: Self-pay

## 2018-06-14 ENCOUNTER — Encounter (INDEPENDENT_AMBULATORY_CARE_PROVIDER_SITE_OTHER): Payer: Self-pay | Admitting: Orthopaedic Surgery

## 2018-06-14 ENCOUNTER — Ambulatory Visit (INDEPENDENT_AMBULATORY_CARE_PROVIDER_SITE_OTHER): Payer: Medicare Other | Admitting: Orthopaedic Surgery

## 2018-06-14 VITALS — BP 136/71 | HR 75 | Ht 69.0 in | Wt 140.0 lb

## 2018-06-14 DIAGNOSIS — M25561 Pain in right knee: Secondary | ICD-10-CM

## 2018-06-14 DIAGNOSIS — M170 Bilateral primary osteoarthritis of knee: Secondary | ICD-10-CM

## 2018-06-14 DIAGNOSIS — G8929 Other chronic pain: Secondary | ICD-10-CM

## 2018-06-14 DIAGNOSIS — M25562 Pain in left knee: Secondary | ICD-10-CM

## 2018-06-14 NOTE — Progress Notes (Signed)
Office Visit Note   Patient: William [name]           Date of Birth: [birth year]           MRN: [id number] Visit Date: 06/14/2018              Requested by: Deland Pretty, MD [address] [place], [place] [address] PCP: Deland Pretty, MD   Assessment & Plan: Visit Diagnoses:  1. Bilateral primary osteoarthritis of knee   2. Chronic pain of left knee   3. Chronic pain of right knee     Plan:  #1: We have discussed multiple options for him that are nonsurgical.  He has not favor of surgical intervention at this time. #2: We have discussed corticosteroid injections which had only lasted for 2 weeks.   #3: We discussed Visco supplementation would follow corticosteroid injections and may be his next option. #4: We suggested a brace and he is going to go to the brace shop fitting. #5: Since his cardiologist will not allow him to use Aleve, I have suggested that he calls his cardiologist and see if he would allow the use of Voltaren gel.  If he does he will give [place] a call back and I will call that in to his pharmacy. #6: Continue his exercise bike  Follow-Up Instructions: Return if symptoms worsen or fail to improve.   Face-to-face time spent with patient was greater than 45 minutes.  Greater than 50% of the time was spent in counseling and coordination of care.  Patient was also seen by Dr. Durward Fortes at this time.  He has concurred with the above.  Orders:  No orders of the defined types were placed in this encounter.  No orders of the defined types were placed in this encounter.     Procedures: No procedures performed   Clinical Data: No additional findings.   Subjective: Chief Complaint  Patient presents with  . New Patient (Initial Visit)    BI LAT KNEE PAIN FOR 5 YRS HAS GOTTEN WORSE LAST FEW MONTHS, HISTORY OF TORN MENISCUS IN BOTH KNEES L WAS 43 YRS AGO R WAS 10 YRS AGO    HPI  William [name] is a very pleasant 84 year old white male who is seen today  for evaluation of bilateral knee pain.  He has been seen previously by [facility] for evaluation of bilateral knee pain.  X-rays were performed showing initially bone-on-bone DJD.  Suggestions of Visco supplementation corticosteroid injections were given to him at his last visit but he declined.  He states that previously had meniscal tears more than 20 years ago and had repairs performed but this was many years ago.  He is now having some instability type symptoms he feels unsteady with his knees.  He denies any radicular type symptoms.  His pain is mainly about the possible tibial area.  He does have occasional swelling but this is minimal.  Review of Systems  Constitutional: Negative for fatigue and fever.  HENT: Negative for ear pain.   Eyes: Negative for pain.  Respiratory: Negative for cough and shortness of breath.   Cardiovascular: Negative for leg swelling.  Gastrointestinal: Negative for constipation and diarrhea.  Genitourinary: Negative for difficulty urinating.  Musculoskeletal: Negative for back pain and neck pain.  Skin: Negative for rash.  Allergic/Immunologic: Negative for food allergies.  Neurological: Positive for weakness. Negative for numbness.  Hematological: Bruises/bleeds easily.  Psychiatric/Behavioral: Positive for sleep disturbance.     Objective: Vital  Signs: BP 136/71 (BP Location: Left Arm, Patient Position: Sitting, Cuff Size: Normal)   Pulse 75   Ht 5\' 9"  (1.753 m)   Wt 140 lb (63.5 kg)   BMI 20.67 kg/m   Physical Exam  Constitutional: He is oriented to person, place, and time. He appears well-developed and well-nourished.  HENT:  Mouth/Throat: Oropharynx is clear and moist.  Eyes: Pupils are equal, round, and reactive to light. EOM are normal.  Pulmonary/Chest: Effort normal.  Neurological: He is alert and oriented to person, place, and time.  Skin: Skin is warm and dry.  Psychiatric: He has a normal mood and affect. His behavior is normal.     Ortho Exam  Examination today reveals skin intact bilaterally.  No ecchymosis or erythema noted.  Trace effusions bilaterally.  Has some prominence about the medial compartments of both knees.  He lacks about 3 to 5 degrees of full extension.  Flexion to about 95 degrees.  Crepitance with range of motion bilaterally.  He appears to have some atrophy in the left quad in comparison to the right.  Negative straight leg raise bilaterally.  Good motion of the hips without pain.  Baker's cyst palpable bilaterally.  Specialty Comments:  No specialty comments available.  Imaging: No results found.  Right knee:  1.No acute fracture.  2.Severe medial, mild lateral patellofemoral compartment degenerative changes. Likely degenerative varus alignment of the knee with slight anterior tibial translation with respect to the femur.  3.Chondrocalcinosis.  4.Vascular calcifications.    Left knee:  1.No acute fracture.  2.Severe medial, mild lateral patellofemoral compartment degenerative changes. Degenerative varus alignment and slight anterolateral tibial translation with respect to the femur.  3.Chondrocalcinosis.  4.Vascular calcifications.    PMFS History: Current Outpatient Medications  Medication Sig Dispense Refill  . lisinopril-hydrochlorothiazide (PRINZIDE,ZESTORETIC) 20-12.5 MG tablet Take 1 tablet by mouth daily. MUST KEEP APPOINTMENT 06/13/18 WITH DR CROITORU FOR FUTURE REFILLS 90 tablet 0  . Multiple Vitamin (MULTIVITAMIN) tablet Take 1 tablet by mouth daily.     No current facility-administered medications for this visit.      Patient Active Problem List   Diagnosis Date Noted  . SSS (sick sinus syndrome) ([facility]) 06/12/2018  . PAD (peripheral artery disease) ([facility]) 06/12/2018  . Ventricular tachycardia, nonsustained ([facility]) 04/17/2013  . Atrial standstill 04/17/2013  . Complete heart block ([facility]) 04/17/2013  . Pacemaker 04/17/2013  . Essential hypertension  04/17/2013  . AAA (abdominal aortic aneurysm) ([facility]) 04/17/2013  . Hyperlipidemia 04/17/2013   Past Medical History:  Diagnosis Date  . AAA (abdominal aortic aneurysm) ([facility])   . Atrioventricular block   . Cardiac pacemaker 05/09/10   medtronic dual chamber  . Dyslipidemia   . Nonsustained ventricular tachycardia ([facility])     History reviewed. No pertinent family history.  Past Surgical History:  Procedure Laterality Date  . ABDOMINAL AORTIC ANEURYSM REPAIR  08/02/08   Dr. Donnetta Hutching  . MENISCUS REPAIR     both knees  . RETINAL TEAR REPAIR CRYOTHERAPY     Social History   Occupational History  . Not on file  Tobacco Use  . Smoking status: Former Smoker    Last attempt to quit: 11/22/1974    Years since quitting: 43.5  . Smokeless tobacco: Never Used  Substance and Sexual Activity  . Alcohol use: Yes    Alcohol/week: 4.2 oz    Types: 7 Standard drinks or equivalent per week  . Drug use: No  . Sexual activity: Not on file

## 2018-06-16 ENCOUNTER — Telehealth (INDEPENDENT_AMBULATORY_CARE_PROVIDER_SITE_OTHER): Payer: Self-pay | Admitting: Orthopaedic Surgery

## 2018-06-16 NOTE — Telephone Encounter (Signed)
Please advise 

## 2018-06-16 NOTE — Telephone Encounter (Signed)
Patient left a voicemail stating he saw Aaron Edelman on Tuesday and is requesting a return call to talk about what was discussed at his appointment.

## 2018-06-17 ENCOUNTER — Telehealth (INDEPENDENT_AMBULATORY_CARE_PROVIDER_SITE_OTHER): Payer: Self-pay | Admitting: Orthopaedic Surgery

## 2018-06-17 NOTE — Telephone Encounter (Signed)
Left message waiting for pt to call back

## 2018-06-17 NOTE — Telephone Encounter (Signed)
Patient left a voicemail stating he is waiting for a return call from [place].  Patient states he left a message yesterday, but did not receive a return call.

## 2018-06-21 ENCOUNTER — Other Ambulatory Visit (INDEPENDENT_AMBULATORY_CARE_PROVIDER_SITE_OTHER): Payer: Self-pay | Admitting: Orthopedic Surgery

## 2018-06-21 MED ORDER — DICLOFENAC SODIUM 1 % TD GEL: 2.0000 g | Freq: Four times a day (QID) | TRANSDERMAL | 1 refills | Status: DC

## 2018-06-21 NOTE — Telephone Encounter (Signed)
Spoke to him and sent rx for voltaren gel to pharmacy

## 2018-07-14 ENCOUNTER — Ambulatory Visit (INDEPENDENT_AMBULATORY_CARE_PROVIDER_SITE_OTHER): Payer: Medicare Other | Admitting: *Deleted

## 2018-07-14 DIAGNOSIS — I442 Atrioventricular block, complete: Secondary | ICD-10-CM

## 2018-07-14 LAB — CUP PACEART INCLINIC DEVICE CHECK
Implantable Lead Implant Date: 20110617
Implantable Pulse Generator Implant Date: 20110617
Lead Channel Setting Pacing Amplitude: 2 V
Lead Channel Setting Pacing Pulse Width: 0.6 ms
Lead Channel Setting Sensing Sensitivity: 1.5 mV
MDC IDC LEAD IMPLANT DT: 20110617
MDC IDC LEAD LOCATION: 753859
MDC IDC LEAD LOCATION: 753860
MDC IDC SESS DTM: 20190822143908
MDC IDC SET LEADCHNL RV PACING AMPLITUDE: 2.5 V

## 2018-07-15 ENCOUNTER — Telehealth: Payer: Self-pay

## 2018-07-15 ENCOUNTER — Encounter: Payer: Self-pay | Admitting: Cardiology

## 2018-07-15 NOTE — Progress Notes (Signed)
Remote pacemaker transmission.   

## 2018-07-15 NOTE — Telephone Encounter (Signed)
LMOVM reminding pt to send remote transmission.   

## 2018-07-15 NOTE — Telephone Encounter (Signed)
Spoke with pt informed him that his device had reached ERI informed him that someone would be calling him to set up an apt with Dr. Loletha Grayer to discuss generator change. Pt voiced understanding

## 2018-07-22 ENCOUNTER — Telehealth: Payer: Self-pay

## 2018-07-22 DIAGNOSIS — I472 Ventricular tachycardia: Secondary | ICD-10-CM

## 2018-07-22 DIAGNOSIS — Z45010 Encounter for checking and testing of cardiac pacemaker pulse generator [battery]: Secondary | ICD-10-CM

## 2018-07-22 DIAGNOSIS — Z01812 Encounter for preprocedural laboratory examination: Secondary | ICD-10-CM

## 2018-07-22 DIAGNOSIS — I442 Atrioventricular block, complete: Secondary | ICD-10-CM

## 2018-07-22 DIAGNOSIS — I495 Sick sinus syndrome: Secondary | ICD-10-CM

## 2018-07-22 DIAGNOSIS — I4729 Other ventricular tachycardia: Secondary | ICD-10-CM

## 2018-07-22 NOTE — Telephone Encounter (Signed)
Patient unable to find transportation for procedure next week. Rescheduled for 08/03/18 at 1:30 p. Plan for labs and instruction review will stay the same.

## 2018-07-22 NOTE — Telephone Encounter (Signed)
Patient scheduled for pacemaker generator changeout with Dr Sallyanne Kuster on 07/27/18 at 2:30p. Patient notified and agreed with plan. Advised patient to present for instructions and labs on Tuesday morning, 07/26/18 at 8:30a. Will review pre-procedure instructions with patient at that time. Patient verbalized understanding and agreed with plan.

## 2018-07-22 NOTE — Telephone Encounter (Signed)
-----   Message from Wanda Plump, RN sent at 07/15/2018  4:01 PM EDT ----- Regarding: PPM @ [facility]  Mr. Rounsaville pacemaker reached [facility] 06/23/2018 he needs an apt with [facility] to discuss generator change out  Thanks!

## 2018-07-22 NOTE — Telephone Encounter (Signed)
Follow Up:   Patient following about appointment and patient can be called at home before 12 or after 4 pm but patient can be called on cell phone anytime.

## 2018-07-26 DIAGNOSIS — I442 Atrioventricular block, complete: Secondary | ICD-10-CM | POA: Diagnosis not present

## 2018-07-26 DIAGNOSIS — I495 Sick sinus syndrome: Secondary | ICD-10-CM | POA: Diagnosis not present

## 2018-07-26 DIAGNOSIS — Z45010 Encounter for checking and testing of cardiac pacemaker pulse generator [battery]: Secondary | ICD-10-CM | POA: Diagnosis not present

## 2018-07-26 DIAGNOSIS — I472 Ventricular tachycardia: Secondary | ICD-10-CM | POA: Diagnosis not present

## 2018-07-26 DIAGNOSIS — Z01812 Encounter for preprocedural laboratory examination: Secondary | ICD-10-CM | POA: Diagnosis not present

## 2018-07-27 LAB — CBC
Hematocrit: 42.6 % (ref 37.5–51.0)
Hemoglobin: 14.8 g/dL (ref 13.0–17.7)
MCH: 31.3 pg (ref 26.6–33.0)
MCHC: 34.7 g/dL (ref 31.5–35.7)
MCV: 90 fL (ref 79–97)
PLATELETS: 229 10*3/uL (ref 150–450)
RBC: 4.73 x10E6/uL (ref 4.14–5.80)
RDW: 14.1 % (ref 12.3–15.4)
WBC: 6.4 10*3/uL (ref 3.4–10.8)

## 2018-07-27 LAB — BASIC METABOLIC PANEL
BUN / CREAT RATIO: 28 — AB (ref 10–24)
BUN: 28 mg/dL (ref 10–36)
CHLORIDE: 98 mmol/L (ref 96–106)
CO2: 24 mmol/L (ref 20–29)
Calcium: 9.9 mg/dL (ref 8.6–10.2)
Creatinine, Ser: 1 mg/dL (ref 0.76–1.27)
GFR calc Af Amer: 75 mL/min/{1.73_m2} (ref >59)
GFR calc non Af Amer: 65 mL/min/{1.73_m2} (ref >59)
GLUCOSE: 107 mg/dL — AB (ref 65–99)
Potassium: 4.9 mmol/L (ref 3.5–5.2)
SODIUM: 136 mmol/L (ref 134–144)

## 2018-07-27 LAB — PROTIME-INR
INR: 1.1 (ref 0.8–1.2)
PROTHROMBIN TIME: 11.2 s (ref 9.1–12.0)

## 2018-08-03 ENCOUNTER — Ambulatory Visit (HOSPITAL_COMMUNITY)
Admission: RE | Disposition: A | Discharge status: Home / Self Care | Payer: Self-pay | Source: Ambulatory Visit | Attending: Cardiovascular Disease

## 2018-08-03 ENCOUNTER — Other Ambulatory Visit: Payer: Self-pay

## 2018-08-03 ENCOUNTER — Ambulatory Visit (HOSPITAL_COMMUNITY)
Admission: RE | Admit: 2018-08-03 | Discharge: 2018-08-03 | Disposition: A | Discharge status: Home / Self Care | Payer: Medicare Other | Source: Ambulatory Visit | Attending: Cardiovascular Disease | Admitting: Cardiovascular Disease

## 2018-08-03 DIAGNOSIS — I442 Atrioventricular block, complete: Secondary | ICD-10-CM | POA: Diagnosis present

## 2018-08-03 DIAGNOSIS — Z87891 Personal history of nicotine dependence: Secondary | ICD-10-CM | POA: Insufficient documentation

## 2018-08-03 DIAGNOSIS — E785 Hyperlipidemia, unspecified: Secondary | ICD-10-CM | POA: Diagnosis not present

## 2018-08-03 DIAGNOSIS — I495 Sick sinus syndrome: Secondary | ICD-10-CM | POA: Diagnosis not present

## 2018-08-03 DIAGNOSIS — Z45010 Encounter for checking and testing of cardiac pacemaker pulse generator [battery]: Secondary | ICD-10-CM | POA: Diagnosis not present

## 2018-08-03 DIAGNOSIS — I10 Essential (primary) hypertension: Secondary | ICD-10-CM | POA: Insufficient documentation

## 2018-08-03 HISTORY — PX: PPM GENERATOR CHANGEOUT: EP1233

## 2018-08-03 LAB — SURGICAL PCR SCREEN
MRSA, PCR: NEGATIVE
STAPHYLOCOCCUS AUREUS: NEGATIVE

## 2018-08-03 SURGERY — PPM GENERATOR CHANGEOUT

## 2018-08-03 MED ORDER — MUPIROCIN 2 % EX OINT
1.0000 "application " | TOPICAL_OINTMENT | Freq: Once | CUTANEOUS | Status: AC
  Administered 2018-08-03: 1 via TOPICAL

## 2018-08-03 MED ORDER — SODIUM CHLORIDE 0.9 % IV SOLN: 250.0000 mL | INTRAVENOUS | Status: DC | PRN

## 2018-08-03 MED ORDER — SODIUM CHLORIDE 0.9% FLUSH: 3.0000 mL | Freq: Two times a day (BID) | INTRAVENOUS | Status: DC

## 2018-08-03 MED ORDER — LIDOCAINE HCL (PF) 1 % IJ SOLN
INTRAMUSCULAR | Status: AC
  Filled 2018-08-03: qty 60

## 2018-08-03 MED ORDER — ONDANSETRON HCL 4 MG/2ML IJ SOLN: 4.0000 mg | Freq: Four times a day (QID) | INTRAMUSCULAR | Status: DC | PRN

## 2018-08-03 MED ORDER — CEFAZOLIN SODIUM-DEXTROSE 2-4 GM/100ML-% IV SOLN
INTRAVENOUS | Status: AC
  Filled 2018-08-03: qty 100

## 2018-08-03 MED ORDER — CEFAZOLIN SODIUM-DEXTROSE 2-4 GM/100ML-% IV SOLN
2.0000 g | INTRAVENOUS | Status: AC
  Administered 2018-08-03: 2 g via INTRAVENOUS
  Filled 2018-08-03: qty 100

## 2018-08-03 MED ORDER — SODIUM CHLORIDE 0.9% FLUSH: 3.0000 mL | INTRAVENOUS | Status: DC | PRN

## 2018-08-03 MED ORDER — ACETAMINOPHEN 325 MG PO TABS
325.0000 mg | ORAL_TABLET | ORAL | Status: DC | PRN
  Filled 2018-08-03: qty 2

## 2018-08-03 MED ORDER — CHLORHEXIDINE GLUCONATE 4 % EX LIQD: 60.0000 mL | Freq: Once | CUTANEOUS | Status: DC

## 2018-08-03 MED ORDER — LIDOCAINE HCL (PF) 1 % IJ SOLN
INTRAMUSCULAR | Status: DC | PRN
  Administered 2018-08-03: 40 mL

## 2018-08-03 MED ORDER — SODIUM CHLORIDE 0.9 % IV SOLN
INTRAVENOUS | Status: DC
  Administered 2018-08-03: 13:00:00 via INTRAVENOUS

## 2018-08-03 MED ORDER — SODIUM CHLORIDE 0.9 % IV SOLN
INTRAVENOUS | Status: AC
  Filled 2018-08-03: qty 2

## 2018-08-03 MED ORDER — MUPIROCIN 2 % EX OINT
TOPICAL_OINTMENT | CUTANEOUS | Status: AC
  Administered 2018-08-03: 1 via TOPICAL
  Filled 2018-08-03: qty 22

## 2018-08-03 MED ORDER — SODIUM CHLORIDE 0.9 % IV SOLN
80.0000 mg | INTRAVENOUS | Status: AC
  Administered 2018-08-03: 80 mg

## 2018-08-03 SURGICAL SUPPLY — 5 items
CABLE SURGICAL S-101-97-12 (CABLE) ×3 IMPLANT
IPG PACE AZUR XT DR MRI W1DR01 (Pacemaker) IMPLANT
PACE AZURE XT DR MRI W1DR01 (Pacemaker) ×3 IMPLANT
PAD PRO RADIOLUCENT 2001M-C (PAD) ×3 IMPLANT
TRAY PACEMAKER INSERTION (PACKS) ×3 IMPLANT

## 2018-08-03 NOTE — H&P (Signed)
Cardiology Admission History and Physical:   Patient ID: William [name] MRN: [id number]; DOB: February 11, 1924   Admission date: 08/03/2018  Primary Care Provider: Deland Pretty, MD Primary Cardiologist: No primary care provider on file. Jemma Rasp Primary Electrophysiologist:  n/a  Chief Complaint:  Pacemaker generator ERI  Patient Profile:   William [name] is a 84 y.o. male with sinus node dysfunction and complete heart block here for generator changeout on his dual-chamber permanent pacemaker (Medtronic Revo implanted June 2011), who also has hypertension, hyperlipidemia and abdominal aortic aneurysm status post surgical repair.  History of Present Illness:   William [name] pacemaker reached ERI last month. He is pacemaker dependent. He has no active cardiovascular complaints. Historically, has approximately 90% A pacing and 100% V pacing. Lead parameters have remained normal.  Past Medical History:  Diagnosis Date  . AAA (abdominal aortic aneurysm) ([place])   . Atrioventricular block   . Cardiac pacemaker 05/09/10   medtronic dual chamber  . Dyslipidemia   . Nonsustained ventricular tachycardia ([place])     Past Surgical History:  Procedure Laterality Date  . ABDOMINAL AORTIC ANEURYSM REPAIR  08/02/08   Dr. Donnetta Hutching  . MENISCUS REPAIR     both knees  . RETINAL TEAR REPAIR CRYOTHERAPY       Medications Prior to Admission: Prior to Admission medications   Medication Sig Start Date End Date Taking? Authorizing Provider  lisinopril-hydrochlorothiazide (PRINZIDE,ZESTORETIC) 20-12.5 MG tablet Take 1 tablet by mouth daily. MUST KEEP APPOINTMENT 06/13/18 WITH DR Sallyanne Kuster FOR FUTURE REFILLS 05/23/18  Yes Kalmen Lollar, MD  Multiple Vitamin (MULTIVITAMIN) tablet Take 1 tablet by mouth daily.   Yes [provider]  diclofenac sodium (VOLTAREN) 1 % GEL Apply 2-4 g topically 4 (four) times daily. Patient not taking: Reported on 07/22/2018 06/21/18   Cherylann Ratel, PA-C     Allergies:      Allergies  Allergen Reactions  . Statins Other (See Comments)    Myalgia: Rosuvastatin, Atorvastatin, Simvastatin    Social History:   Social History   Socioeconomic History  . Marital status: Married    Spouse name: Not on file  . Number of children: Not on file  . Years of education: Not on file  . Highest education level: Not on file  Occupational History  . Not on file  Social Needs  . Financial resource strain: Not on file  . Food insecurity:    Worry: Not on file    Inability: Not on file  . Transportation needs:    Medical: Not on file    Non-medical: Not on file  Tobacco Use  . Smoking status: Former Smoker    Last attempt to quit: 11/22/1974    Years since quitting: 43.7  . Smokeless tobacco: Never Used  Substance and Sexual Activity  . Alcohol use: Yes    Alcohol/week: 7.0 standard drinks    Types: 7 Standard drinks or equivalent per week  . Drug use: No  . Sexual activity: Not on file  Lifestyle  . Physical activity:    Days per week: Not on file    Minutes per session: Not on file  . Stress: Not on file  Relationships  . Social connections:    Talks on phone: Not on file    Gets together: Not on file    Attends religious service: Not on file    Active member of club or organization: Not on file    Attends meetings of clubs or organizations: Not on file  Relationship status: Not on file  . Intimate partner violence:    Fear of current or ex partner: Not on file    Emotionally abused: Not on file    Physically abused: Not on file    Forced sexual activity: Not on file  Other Topics Concern  . Not on file  Social History Narrative  . Not on file    Family History:   The patient's family history is negative for CV disorders or inheritable disorders.   ROS:  Please see the history of present illness.  All other ROS reviewed and negative.     Physical Exam/Data:   Vitals:   08/03/18 1138  BP: (!) 146/74  Pulse: 65  Resp: 18  Temp:  97.7 F (36.5 C)  TempSrc: Oral  SpO2: 95%  Weight: 63.5 kg  Height: 5\' 8"  (1.727 m)   No intake or output data in the 24 hours ending 08/03/18 1305 Filed Weights   08/03/18 1138  Weight: 63.5 kg   Body mass index is 21.29 kg/m.  General:  Well nourished, well developed, in no acute distress, very lean HEENT: normal Lymph: no adenopathy Neck: no JVD Endocrine:  No thryomegaly Vascular: No carotid bruits; FA pulses 2+ bilaterally without bruits  Cardiac:  normal S1, paradoxically split S2; RRR; no murmur  Lungs:  clear to auscultation bilaterally, no wheezing, rhonchi or rales  Abd: soft, nontender, no hepatomegaly  Ext: no edema Musculoskeletal:  No deformities, BUE and BLE strength normal and equal Skin: warm and dry  Neuro:  CNs 2-12 intact, no focal abnormalities noted Psych:  Normal affect  Healthy left subclavian pacemaker site.   EKG:  The ECG that was done 07/22 was personally reviewed and demonstrates AV sequential paced rhythm   Radiology/Studies:  No results found.  Assessment and Plan:   Pacemaker dependent (complete heart block) patient here for dual chamber generator changeout. This procedure has been fully reviewed with the patient and written informed consent has been obtained. If available, plan to use a TyRx pouch (recently limited availability due to manufacturer issues).   For questions or updates, please contact [place] Please consult [email or web address] for contact info under     Signed, Sanda Klein, MD  08/03/2018 1:05 PM

## 2018-08-03 NOTE — Discharge Instructions (Signed)

## 2018-08-03 NOTE — Op Note (Signed)
Procedure report  Procedure performed:  Dual chamber pacemaker generator changeout  Reason for procedure:  1. Device generator at elective replacement interval  2. Complete heart block 3. Sick sinus syndrome. Procedure performed by:  Sanda Klein, MD  Complications:  None  Estimated blood loss:  <5 mL  Medications administered during procedure:  Ancef 2 g intravenously, lidocaine 1% 30 mL locally Device details:  New Generator Medtronic Azure XT DR model number [id number], serial number [id number] Right atrial lead (chronic) Medtronic, model number N2303978, serial number [id number] (implanted 05/09/2010) Right ventricular lead (chronic)  Medtronic, model number [id number], serial number [id number] (implanted 05/09/2010)  Explanted generator Medtronic Revo,  model number RVDR01, serial number  [id number] (implanted 05/09/2010)  Procedure details:  After the risks and benefits of the procedure were discussed the patient provided informed consent. She was brought to the cardiac catheter lab in the fasting state. The patient was prepped and draped in usual sterile fashion. Local anesthesia with 1% lidocaine was administered to to the left infraclavicular area. A 5-6cm horizontal incision was made parallel with and 2-3 cm caudal to the left clavicle, in the area of an old scar.Using minimal electrocautery and mostly sharp and blunt dissection the prepectoral pocket was opened carefully to avoid injury to the loops of chronic leads. Extensive dissection was not necessary. The device was explanted. The pocket was carefully inspected for hemostasis and flushed with copious amounts of antibiotic solution.  The leads were disconnected from the old generator and testing of the lead parameters via telemetry showed excellent values. The new generator was connected to the chronic leads, with appropriate pacing noted.   The entire system was then carefully inserted in the pocket with care been taking that the leads  and device assumed a comfortable position without pressure on the incision. Great care was taken that the leads be located deep to the generator. The pocket was then closed in layers using 2 layers of 2-0 Vicryl and cutaneous staples after which a sterile dressing was applied.   At the end of the procedure the following lead parameters were encountered:   Right atrial lead sensed P waves 1.3 mV, impedance 475 ohms, threshold 0.75 at 0.4 ms pulse width.  Right ventricular lead sensed R waves  NONE detected, impedance 551 ohms, threshold 1.0 at 0.4 ms pulse width.  Sanda Klein, MD, [facility] [phone] office [phone] pager

## 2018-08-04 ENCOUNTER — Encounter (HOSPITAL_COMMUNITY): Payer: Self-pay | Admitting: Cardiovascular Disease

## 2018-08-04 MED FILL — Gentamicin Sulfate Inj 40 MG/ML: INTRAMUSCULAR | Qty: 80 | Status: AC

## 2018-08-08 DIAGNOSIS — Z85828 Personal history of other malignant neoplasm of skin: Secondary | ICD-10-CM | POA: Diagnosis not present

## 2018-08-08 DIAGNOSIS — C4442 Squamous cell carcinoma of skin of scalp and neck: Secondary | ICD-10-CM | POA: Diagnosis not present

## 2018-08-08 DIAGNOSIS — D485 Neoplasm of uncertain behavior of skin: Secondary | ICD-10-CM | POA: Diagnosis not present

## 2018-08-08 DIAGNOSIS — C4441 Basal cell carcinoma of skin of scalp and neck: Secondary | ICD-10-CM | POA: Diagnosis not present

## 2018-08-11 DIAGNOSIS — C4442 Squamous cell carcinoma of skin of scalp and neck: Secondary | ICD-10-CM | POA: Diagnosis not present

## 2018-08-11 DIAGNOSIS — Z85828 Personal history of other malignant neoplasm of skin: Secondary | ICD-10-CM | POA: Diagnosis not present

## 2018-08-16 ENCOUNTER — Ambulatory Visit (INDEPENDENT_AMBULATORY_CARE_PROVIDER_SITE_OTHER): Payer: Medicare Other | Admitting: *Deleted

## 2018-08-16 DIAGNOSIS — I442 Atrioventricular block, complete: Secondary | ICD-10-CM

## 2018-08-16 LAB — CUP PACEART INCLINIC DEVICE CHECK
Battery Remaining Longevity: 135 mo
Battery Voltage: 3.21 V
Brady Statistic AP VP Percent: 62.34 %
Brady Statistic RA Percent Paced: 62.39 %
Brady Statistic RV Percent Paced: 99.48 %
Date Time Interrogation Session: 20190924162317
Implantable Lead Location: 753859
Implantable Lead Location: 753860
Implantable Pulse Generator Implant Date: 20190911
Lead Channel Impedance Value: 361 Ohm
Lead Channel Pacing Threshold Amplitude: 0.625 V
Lead Channel Pacing Threshold Pulse Width: 0.4 ms
Lead Channel Sensing Intrinsic Amplitude: 1.625 mV
Lead Channel Sensing Intrinsic Amplitude: 1.875 mV
Lead Channel Setting Pacing Amplitude: 1.5 V
Lead Channel Setting Pacing Amplitude: 2.5 V
MDC IDC LEAD IMPLANT DT: 20110617
MDC IDC LEAD IMPLANT DT: 20110617
MDC IDC MSMT LEADCHNL RA IMPEDANCE VALUE: 437 Ohm
MDC IDC MSMT LEADCHNL RV IMPEDANCE VALUE: 399 Ohm
MDC IDC MSMT LEADCHNL RV IMPEDANCE VALUE: 532 Ohm
MDC IDC MSMT LEADCHNL RV PACING THRESHOLD AMPLITUDE: 1.125 V
MDC IDC MSMT LEADCHNL RV PACING THRESHOLD PULSEWIDTH: 0.4 ms
MDC IDC SET LEADCHNL RV PACING PULSEWIDTH: 0.4 ms
MDC IDC SET LEADCHNL RV SENSING SENSITIVITY: 4 mV
MDC IDC STAT BRADY AP VS PERCENT: 0.01 %
MDC IDC STAT BRADY AS VP PERCENT: 37.15 %
MDC IDC STAT BRADY AS VS PERCENT: 0.51 %

## 2018-08-16 NOTE — Progress Notes (Signed)
Wound check appointment. Staples removed. Wound without redness or edema. Incision edges approximated, wound well healed. Normal device function. Thresholds, sensing, and impedances consistent with implant measurements. Device programmed at chronic output values. Histogram distribution appropriate for patient and level of activity. No mode switches or high ventricular rates noted. Patient educated about wound care. ROV/ [facility] 11/02/2018. Pt given blue Sync pamplet to download app when he gets home. Was unable to download the app in office due to pt not having apple ID.

## 2018-08-18 ENCOUNTER — Other Ambulatory Visit: Payer: Self-pay | Admitting: Cardiovascular Disease

## 2018-08-18 LAB — CUP PACEART REMOTE DEVICE CHECK
Battery Voltage: 2.82 V
Brady Statistic AP VP Percent: 27.2 %
Brady Statistic RA Percent Paced: 25.41 %
Brady Statistic RV Percent Paced: 99.09 %
Implantable Pulse Generator Implant Date: 20110617
Lead Channel Impedance Value: 448 Ohm
Lead Channel Sensing Intrinsic Amplitude: 7.113 mV
Lead Channel Setting Sensing Sensitivity: 1.5 mV
MDC IDC MSMT LEADCHNL RA SENSING INTR AMPL: 1.775 mV
MDC IDC MSMT LEADCHNL RV IMPEDANCE VALUE: 496 Ohm
MDC IDC SESS DTM: 20190823195753
MDC IDC SET LEADCHNL RV PACING AMPLITUDE: 2.5 V
MDC IDC SET LEADCHNL RV PACING PULSEWIDTH: 0.6 ms
MDC IDC STAT BRADY AP VS PERCENT: 0.01 %
MDC IDC STAT BRADY AS VP PERCENT: 72.19 %
MDC IDC STAT BRADY AS VS PERCENT: 0.6 %

## 2018-08-18 NOTE — Telephone Encounter (Signed)
Rx request sent to pharmacy.  

## 2018-08-25 DIAGNOSIS — Z4801 Encounter for change or removal of surgical wound dressing: Secondary | ICD-10-CM | POA: Diagnosis not present

## 2018-08-26 DIAGNOSIS — M170 Bilateral primary osteoarthritis of knee: Secondary | ICD-10-CM | POA: Diagnosis not present

## 2018-09-09 DIAGNOSIS — M170 Bilateral primary osteoarthritis of knee: Secondary | ICD-10-CM | POA: Diagnosis not present

## 2018-09-12 ENCOUNTER — Telehealth: Payer: Self-pay | Admitting: Cardiovascular Disease

## 2018-09-12 MED ORDER — METOPROLOL SUCCINATE ER 25 MG PO TB24: 25.0000 mg | ORAL_TABLET | Freq: Every day | ORAL | 3 refills | Status: DC

## 2018-09-12 NOTE — Telephone Encounter (Signed)
Rx(s) sent to pharmacy electronically.  

## 2018-09-12 NOTE — Telephone Encounter (Signed)
I spoke with him. Please call in metoprolol succinate 25 mg once daily.  Call back with BP readings in one week MCr

## 2018-09-12 NOTE — Telephone Encounter (Signed)
New Message   Pt c/o BP issue:  1. What are your last 5 BP readings? 170/90, 160/85, 171/92 2. Are you having any other symptoms (ex. Dizziness, headache, blurred vision, passed out)? Only complaints is pressure in head and eye 3. What is your medication issue? No

## 2018-09-12 NOTE — Telephone Encounter (Signed)
Patient states that his BP shot up late Saturday and has continued since. 160/85 with pulse of 92 is patient's reading this am. Patient has not interrupted his BP medication.

## 2018-09-15 DIAGNOSIS — I10 Essential (primary) hypertension: Secondary | ICD-10-CM | POA: Diagnosis not present

## 2018-09-16 DIAGNOSIS — M170 Bilateral primary osteoarthritis of knee: Secondary | ICD-10-CM | POA: Diagnosis not present

## 2018-09-19 DIAGNOSIS — E039 Hypothyroidism, unspecified: Secondary | ICD-10-CM | POA: Diagnosis not present

## 2018-09-19 DIAGNOSIS — N401 Enlarged prostate with lower urinary tract symptoms: Secondary | ICD-10-CM | POA: Diagnosis not present

## 2018-09-19 DIAGNOSIS — M1990 Unspecified osteoarthritis, unspecified site: Secondary | ICD-10-CM | POA: Diagnosis not present

## 2018-09-19 DIAGNOSIS — Z0000 Encounter for general adult medical examination without abnormal findings: Secondary | ICD-10-CM | POA: Diagnosis not present

## 2018-09-19 DIAGNOSIS — G4452 New daily persistent headache (NDPH): Secondary | ICD-10-CM | POA: Diagnosis not present

## 2018-09-19 DIAGNOSIS — Z789 Other specified health status: Secondary | ICD-10-CM | POA: Diagnosis not present

## 2018-09-19 DIAGNOSIS — R0989 Other specified symptoms and signs involving the circulatory and respiratory systems: Secondary | ICD-10-CM | POA: Diagnosis not present

## 2018-09-19 DIAGNOSIS — I10 Essential (primary) hypertension: Secondary | ICD-10-CM | POA: Diagnosis not present

## 2018-09-19 DIAGNOSIS — N529 Male erectile dysfunction, unspecified: Secondary | ICD-10-CM | POA: Diagnosis not present

## 2018-09-19 DIAGNOSIS — Z950 Presence of cardiac pacemaker: Secondary | ICD-10-CM | POA: Diagnosis not present

## 2018-09-19 DIAGNOSIS — E7800 Pure hypercholesterolemia, unspecified: Secondary | ICD-10-CM | POA: Diagnosis not present

## 2018-09-23 DIAGNOSIS — M170 Bilateral primary osteoarthritis of knee: Secondary | ICD-10-CM | POA: Diagnosis not present

## 2018-09-28 ENCOUNTER — Telehealth: Payer: Self-pay | Admitting: Cardiovascular Disease

## 2018-09-28 NOTE — Telephone Encounter (Signed)
Received records from [facility] on 09/28/18, Appt 11/02/18 @ 8:40AM.[place]

## 2018-10-03 DIAGNOSIS — H0100B Unspecified blepharitis left eye, upper and lower eyelids: Secondary | ICD-10-CM | POA: Diagnosis not present

## 2018-10-03 DIAGNOSIS — H04123 Dry eye syndrome of bilateral lacrimal glands: Secondary | ICD-10-CM | POA: Diagnosis not present

## 2018-10-03 DIAGNOSIS — H0100A Unspecified blepharitis right eye, upper and lower eyelids: Secondary | ICD-10-CM | POA: Diagnosis not present

## 2018-11-02 ENCOUNTER — Encounter (INDEPENDENT_AMBULATORY_CARE_PROVIDER_SITE_OTHER): Payer: Self-pay

## 2018-11-02 ENCOUNTER — Ambulatory Visit (INDEPENDENT_AMBULATORY_CARE_PROVIDER_SITE_OTHER): Payer: Medicare Other | Admitting: Cardiovascular Disease

## 2018-11-02 ENCOUNTER — Encounter: Payer: Self-pay | Admitting: Cardiovascular Disease

## 2018-11-02 VITALS — BP 114/72 | HR 86 | Ht 68.0 in | Wt 140.0 lb

## 2018-11-02 DIAGNOSIS — I714 Abdominal aortic aneurysm, without rupture, unspecified: Secondary | ICD-10-CM

## 2018-11-02 DIAGNOSIS — I472 Ventricular tachycardia: Secondary | ICD-10-CM | POA: Diagnosis not present

## 2018-11-02 DIAGNOSIS — I10 Essential (primary) hypertension: Secondary | ICD-10-CM

## 2018-11-02 DIAGNOSIS — I442 Atrioventricular block, complete: Secondary | ICD-10-CM

## 2018-11-02 DIAGNOSIS — I495 Sick sinus syndrome: Secondary | ICD-10-CM

## 2018-11-02 DIAGNOSIS — Z950 Presence of cardiac pacemaker: Secondary | ICD-10-CM

## 2018-11-02 DIAGNOSIS — I4729 Other ventricular tachycardia: Secondary | ICD-10-CM

## 2018-11-02 NOTE — Patient Instructions (Signed)
Medication Instructions:  Dr Sallyanne Kuster recommends that you continue on your current medications as directed. Please refer to the Current Medication list given to you today.  If you need a refill on your cardiac medications before your next appointment, please call your pharmacy.   Testing/Procedures: Remote monitoring is used to monitor your Pacemaker of ICD from home. This monitoring reduces the number of office visits required to check your device to one time per year. It allows [place] to keep an eye on the functioning of your device to ensure it is working properly. You are scheduled for a device check from home on Wednesday, March 11th, 2020. You may send your transmission at any time that day. If you have a wireless device, the transmission will be sent automatically. After your physician reviews your transmission, you will receive a postcard with your next transmission date.  To improve our patient care and to more adequately follow your device, [facility] has decided, as a practice, to start following each patient four times a year with your home monitor. This means that you may experience a remote appointment that is close to an in-office appointment with your physician. Your insurance will apply at the same rate as other remote monitoring transmissions.  Follow-Up: At [facility], you and your health needs are our priority.  As part of our continuing mission to provide you with exceptional heart care, we have created designated Provider Care Teams.  These Care Teams include your primary Cardiologist (physician) and Advanced Practice Providers (APPs -  Physician Assistants and Nurse Practitioners) who all work together to provide you with the care you need, when you need it. You will need a follow up appointment in 12 months.  Please call our office 2 months in advance to schedule this appointment.  You may see Sanda Klein, MD or one of the following Advanced Practice Providers on your  designated Care Team: [place], [place] . Fabian Sharp, PA-C . You will receive a reminder letter in the mail two months in advance. If you don't receive a letter, please call our office to schedule the follow-up appointment.

## 2018-11-02 NOTE — Progress Notes (Signed)
Patient ID: William [name], male   DOB: [birth year], 84 y.o.   MRN: [id number]    Cardiology Office Note    Date:  11/02/2018   ID:  William [name], DOB 02/26/1924, MRN [id number]  PCP:  Deland Pretty, MD  Cardiologist:   Sanda Klein, MD   Chief Complaint  Patient presents with  . Pacemaker Check    History of Present Illness:  William [name] is a 84 y.o. male with sinus node arrest and complete heart block here for follow-up on his dual-chamber permanent pacemaker (Medtronic Revo implanted June 2011), as well as for follow-up of hypertension, hyperlipidemia and abdominal aortic aneurysm status post surgical repair.  William [name] continues to do very well from a cardiovascular point of view.  He exercises on a stationary bike regularly.  Activity level is constant at 4.5 hours/day, which is excellent for a nonagenarian.  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  His knees are "bone-on-bone".  He takes occasional naproxen, no more than once weekly.  He continues to live independently.  The patient specifically denies any chest pain at rest exertion, dyspnea at rest or with exertion, orthopnea, paroxysmal nocturnal dyspnea, syncope, palpitations, focal neurological deficits, intermittent claudication, lower extremity edema, unexplained weight gain, cough, hemoptysis or wheezing.  The pacemaker site has healed very nicely.  Pacemaker interrogation shows normal device function.  He presents with AV sequential pacing and has 99.6 ventricular pacing and 64.7% atrial pacing with an appropriate heart rate histogram distribution. Lead parameters are unchanged and within normal range.  He does have sinus bradycardia in the 50s, but complete heart block without any ventricular escape rhythm when pacing is taken down to 30 bpm.  He is pacemaker dependent.  His current generator which was changed out in August 2019 has 10.8 years of estimated longevity.  As we have  previously seen him.  He has about once a month episode of brief nonsustained ventricular tachycardia the last 1 or 2 seconds and has always been asymptomatic.  He has not had any evidence of atrial flutter since the 2-minute episode recorded in August 2017.  Abdominal aorta ultrasound performed June 2019 showed a 2.8 cm x 2.0 cm infrarenal fusiform AAA (surgical repair with aortobiiliac bypass graft).  He has no abdominal complaints.  Past Medical History:  Diagnosis Date  . AAA (abdominal aortic aneurysm) ([place])   . Atrioventricular block   . Cardiac pacemaker 05/09/10   medtronic dual chamber  . Dyslipidemia   . Nonsustained ventricular tachycardia ([place])     Past Surgical History:  Procedure Laterality Date  . ABDOMINAL AORTIC ANEURYSM REPAIR  08/02/08   Dr. Donnetta Hutching  . MENISCUS REPAIR     both knees  . PPM GENERATOR CHANGEOUT N/A 08/03/2018   Procedure: PPM GENERATOR CHANGEOUT;  Surgeon: Sanda Klein, MD;  Location: [facility];  Service: Cardiovascular;  Laterality: N/A;  . RETINAL TEAR REPAIR CRYOTHERAPY      Current Medications: Outpatient Medications Prior to Visit  Medication Sig Dispense Refill  . lisinopril-hydrochlorothiazide (PRINZIDE,ZESTORETIC) 20-12.5 MG tablet TAKE 1 TABLET BY MOUTH DAILY. MUST KEEP APPOINTMENT 06/13/18 WITH DR Ezio Wieck FOR FUTURE REFILLS 90 tablet 0  . metoprolol succinate (TOPROL XL) 25 MG 24 hr tablet Take 1 tablet (25 mg total) by mouth daily. 90 tablet 3  . Multiple Vitamin (MULTIVITAMIN) tablet Take 1 tablet by mouth daily.    . diclofenac sodium (VOLTAREN) 1 % GEL Apply 2-4  g topically 4 (four) times daily. (Patient not taking: Reported on 07/22/2018) 5 Tube 1   No facility-administered medications prior to visit.      Allergies:   Statins   Social History   Socioeconomic History  . Marital status: Married    Spouse name: Not on file  . Number of children: Not on file  . Years of education: Not on file  . Highest education level:  Not on file  Occupational History  . Not on file  Social Needs  . Financial resource strain: Not on file  . Food insecurity:    Worry: Not on file    Inability: Not on file  . Transportation needs:    Medical: Not on file    Non-medical: Not on file  Tobacco Use  . Smoking status: Former Smoker    Last attempt to quit: 11/22/1974    Years since quitting: 43.9  . Smokeless tobacco: Never Used  Substance and Sexual Activity  . Alcohol use: Yes    Alcohol/week: 7.0 standard drinks    Types: 7 Standard drinks or equivalent per week  . Drug use: No  . Sexual activity: Not on file  Lifestyle  . Physical activity:    Days per week: Not on file    Minutes per session: Not on file  . Stress: Not on file  Relationships  . Social connections:    Talks on phone: Not on file    Gets together: Not on file    Attends religious service: Not on file    Active member of club or organization: Not on file    Attends meetings of clubs or organizations: Not on file    Relationship status: Not on file  Other Topics Concern  . Not on file  Social History Narrative  . Not on file     ROS:   Please see the history of present illness.    ROS all other systems are reviewed and are negative   PHYSICAL EXAM:   VS:  BP 114/72   Pulse 86   Ht 5\' 8"  (1.727 m)   Wt 140 lb (63.5 kg)   BMI 21.29 kg/m      General: Alert, oriented x3, no distress, well-healed left subclavian pacemaker site Head: no evidence of trauma, PERRL, EOMI, no exophtalmos or lid lag, no myxedema, no xanthelasma; normal ears, nose and oropharynx Neck: normal jugular venous pulsations and no hepatojugular reflux; brisk carotid pulses without delay and no carotid bruits Chest: clear to auscultation, no signs of consolidation by percussion or palpation, normal fremitus, symmetrical and full respiratory excursions Cardiovascular: normal position and quality of the apical impulse, regular rhythm, normal first and paradoxically  split second heart sounds, no murmurs, rubs or gallops Abdomen: no tenderness or distention, no masses by palpation, no abnormal pulsatility or arterial bruits, normal bowel sounds, no hepatosplenomegaly Extremities: no clubbing, cyanosis or edema; 2+ radial, ulnar and brachial pulses bilaterally; 2+ right femoral, posterior tibial and dorsalis pedis pulses; 2+ left femoral, posterior tibial and dorsalis pedis pulses; no subclavian or femoral bruits Neurological: grossly nonfocal Psych: Normal mood and affect    Wt Readings from Last 3 Encounters:  11/02/18 140 lb (63.5 kg)  08/03/18 140 lb (63.5 kg)  06/14/18 140 lb (63.5 kg)      Studies/Labs Reviewed:   EKG:  EKG is  ordered today.  Shows atrial paced, ventricular paced rhythm with occasional PVCs  Recent Labs: 07/26/2018: BUN 28; Creatinine, Ser 1.00; Hemoglobin  14.8; Platelets 229; Potassium 4.9; Sodium 136   Lipid Panel    Component Value Date/Time   CHOL 182 05/29/2013 0810   TRIG 103 05/29/2013 0810   HDL 39 (L) 05/29/2013 0810   CHOLHDL 4.7 05/29/2013 0810   VLDL 21 05/29/2013 0810   LDLCALC 122 (H) 05/29/2013 0810     ASSESSMENT:    1. Complete heart block ([place])   2. SSS (sick sinus syndrome) ([place])   3. Pacemaker   4. NSVT (nonsustained ventricular tachycardia) ([place])   5. Essential hypertension   6. AAA (abdominal aortic aneurysm) without rupture ([facility])      PLAN:  In order of problems listed above:  1. CHB: Pacemaker dependent. 2. SSS: He does have underlying sinus rhythm, appropriate heart rate histogram distribution 3. PPM: Normal device function.  Remote downloads every 3 months and yearly office visits 4. NSVT: She only occurs once a month and is asymptomatic.  He was poorly tolerant of beta-blockers in the past. 5. HTN: Well-controlled. 6. AAA s/p Aobifem: Asymptomatic small infrarenal fusiform aneurysm at the level of the anastomosis of his previous aortobifemoral bypass.   Medication  Adjustments/Labs and Tests Ordered: Current medicines are reviewed at length with the patient today.  Concerns regarding medicines are outlined above.  Medication changes, Labs and Tests ordered today are listed in the Patient Instructions below. Patient Instructions  Medication Instructions:  Dr Sallyanne Kuster recommends that you continue on your current medications as directed. Please refer to the Current Medication list given to you today.  If you need a refill on your cardiac medications before your next appointment, please call your pharmacy.   Testing/Procedures: Remote monitoring is used to monitor your Pacemaker of ICD from home. This monitoring reduces the number of office visits required to check your device to one time per year. It allows [place] to keep an eye on the functioning of your device to ensure it is working properly. You are scheduled for a device check from home on Wednesday, March 11th, 2020. You may send your transmission at any time that day. If you have a wireless device, the transmission will be sent automatically. After your physician reviews your transmission, you will receive a postcard with your next transmission date.  To improve our patient care and to more adequately follow your device, [facility] has decided, as a practice, to start following each patient four times a year with your home monitor. This means that you may experience a remote appointment that is close to an in-office appointment with your physician. Your insurance will apply at the same rate as other remote monitoring transmissions.  Follow-Up: At [facility], you and your health needs are our priority.  As part of our continuing mission to provide you with exceptional heart care, we have created designated Provider Care Teams.  These Care Teams include your primary Cardiologist (physician) and Advanced Practice Providers (APPs -  Physician Assistants and Nurse Practitioners) who all work together to  provide you with the care you need, when you need it. You will need a follow up appointment in 12 months.  Please call our office 2 months in advance to schedule this appointment.  You may see Sanda Klein, MD or one of the following Advanced Practice Providers on your designated Care Team: [place], [place] . Fabian Sharp, PA-C . You will receive a reminder letter in the mail two months in advance. If you don't receive a letter, please call our office to schedule the follow-up appointment.  Signed, Sanda Klein, MD  11/02/2018 3:32 PM    [address], [place], [place]  [address] Phone: [phone]; Fax: [phone]

## 2018-11-09 ENCOUNTER — Ambulatory Visit: Payer: Medicare Other | Admitting: Neurology

## 2018-11-10 ENCOUNTER — Other Ambulatory Visit: Payer: Self-pay | Admitting: Cardiology

## 2018-12-13 LAB — CUP PACEART INCLINIC DEVICE CHECK
Implantable Lead Implant Date: 20110617
Implantable Lead Implant Date: 20110617
Implantable Lead Location: 753860
Implantable Pulse Generator Implant Date: 20190911
MDC IDC LEAD LOCATION: 753859
MDC IDC SESS DTM: 20200121143129

## 2018-12-26 DIAGNOSIS — L570 Actinic keratosis: Secondary | ICD-10-CM | POA: Diagnosis not present

## 2018-12-26 DIAGNOSIS — Z85828 Personal history of other malignant neoplasm of skin: Secondary | ICD-10-CM | POA: Diagnosis not present

## 2019-02-01 ENCOUNTER — Encounter: Payer: Medicare Other | Admitting: *Deleted

## 2019-06-12 DIAGNOSIS — I10 Essential (primary) hypertension: Secondary | ICD-10-CM | POA: Diagnosis not present

## 2019-06-12 DIAGNOSIS — R197 Diarrhea, unspecified: Secondary | ICD-10-CM | POA: Diagnosis not present

## 2019-06-13 DIAGNOSIS — R197 Diarrhea, unspecified: Secondary | ICD-10-CM | POA: Diagnosis not present

## 2019-06-14 DIAGNOSIS — R197 Diarrhea, unspecified: Secondary | ICD-10-CM | POA: Diagnosis not present

## 2019-06-22 ENCOUNTER — Other Ambulatory Visit: Payer: Self-pay

## 2019-06-22 DIAGNOSIS — K529 Noninfective gastroenteritis and colitis, unspecified: Secondary | ICD-10-CM | POA: Diagnosis not present

## 2019-06-22 DIAGNOSIS — R197 Diarrhea, unspecified: Secondary | ICD-10-CM | POA: Diagnosis not present

## 2019-06-27 DIAGNOSIS — K529 Noninfective gastroenteritis and colitis, unspecified: Secondary | ICD-10-CM | POA: Diagnosis not present

## 2019-07-26 DIAGNOSIS — R197 Diarrhea, unspecified: Secondary | ICD-10-CM | POA: Diagnosis not present

## 2019-11-06 ENCOUNTER — Other Ambulatory Visit: Payer: Self-pay

## 2019-11-06 ENCOUNTER — Ambulatory Visit (INDEPENDENT_AMBULATORY_CARE_PROVIDER_SITE_OTHER): Payer: Medicare Other | Admitting: Cardiovascular Disease

## 2019-11-06 ENCOUNTER — Telehealth: Payer: Self-pay

## 2019-11-06 ENCOUNTER — Encounter: Payer: Self-pay | Admitting: Cardiovascular Disease

## 2019-11-06 VITALS — BP 116/64 | HR 87 | Ht 68.0 in | Wt 133.0 lb

## 2019-11-06 DIAGNOSIS — I739 Peripheral vascular disease, unspecified: Secondary | ICD-10-CM

## 2019-11-06 DIAGNOSIS — I472 Ventricular tachycardia: Secondary | ICD-10-CM

## 2019-11-06 DIAGNOSIS — I10 Essential (primary) hypertension: Secondary | ICD-10-CM

## 2019-11-06 DIAGNOSIS — I4729 Other ventricular tachycardia: Secondary | ICD-10-CM

## 2019-11-06 DIAGNOSIS — I495 Sick sinus syndrome: Secondary | ICD-10-CM

## 2019-11-06 DIAGNOSIS — I4892 Unspecified atrial flutter: Secondary | ICD-10-CM | POA: Diagnosis not present

## 2019-11-06 DIAGNOSIS — Z950 Presence of cardiac pacemaker: Secondary | ICD-10-CM

## 2019-11-06 DIAGNOSIS — I442 Atrioventricular block, complete: Secondary | ICD-10-CM

## 2019-11-06 NOTE — Patient Instructions (Signed)
Medication Instructions:  No changes *If you need a refill on your cardiac medications before your next appointment, please call your pharmacy*  Lab Work: None ordered If you have labs (blood work) drawn today and your tests are completely normal, you will receive your results only by: [name] MyChart Message (if you have MyChart) OR . A paper copy in the mail If you have any lab test that is abnormal or we need to change your treatment, we will call you to review the results.  Testing/Procedures: Your physician has requested that you have an Aorta/Iliac Duplex in 6 months. This will be take place at [place], [address].    No food after 11PM the night before.  Water is OK. (Don't drink liquids if you have been instructed not to for ANOTHER test).  Take two Extra-Strength Gas-X capsules at bedtime the night before test.   Take an additional two Extra-Strength Gas-X capsules three (3) hours before the test or first thing in the morning.    Avoid foods that produce bowel gas, for 24 hours prior to exam (see below).    No breakfast, no chewing gum, no smoking or carbonated beverages.  Patient may take morning medications with water.  Come in for test at least 15 minutes early to register.   Follow-Up: At [facility], you and your health needs are our priority.  As part of our continuing mission to provide you with exceptional heart care, we have created designated Provider Care Teams.  These Care Teams include your primary Cardiologist (physician) and Advanced Practice Providers (APPs -  Physician Assistants and Nurse Practitioners) who all work together to provide you with the care you need, when you need it.  Your next appointment:   6 month(s)  The format for your next appointment:   In Person  Provider:   Sanda Klein, MD

## 2019-11-06 NOTE — Telephone Encounter (Signed)
-----   Message from Ricci Barker, RN sent at 11/06/2019  9:49 AM EST ----- Regarding: 3 month download Hello.  He needs to be set up on a 3 month download schedule please.  Thank you, Lattie Haw

## 2019-11-06 NOTE — Progress Notes (Signed)
Patient ID: William [name], male   DOB: 08/02/1924, 85 y.o.   MRN: [id number]    Cardiology Office Note    Date:  11/08/2019   ID:  William [name], DOB 01-30-1924, MRN [id number]  PCP:  Deland Pretty, MD  Cardiologist:   Sanda Klein, MD   No chief complaint on file.   History of Present Illness:  William [name] is a 85 y.o. male with sinus node arrest and complete heart block here for follow-up on his dual-chamber permanent pacemaker (Medtronic Revo implanted June 2011), as well as for follow-up of hypertension, hyperlipidemia and abdominal aortic aneurysm status post surgical repair.  His pacemaker demonstrates a single episode of atrial flutter that lasted for about 2.5 hours in September.  The only previous time he has had a similar rhythm problem was in 2017 (an episode that lasted for 2 minutes).  The patient specifically denies any chest pain at rest exertion, dyspnea at rest or with exertion, orthopnea, paroxysmal nocturnal dyspnea, syncope, palpitations, focal neurological deficits, intermittent claudication, lower extremity edema, unexplained weight gain, cough, hemoptysis or wheezing.  Complaints are generally related to arthritis.  Pacemaker interrogation shows normal device function.  He  has >99% ventricular pacing and 86% atrial pacing with an appropriate heart rate histogram distribution. Lead parameters are unchanged and within normal range.  Underlying atrial rhythm is sinus bradycardia in the low 50s, but he has complete heart block without any escape rhythms, he is pacemaker dependent.  His current generator which was changed out in August 2019 has about 10 years of estimated longevity.  He has occasional episodes of nonsustained VT that lasted for only 1 or 2 seconds and are consistently asymptomatic.  Abdominal aorta ultrasound performed June 2019 showed a 2.8 cm x 2.0 cm infrarenal fusiform AAA (surgical repair with aortobiiliac bypass graft).  He has no abdominal  complaints.  Past Medical History:  Diagnosis Date  . AAA (abdominal aortic aneurysm) ([place])   . Atrioventricular block   . Cardiac pacemaker 05/09/10   medtronic dual chamber  . Dyslipidemia   . Nonsustained ventricular tachycardia ([place])     Past Surgical History:  Procedure Laterality Date  . ABDOMINAL AORTIC ANEURYSM REPAIR  08/02/08   Dr. Donnetta Hutching  . MENISCUS REPAIR     both knees  . PPM GENERATOR CHANGEOUT N/A 08/03/2018   Procedure: PPM GENERATOR CHANGEOUT;  Surgeon: Sanda Klein, MD;  Location: [facility];  Service: Cardiovascular;  Laterality: N/A;  . RETINAL TEAR REPAIR CRYOTHERAPY      Current Medications: Outpatient Medications Prior to Visit  Medication Sig Dispense Refill  . lisinopril-hydrochlorothiazide (PRINZIDE,ZESTORETIC) 20-12.5 MG tablet TAKE 1 TABLET BY MOUTH DAILY 90 tablet 3  . metoprolol succinate (TOPROL XL) 25 MG 24 hr tablet Take 1 tablet (25 mg total) by mouth daily. 90 tablet 3  . Multiple Vitamin (MULTIVITAMIN) tablet Take 1 tablet by mouth daily.     No facility-administered medications prior to visit.     Allergies:   Statins   Social History   Socioeconomic History  . Marital status: Married    Spouse name: Not on file  . Number of children: Not on file  . Years of education: Not on file  . Highest education level: Not on file  Occupational History  . Not on file  Tobacco Use  . Smoking status: Former Smoker    Quit date: 11/22/1974    Years since quitting: 44.9  . Smokeless tobacco: Never Used  Substance  and Sexual Activity  . Alcohol use: Yes    Alcohol/week: 7.0 standard drinks    Types: 7 Standard drinks or equivalent per week  . Drug use: No  . Sexual activity: Not on file  Other Topics Concern  . Not on file  Social History Narrative  . Not on file   Social Determinants of Health   Financial Resource Strain:   . Difficulty of Paying Living Expenses: Not on file  Food Insecurity:   . Worried About Ship broker in the Last Year: Not on file  . Ran Out of Food in the Last Year: Not on file  Transportation Needs:   . Lack of Transportation (Medical): Not on file  . Lack of Transportation (Non-Medical): Not on file  Physical Activity:   . Days of Exercise per Week: Not on file  . Minutes of Exercise per Session: Not on file  Stress:   . Feeling of Stress : Not on file  Social Connections:   . Frequency of Communication with Friends and Family: Not on file  . Frequency of Social Gatherings with Friends and Family: Not on file  . Attends Religious Services: Not on file  . Active Member of Clubs or Organizations: Not on file  . Attends Archivist Meetings: Not on file  . Marital Status: Not on file     ROS:   Please see the history of present illness.    ROS all other systems are reviewed and are negative   PHYSICAL EXAM:   VS:  BP 116/64   Pulse 87   Ht 5\' 8"  (1.727 m)   Wt 133 lb (60.3 kg)   BMI 20.22 kg/m      General: Alert, oriented x3, no distress, well-healed left subclavian pacemaker site Head: no evidence of trauma, PERRL, EOMI, no exophtalmos or lid lag, no myxedema, no xanthelasma; normal ears, nose and oropharynx Neck: normal jugular venous pulsations and no hepatojugular reflux; brisk carotid pulses without delay and no carotid bruits Chest: clear to auscultation, no signs of consolidation by percussion or palpation, normal fremitus, symmetrical and full respiratory excursions Cardiovascular: normal position and quality of the apical impulse, regular rhythm, normal first and paradoxically split second heart sounds, no murmurs, rubs or gallops Abdomen: no tenderness or distention, no masses by palpation, no abnormal pulsatility or arterial bruits, normal bowel sounds, no hepatosplenomegaly Extremities: no clubbing, cyanosis or edema; 2+ radial, ulnar and brachial pulses bilaterally; 2+ right femoral, posterior tibial and dorsalis pedis pulses; 2+ left femoral,  posterior tibial and dorsalis pedis pulses; no subclavian or femoral bruits Neurological: grossly nonfocal Psych: Normal mood and affect    Wt Readings from Last 3 Encounters:  11/06/19 133 lb (60.3 kg)  11/02/18 140 lb (63.5 kg)  08/03/18 140 lb (63.5 kg)      Studies/Labs Reviewed:   EKG:  EKG is  ordered today.  Shows AV sequential pacing with occasional PVCs, QTC 495 ms (paced QRS)  Recent Labs: No results found for requested labs within last 8760 hours.  09/15/2018 HDL 43, triglycerides 71 Lipid Panel    Component Value Date/Time   CHOL 182 05/29/2013 0810   TRIG 103 05/29/2013 0810   HDL 39 (L) 05/29/2013 0810   CHOLHDL 4.7 05/29/2013 0810   VLDL 21 05/29/2013 0810   LDLCALC 122 (H) 05/29/2013 0810     ASSESSMENT:    1. Atrial flutter, unspecified type ([place])   2. Complete heart block ([place])  3. SSS (sick sinus syndrome) ([place])   4. Pacemaker   5. NSVT (nonsustained ventricular tachycardia) ([facility])   6. Essential hypertension   7. PAD (peripheral artery disease) ([facility])      PLAN:  In order of problems listed above:  1. AFlutter: Asymptomatic, which is not surprising since he has complete heart block.  The episode was quite lengthy and he does have embolic risk factors.  CHA2DS2-VASc score 4 (age, hypertension, history of abdominal aortic aneurysm).  We discussed the risks of thromboembolic events and the benefits versus risks of anticoagulation.  His age places him at both higher embolic risk and increased bleeding risk.  For the time being he will continue taking aspirin.  If he has even very brief focal neurological events to suggest TIA or CVA he should call promptly and we will discuss starting anticoagulation.  We will also continue monitoring for the overall burden of arrhythmia through his pacemaker and if there is clear evidence that the arrhythmia has frequent recurrence, again we should probably start anticoagulation. 2. CHB: Pacemaker dependent. 3. SSS: He  does have underlying sinus rhythm, appropriate heart rate histogram distribution 4. PPM: Normal device function.  Remote downloads every 3 months and yearly office visits 5. NSVT: This is infrequent, occurs monthly or even less frequently.  It is asymptomatic.  Previous attempts at titration of beta-blockers to higher doses was poorly tolerated. 6. HTN: Well-controlled. 7. AAA s/p Aobifem: Asymptomatic small infrarenal fusiform aneurysm at the level of the anastomosis of his previous aortobifemoral bypass.  He is due for repeat imaging, but in view of the coronavirus pandemic and at his request, we will delay this for about 6 months.   Medication Adjustments/Labs and Tests Ordered: Current medicines are reviewed at length with the patient today.  Concerns regarding medicines are outlined above.  Medication changes, Labs and Tests ordered today are listed in the Patient Instructions below. Patient Instructions  Medication Instructions:  No changes *If you need a refill on your cardiac medications before your next appointment, please call your pharmacy*  Lab Work: None ordered If you have labs (blood work) drawn today and your tests are completely normal, you will receive your results only by: [name] MyChart Message (if you have MyChart) OR . A paper copy in the mail If you have any lab test that is abnormal or we need to change your treatment, we will call you to review the results.  Testing/Procedures: Your physician has requested that you have an Aorta/Iliac Duplex in 6 months. This will be take place at [facility], [address].    No food after 11PM the night before.  Water is OK. (Don't drink liquids if you have been instructed not to for ANOTHER test).  Take two Extra-Strength Gas-X capsules at bedtime the night before test.   Take an additional two Extra-Strength Gas-X capsules three (3) hours before the test or first thing in the morning.    Avoid foods that produce bowel gas,  for 24 hours prior to exam (see below).    No breakfast, no chewing gum, no smoking or carbonated beverages.  Patient may take morning medications with water.  Come in for test at least 15 minutes early to register.   Follow-Up: At [facility], you and your health needs are our priority.  As part of our continuing mission to provide you with exceptional heart care, we have created designated Provider Care Teams.  These Care Teams include your primary Cardiologist (physician) and  Advanced Practice Providers (APPs -  Physician Assistants and Nurse Practitioners) who all work together to provide you with the care you need, when you need it.  Your next appointment:   6 month(s)  The format for your next appointment:   In Person  Provider:   Sanda Klein, MD     Signed, Sanda Klein, MD  11/08/2019 8:40 AM    [facility] [address] [place], [place], [place]  [address] Phone: [phone]; Fax: [phone]

## 2019-11-06 NOTE — Telephone Encounter (Signed)
I let the pt know that Dr. Loletha Grayer wants him to start sending transmissions every 3 months. The pt did not have a home remote monitor to do a transmission with. I ordered the pt a monitor from [facility] and he should receive it within 7-10 business days.

## 2019-11-08 ENCOUNTER — Encounter: Payer: Self-pay | Admitting: Cardiovascular Disease

## 2019-11-14 NOTE — Telephone Encounter (Signed)
Monitor shipped 11-07-2019 per [facility].

## 2019-11-18 ENCOUNTER — Other Ambulatory Visit: Payer: Self-pay | Admitting: Cardiology

## 2019-11-20 ENCOUNTER — Other Ambulatory Visit: Payer: Self-pay | Admitting: Cardiovascular Disease

## 2019-11-21 NOTE — Telephone Encounter (Signed)
Monitor delivered 12/17  Chanetta Marshall, NP 11/21/2019 9:51 AM

## 2019-11-22 ENCOUNTER — Other Ambulatory Visit: Payer: Self-pay | Admitting: Cardiovascular Disease

## 2019-12-27 DIAGNOSIS — H353132 Nonexudative age-related macular degeneration, bilateral, intermediate dry stage: Secondary | ICD-10-CM | POA: Diagnosis not present

## 2019-12-27 DIAGNOSIS — H524 Presbyopia: Secondary | ICD-10-CM | POA: Diagnosis not present

## 2019-12-27 DIAGNOSIS — H02102 Unspecified ectropion of right lower eyelid: Secondary | ICD-10-CM | POA: Diagnosis not present

## 2019-12-27 DIAGNOSIS — H02105 Unspecified ectropion of left lower eyelid: Secondary | ICD-10-CM | POA: Diagnosis not present

## 2019-12-31 ENCOUNTER — Ambulatory Visit: Payer: Medicare Other

## 2020-01-18 ENCOUNTER — Ambulatory Visit: Payer: Medicare Other

## 2020-02-07 ENCOUNTER — Telehealth: Payer: Self-pay

## 2020-02-07 NOTE — Telephone Encounter (Signed)
Spoke with patient to remind of missed remote transmission 

## 2020-02-13 ENCOUNTER — Ambulatory Visit (INDEPENDENT_AMBULATORY_CARE_PROVIDER_SITE_OTHER): Payer: Medicare Other | Admitting: *Deleted

## 2020-02-13 DIAGNOSIS — I442 Atrioventricular block, complete: Secondary | ICD-10-CM | POA: Diagnosis not present

## 2020-02-13 LAB — CUP PACEART REMOTE DEVICE CHECK
Battery Remaining Longevity: 111 mo
Battery Voltage: 3 V
Brady Statistic AP VP Percent: 93 %
Brady Statistic AP VS Percent: 1.34 %
Brady Statistic AS VP Percent: 4.08 %
Brady Statistic AS VS Percent: 1.58 %
Brady Statistic RA Percent Paced: 94.79 %
Brady Statistic RV Percent Paced: 97.09 %
Date Time Interrogation Session: 20210323133035
Implantable Lead Implant Date: 20110617
Implantable Lead Implant Date: 20110617
Implantable Lead Location: 753859
Implantable Lead Location: 753860
Implantable Pulse Generator Implant Date: 20190911
Lead Channel Impedance Value: 342 Ohm
Lead Channel Impedance Value: 380 Ohm
Lead Channel Impedance Value: 418 Ohm
Lead Channel Impedance Value: 475 Ohm
Lead Channel Pacing Threshold Amplitude: 0.75 V
Lead Channel Pacing Threshold Amplitude: 1 V
Lead Channel Pacing Threshold Pulse Width: 0.4 ms
Lead Channel Pacing Threshold Pulse Width: 0.4 ms
Lead Channel Sensing Intrinsic Amplitude: 0.75 mV
Lead Channel Sensing Intrinsic Amplitude: 0.75 mV
Lead Channel Sensing Intrinsic Amplitude: 14.5 mV
Lead Channel Sensing Intrinsic Amplitude: 14.5 mV
Lead Channel Setting Pacing Amplitude: 1.5 V
Lead Channel Setting Pacing Amplitude: 2.5 V
Lead Channel Setting Pacing Pulse Width: 0.4 ms
Lead Channel Setting Sensing Sensitivity: 4 mV

## 2020-02-14 NOTE — Progress Notes (Signed)
PPM Remote  

## 2020-03-06 DIAGNOSIS — M47816 Spondylosis without myelopathy or radiculopathy, lumbar region: Secondary | ICD-10-CM | POA: Diagnosis not present

## 2020-03-06 DIAGNOSIS — M170 Bilateral primary osteoarthritis of knee: Secondary | ICD-10-CM | POA: Diagnosis not present

## 2020-03-06 DIAGNOSIS — G894 Chronic pain syndrome: Secondary | ICD-10-CM | POA: Diagnosis not present

## 2020-03-20 DIAGNOSIS — M170 Bilateral primary osteoarthritis of knee: Secondary | ICD-10-CM | POA: Diagnosis not present

## 2020-04-08 DIAGNOSIS — G894 Chronic pain syndrome: Secondary | ICD-10-CM | POA: Diagnosis not present

## 2020-04-08 DIAGNOSIS — Z888 Allergy status to other drugs, medicaments and biological substances status: Secondary | ICD-10-CM | POA: Diagnosis not present

## 2020-04-08 DIAGNOSIS — M170 Bilateral primary osteoarthritis of knee: Secondary | ICD-10-CM | POA: Diagnosis not present

## 2020-04-08 DIAGNOSIS — Z79899 Other long term (current) drug therapy: Secondary | ICD-10-CM | POA: Diagnosis not present

## 2020-04-08 DIAGNOSIS — M47816 Spondylosis without myelopathy or radiculopathy, lumbar region: Secondary | ICD-10-CM | POA: Diagnosis not present

## 2020-04-29 DIAGNOSIS — D485 Neoplasm of uncertain behavior of skin: Secondary | ICD-10-CM | POA: Diagnosis not present

## 2020-04-29 DIAGNOSIS — D0421 Carcinoma in situ of skin of right ear and external auricular canal: Secondary | ICD-10-CM | POA: Diagnosis not present

## 2020-04-29 DIAGNOSIS — H61002 Unspecified perichondritis of left external ear: Secondary | ICD-10-CM | POA: Diagnosis not present

## 2020-04-29 DIAGNOSIS — Z85828 Personal history of other malignant neoplasm of skin: Secondary | ICD-10-CM | POA: Diagnosis not present

## 2020-04-29 DIAGNOSIS — L570 Actinic keratosis: Secondary | ICD-10-CM | POA: Diagnosis not present

## 2020-05-07 DIAGNOSIS — M47816 Spondylosis without myelopathy or radiculopathy, lumbar region: Secondary | ICD-10-CM | POA: Diagnosis not present

## 2020-05-07 DIAGNOSIS — M170 Bilateral primary osteoarthritis of knee: Secondary | ICD-10-CM | POA: Diagnosis not present

## 2020-05-07 DIAGNOSIS — G894 Chronic pain syndrome: Secondary | ICD-10-CM | POA: Diagnosis not present

## 2020-05-07 DIAGNOSIS — Z79891 Long term (current) use of opiate analgesic: Secondary | ICD-10-CM | POA: Diagnosis not present

## 2020-05-07 DIAGNOSIS — Z5181 Encounter for therapeutic drug level monitoring: Secondary | ICD-10-CM | POA: Diagnosis not present

## 2020-05-10 ENCOUNTER — Ambulatory Visit (INDEPENDENT_AMBULATORY_CARE_PROVIDER_SITE_OTHER): Payer: Medicare Other | Admitting: Cardiovascular Disease

## 2020-05-10 ENCOUNTER — Other Ambulatory Visit: Payer: Self-pay

## 2020-05-10 ENCOUNTER — Encounter: Payer: Self-pay | Admitting: Cardiovascular Disease

## 2020-05-10 ENCOUNTER — Telehealth: Payer: Self-pay | Admitting: *Deleted

## 2020-05-10 VITALS — BP 131/75 | HR 86 | Ht 69.0 in | Wt 138.0 lb

## 2020-05-10 DIAGNOSIS — I442 Atrioventricular block, complete: Secondary | ICD-10-CM

## 2020-05-10 DIAGNOSIS — I10 Essential (primary) hypertension: Secondary | ICD-10-CM | POA: Diagnosis not present

## 2020-05-10 DIAGNOSIS — I4892 Unspecified atrial flutter: Secondary | ICD-10-CM | POA: Diagnosis not present

## 2020-05-10 DIAGNOSIS — I714 Abdominal aortic aneurysm, without rupture, unspecified: Secondary | ICD-10-CM

## 2020-05-10 DIAGNOSIS — I495 Sick sinus syndrome: Secondary | ICD-10-CM

## 2020-05-10 DIAGNOSIS — I472 Ventricular tachycardia: Secondary | ICD-10-CM | POA: Diagnosis not present

## 2020-05-10 DIAGNOSIS — Z950 Presence of cardiac pacemaker: Secondary | ICD-10-CM | POA: Diagnosis not present

## 2020-05-10 DIAGNOSIS — E7800 Pure hypercholesterolemia, unspecified: Secondary | ICD-10-CM

## 2020-05-10 DIAGNOSIS — I4729 Other ventricular tachycardia: Secondary | ICD-10-CM

## 2020-05-10 NOTE — Patient Instructions (Signed)
Medication Instructions:  No changes *If you need a refill on your cardiac medications before your next appointment, please call your pharmacy*   Lab Work: None ordered If you have labs (blood work) drawn today and your tests are completely normal, you will receive your results only by: [name] MyChart Message (if you have MyChart) OR . A paper copy in the mail If you have any lab test that is abnormal or we need to change your treatment, we will call you to review the results.   Testing/Procedures: None ordered   Follow-Up: At [facility], you and your health needs are our priority.  As part of our continuing mission to provide you with exceptional heart care, we have created designated Provider Care Teams.  These Care Teams include your primary Cardiologist (physician) and Advanced Practice Providers (APPs -  Physician Assistants and Nurse Practitioners) who all work together to provide you with the care you need, when you need it.  We recommend signing up for the patient portal called "MyChart".  Sign up information is provided on this After Visit Summary.  MyChart is used to connect with patients for Virtual Visits (Telemedicine).  Patients are able to view lab/test results, encounter notes, upcoming appointments, etc.  Non-urgent messages can be sent to your provider as well.   To learn more about what you can do with MyChart, go to [email or web address].    Your next appointment:   12 month(s) on a pacer day  The format for your next appointment:   In Person  Provider:   Sanda Klein, MD

## 2020-05-10 NOTE — Progress Notes (Signed)
Virtual Visit via Telephone Note   This visit type was conducted due to national recommendations for restrictions regarding the COVID-19 Pandemic (e.g. social distancing) in an effort to limit this patient's exposure and mitigate transmission in our community.  Due to his co-morbid illnesses, this patient is at least at moderate risk for complications without adequate follow up.  This format is felt to be most appropriate for this patient at this time.  The patient did not have access to video technology/had technical difficulties with video requiring transitioning to audio format only (telephone).  All issues noted in this document were discussed and addressed.  No physical exam could be performed with this format.  Please refer to the patient's chart for his  consent to telehealth for [facility].   The patient was identified using 2 identifiers.  Date:  05/10/2020   ID:  William [name], DOB 08-Nov-1924, MRN [id number]  Patient Location: Home Provider Location: Home  PCP:  Deland Pretty, MD  Cardiologist:  Sanda Klein, MD  Electrophysiologist:  None   Evaluation Performed:  Follow-Up Visit  Chief Complaint: Complete heart block, pacemaker  History of Present Illness:    William [name] is a 86 y.o. male with sinus node dysfunction and complete heart block, dual-chamber permanent pacemaker (Medtronic 2011, generator change 2019), history of surgically repaired AAA, hypertension and hyperlipidemia.  He has had a single significant episode of atrial flutter, lasting about 2 hours, that occurred in September 2020 and has not recurred since.  He is not on anticoagulation.  From a cardiovascular point of view he has had a very good year without any complaints.  Physical activity is becoming a little harder due to severe bilateral knee pain.  He has tried cortisone and hyaluronic acid injections without relief.  He is reluctant to undergo knee surgery at age 89.  NSAIDs and tramadol have  not offered relief.  He is considering using kratom, since his pain management specialist has told him that the only other option at this point is hydrocodone.  Pacemaker interrogation is unremarkable. Battery longevity is estimated at about 9 years and lead parameters are good. He has not had any new episodes of atrial flutter or other significant mode switch. He has 95% atrial pacing and 97% ventricular pacing and is pacemaker dependent. Activity level is remarkably constant at about 4 hours a day, despite his orthopedic problems.  The patient does not have symptoms concerning for COVID-19 infection (fever, chills, cough, or new shortness of breath).    Past Medical History:  Diagnosis Date  . AAA (abdominal aortic aneurysm) ([place])   . Atrioventricular block   . Cardiac pacemaker 05/09/10   medtronic dual chamber  . Dyslipidemia   . Nonsustained ventricular tachycardia ([place])    Past Surgical History:  Procedure Laterality Date  . ABDOMINAL AORTIC ANEURYSM REPAIR  08/02/08   Dr. Donnetta Hutching  . MENISCUS REPAIR     both knees  . PPM GENERATOR CHANGEOUT N/A 08/03/2018   Procedure: PPM GENERATOR CHANGEOUT;  Surgeon: Sanda Klein, MD;  Location: [facility];  Service: Cardiovascular;  Laterality: N/A;  . RETINAL TEAR REPAIR CRYOTHERAPY       Current Meds  Medication Sig  . lisinopril-hydrochlorothiazide (ZESTORETIC) 20-12.5 MG tablet TAKE 1 TABLET BY MOUTH EVERY DAY  . metoprolol succinate (TOPROL-XL) 25 MG 24 hr tablet TAKE 1 TABLET BY MOUTH EVERY DAY  . Multiple Vitamin (MULTIVITAMIN) tablet Take 1 tablet by mouth daily.     Allergies:  Statins   Social History   Tobacco Use  . Smoking status: Former Smoker    Quit date: 11/22/1974    Years since quitting: 45.4  . Smokeless tobacco: Never Used  Vaping Use  . Vaping Use: Never used  Substance Use Topics  . Alcohol use: Yes    Alcohol/week: 7.0 standard drinks    Types: 7 Standard drinks or equivalent per week  . Drug  use: No     Family Hx: The patient's family history is not on file.  ROS:   Please see the history of present illness.    All other systems reviewed and are negative.   Prior CV studies:   The following studies were reviewed today: Most recent pacemaker download February 13, 2020  Labs/Other Tests and Data Reviewed:    EKG:  Most recent intracardiac electrogram shows atrial paced, ventricular paced rhythm  Recent Labs: No results found for requested labs within last 8760 hours.   Recent Lipid Panel Lab Results  Component Value Date/Time   CHOL 182 05/29/2013 08:10 AM   TRIG 103 05/29/2013 08:10 AM   HDL 39 (L) 05/29/2013 08:10 AM   CHOLHDL 4.7 05/29/2013 08:10 AM   LDLCALC 122 (H) 05/29/2013 08:10 AM    Wt Readings from Last 3 Encounters:  05/10/20 138 lb (62.6 kg)  11/06/19 133 lb (60.3 kg)  11/02/18 140 lb (63.5 kg)     Objective:    Vital Signs:  BP 131/75   Pulse 86   Ht 5\' 9"  (1.753 m)   Wt 138 lb (62.6 kg)   BMI 20.38 kg/m    VITAL SIGNS:  reviewed Unable to examine  ASSESSMENT & PLAN:    1. CHB: Pacemaker dependent. 2. SSS: Has sinus node arrest. Heart rate histogram distribution appears appropriate. Does not have symptoms of chronotropic incompetence. 3. Atrial flutter: Only 1 episode lasted for about 2 hours. No recurrence in the last 9 months. Not started on anticoagulation. 4. NSVT: None seen recently, although he typically has a monthly episode. Treatment with high-dose beta-blockers was not tolerated. 5. Pacemaker: Normal device function. Due for another remote download next week. 6. AAA s/p repair: Follow-up ultrasound is scheduled in 2 weeks. 7. HTN: Excellent control 8. HLP: Labs were checked recently by Dr. Shelia Media and Mrs. Nan was told that all the lipid parameters were in desirable range. Will request a copy.   COVID-19 Education: The signs and symptoms of COVID-19 were discussed with the patient and how to seek care for testing (follow  up with PCP or arrange E-visit).  The importance of social distancing was discussed today.  Time:   Today, I have spent 21 minutes with the patient with telehealth technology discussing the above problems.     Medication Adjustments/Labs and Tests Ordered: Current medicines are reviewed at length with the patient today.  Concerns regarding medicines are outlined above.   Tests Ordered: No orders of the defined types were placed in this encounter.   Medication Changes: No orders of the defined types were placed in this encounter.   Follow Up:  In Person 1 year  Signed, Sanda Klein, MD  05/10/2020 10:07 AM    [facility]

## 2020-05-10 NOTE — Telephone Encounter (Signed)
  Patient Consent for Virtual Visit         William [name] has provided verbal consent on 05/10/2020 for a virtual visit (video or telephone).   CONSENT FOR VIRTUAL VISIT FOR:  William [name]  By participating in this virtual visit I agree to the following:  I hereby voluntarily request, consent and authorize [facility] and its employed or contracted physicians, physician assistants, nurse practitioners or other licensed health care professionals (the Practitioner), to provide me with telemedicine health care services (the "Services") as deemed necessary by the treating Practitioner. I acknowledge and consent to receive the Services by the Practitioner via telemedicine. I understand that the telemedicine visit will involve communicating with the Practitioner through live audiovisual communication technology and the disclosure of certain medical information by electronic transmission. I acknowledge that I have been given the opportunity to request an in-person assessment or other available alternative prior to the telemedicine visit and am voluntarily participating in the telemedicine visit.  I understand that I have the right to withhold or withdraw my consent to the use of telemedicine in the course of my care at any time, without affecting my right to future care or treatment, and that the Practitioner or I may terminate the telemedicine visit at any time. I understand that I have the right to inspect all information obtained and/or recorded in the course of the telemedicine visit and may receive copies of available information for a reasonable fee.  I understand that some of the potential risks of receiving the Services via telemedicine include:  [name] Delay or interruption in medical evaluation due to technological equipment failure or disruption; . Information transmitted may not be sufficient (e.g. poor resolution of images) to allow for appropriate medical decision making by the Practitioner;  and/or  . In rare instances, security protocols could fail, causing a breach of personal health information.  Furthermore, I acknowledge that it is my responsibility to provide information about my medical history, conditions and care that is complete and accurate to the best of my ability. I acknowledge that Practitioner's advice, recommendations, and/or decision may be based on factors not within their control, such as incomplete or inaccurate data provided by me or distortions of diagnostic images or specimens that may result from electronic transmissions. I understand that the practice of medicine is not an exact science and that Practitioner makes no warranties or guarantees regarding treatment outcomes. I acknowledge that a copy of this consent can be made available to me via my patient portal ([facility]), or I can request a printed copy by calling the office of [facility].    I understand that my insurance will be billed for this visit.   I have read or had this consent read to me. . I understand the contents of this consent, which adequately explains the benefits and risks of the Services being provided via telemedicine.  . I have been provided ample opportunity to ask questions regarding this consent and the Services and have had my questions answered to my satisfaction. . I give my informed consent for the services to be provided through the use of telemedicine in my medical care

## 2020-05-14 ENCOUNTER — Ambulatory Visit (INDEPENDENT_AMBULATORY_CARE_PROVIDER_SITE_OTHER): Payer: Medicare Other | Admitting: *Deleted

## 2020-05-14 DIAGNOSIS — I442 Atrioventricular block, complete: Secondary | ICD-10-CM

## 2020-05-14 LAB — CUP PACEART REMOTE DEVICE CHECK
Battery Remaining Longevity: 107 mo
Battery Voltage: 3 V
Brady Statistic AP VP Percent: 92.44 %
Brady Statistic AP VS Percent: 0.13 %
Brady Statistic AS VP Percent: 6.59 %
Brady Statistic AS VS Percent: 0.84 %
Brady Statistic RA Percent Paced: 92.95 %
Brady Statistic RV Percent Paced: 99.02 %
Date Time Interrogation Session: 20210621203938
Implantable Lead Implant Date: 20110617
Implantable Lead Implant Date: 20110617
Implantable Lead Location: 753859
Implantable Lead Location: 753860
Implantable Pulse Generator Implant Date: 20190911
Lead Channel Impedance Value: 361 Ohm
Lead Channel Impedance Value: 380 Ohm
Lead Channel Impedance Value: 399 Ohm
Lead Channel Impedance Value: 456 Ohm
Lead Channel Pacing Threshold Amplitude: 0.75 V
Lead Channel Pacing Threshold Amplitude: 1 V
Lead Channel Pacing Threshold Pulse Width: 0.4 ms
Lead Channel Pacing Threshold Pulse Width: 0.4 ms
Lead Channel Sensing Intrinsic Amplitude: 0.875 mV
Lead Channel Sensing Intrinsic Amplitude: 0.875 mV
Lead Channel Sensing Intrinsic Amplitude: 14.5 mV
Lead Channel Sensing Intrinsic Amplitude: 14.5 mV
Lead Channel Setting Pacing Amplitude: 1.5 V
Lead Channel Setting Pacing Amplitude: 2.5 V
Lead Channel Setting Pacing Pulse Width: 0.4 ms
Lead Channel Setting Sensing Sensitivity: 4 mV

## 2020-05-15 NOTE — Progress Notes (Signed)
Remote pacemaker transmission.   

## 2020-05-29 ENCOUNTER — Encounter (HOSPITAL_COMMUNITY): Payer: Medicare Other

## 2020-05-29 DIAGNOSIS — H6192 Disorder of left external ear, unspecified: Secondary | ICD-10-CM | POA: Diagnosis not present

## 2020-06-10 ENCOUNTER — Inpatient Hospital Stay (HOSPITAL_COMMUNITY): Admission: RE | Admit: 2020-06-10 | Payer: Medicare Other | Source: Ambulatory Visit

## 2020-06-13 ENCOUNTER — Telehealth: Payer: Self-pay | Admitting: Cardiovascular Disease

## 2020-06-13 NOTE — Telephone Encounter (Signed)
Will forward to Dr Sallyanne Kuster for review./cy

## 2020-06-13 NOTE — Telephone Encounter (Signed)
Pt c/o medication issue:  1. Name of Medication: Belbuca 75 MCG Symproic .2 MG  2. How are you currently taking this medication (dosage and times per day)? Not currently taking medication   3. Are you having a reaction (difficulty breathing--STAT)? No   4. What is your medication issue? William [name] is calling stating one of his Doctors is wanting to start him on both of these medications. He states the Belbuca is a film you place inside your cheek once a day that treats pain and the Symproic is for constipation and he is to take 1 tablet daily. [name] just wanted to make Dr. Sallyanne Kuster aware he will begin taking these medications either today or tomorrow. Please advise.

## 2020-06-13 NOTE — Telephone Encounter (Signed)
Pt notified of Dr Croitoru's answer .Adonis Housekeeper

## 2020-06-13 NOTE — Telephone Encounter (Signed)
No specific cardiac issues with those meds in his case. OK to take.

## 2020-06-25 DIAGNOSIS — Z5181 Encounter for therapeutic drug level monitoring: Secondary | ICD-10-CM | POA: Diagnosis not present

## 2020-06-25 DIAGNOSIS — M47816 Spondylosis without myelopathy or radiculopathy, lumbar region: Secondary | ICD-10-CM | POA: Diagnosis not present

## 2020-06-25 DIAGNOSIS — Z79891 Long term (current) use of opiate analgesic: Secondary | ICD-10-CM | POA: Diagnosis not present

## 2020-06-25 DIAGNOSIS — M170 Bilateral primary osteoarthritis of knee: Secondary | ICD-10-CM | POA: Diagnosis not present

## 2020-06-25 DIAGNOSIS — M25561 Pain in right knee: Secondary | ICD-10-CM | POA: Diagnosis not present

## 2020-08-05 DIAGNOSIS — M47816 Spondylosis without myelopathy or radiculopathy, lumbar region: Secondary | ICD-10-CM | POA: Diagnosis not present

## 2020-08-05 DIAGNOSIS — M170 Bilateral primary osteoarthritis of knee: Secondary | ICD-10-CM | POA: Diagnosis not present

## 2020-08-05 DIAGNOSIS — Z5181 Encounter for therapeutic drug level monitoring: Secondary | ICD-10-CM | POA: Diagnosis not present

## 2020-08-05 DIAGNOSIS — Z79891 Long term (current) use of opiate analgesic: Secondary | ICD-10-CM | POA: Diagnosis not present

## 2020-08-05 DIAGNOSIS — M2550 Pain in unspecified joint: Secondary | ICD-10-CM | POA: Diagnosis not present

## 2020-08-13 ENCOUNTER — Ambulatory Visit (INDEPENDENT_AMBULATORY_CARE_PROVIDER_SITE_OTHER): Payer: Medicare Other | Admitting: *Deleted

## 2020-08-13 DIAGNOSIS — I442 Atrioventricular block, complete: Secondary | ICD-10-CM | POA: Diagnosis not present

## 2020-08-13 LAB — CUP PACEART REMOTE DEVICE CHECK
Battery Remaining Longevity: 103 mo
Battery Voltage: 2.99 V
Brady Statistic AP VP Percent: 92.68 %
Brady Statistic AP VS Percent: 0.06 %
Brady Statistic AS VP Percent: 6.77 %
Brady Statistic AS VS Percent: 0.49 %
Brady Statistic RA Percent Paced: 92.89 %
Brady Statistic RV Percent Paced: 99.45 %
Date Time Interrogation Session: 20210920200755
Implantable Lead Implant Date: 20110617
Implantable Lead Implant Date: 20110617
Implantable Lead Location: 753859
Implantable Lead Location: 753860
Implantable Pulse Generator Implant Date: 20190911
Lead Channel Impedance Value: 361 Ohm
Lead Channel Impedance Value: 399 Ohm
Lead Channel Impedance Value: 399 Ohm
Lead Channel Impedance Value: 456 Ohm
Lead Channel Pacing Threshold Amplitude: 0.75 V
Lead Channel Pacing Threshold Amplitude: 0.875 V
Lead Channel Pacing Threshold Pulse Width: 0.4 ms
Lead Channel Pacing Threshold Pulse Width: 0.4 ms
Lead Channel Sensing Intrinsic Amplitude: 0.625 mV
Lead Channel Sensing Intrinsic Amplitude: 0.625 mV
Lead Channel Sensing Intrinsic Amplitude: 14.5 mV
Lead Channel Sensing Intrinsic Amplitude: 14.5 mV
Lead Channel Setting Pacing Amplitude: 1.5 V
Lead Channel Setting Pacing Amplitude: 2.5 V
Lead Channel Setting Pacing Pulse Width: 0.4 ms
Lead Channel Setting Sensing Sensitivity: 4 mV

## 2020-08-14 NOTE — Progress Notes (Signed)
Remote pacemaker transmission.   

## 2020-08-27 ENCOUNTER — Other Ambulatory Visit: Payer: Self-pay | Admitting: Cardiology

## 2020-09-03 DIAGNOSIS — Z5181 Encounter for therapeutic drug level monitoring: Secondary | ICD-10-CM | POA: Diagnosis not present

## 2020-09-03 DIAGNOSIS — Z79891 Long term (current) use of opiate analgesic: Secondary | ICD-10-CM | POA: Diagnosis not present

## 2020-09-03 DIAGNOSIS — M2550 Pain in unspecified joint: Secondary | ICD-10-CM | POA: Diagnosis not present

## 2020-09-03 DIAGNOSIS — M170 Bilateral primary osteoarthritis of knee: Secondary | ICD-10-CM | POA: Diagnosis not present

## 2020-09-03 DIAGNOSIS — M47816 Spondylosis without myelopathy or radiculopathy, lumbar region: Secondary | ICD-10-CM | POA: Diagnosis not present

## 2020-09-14 DIAGNOSIS — H6122 Impacted cerumen, left ear: Secondary | ICD-10-CM | POA: Diagnosis not present

## 2020-10-21 ENCOUNTER — Telehealth: Payer: Self-pay

## 2020-10-21 NOTE — Telephone Encounter (Signed)
Carelink alert for long AT/AF episode >6hours, AF Burden 0.4%. V rates overall controlled.    Pt has history of AFl although noted to be subclinical therefore pt not on [place].    Pt meds include Metoprolol Succinate 25mg  Daily.    Forwarding to MD to determine if any changes necessary.

## 2020-10-22 NOTE — Telephone Encounter (Signed)
I think I need to see him in clinic to discuss pros and cons of anticoagulation face-to-face.  Lattie Haw, could you please bring him in for nonurgent appointment?  Thank you

## 2020-10-22 NOTE — Telephone Encounter (Signed)
Patient has been called. Appointment made for 10/31/20 with Dr. Sallyanne Kuster.

## 2020-10-31 ENCOUNTER — Other Ambulatory Visit: Payer: Self-pay

## 2020-10-31 ENCOUNTER — Encounter: Payer: Self-pay | Admitting: Cardiovascular Disease

## 2020-10-31 ENCOUNTER — Ambulatory Visit (INDEPENDENT_AMBULATORY_CARE_PROVIDER_SITE_OTHER): Payer: Medicare Other | Admitting: Cardiovascular Disease

## 2020-10-31 VITALS — BP 126/70 | HR 64 | Ht 68.0 in | Wt 138.0 lb

## 2020-10-31 DIAGNOSIS — I714 Abdominal aortic aneurysm, without rupture, unspecified: Secondary | ICD-10-CM

## 2020-10-31 DIAGNOSIS — E7800 Pure hypercholesterolemia, unspecified: Secondary | ICD-10-CM | POA: Diagnosis not present

## 2020-10-31 DIAGNOSIS — I480 Paroxysmal atrial fibrillation: Secondary | ICD-10-CM

## 2020-10-31 DIAGNOSIS — I10 Essential (primary) hypertension: Secondary | ICD-10-CM | POA: Diagnosis not present

## 2020-10-31 DIAGNOSIS — I472 Ventricular tachycardia: Secondary | ICD-10-CM

## 2020-10-31 DIAGNOSIS — Z950 Presence of cardiac pacemaker: Secondary | ICD-10-CM

## 2020-10-31 DIAGNOSIS — I495 Sick sinus syndrome: Secondary | ICD-10-CM | POA: Diagnosis not present

## 2020-10-31 DIAGNOSIS — I442 Atrioventricular block, complete: Secondary | ICD-10-CM

## 2020-10-31 DIAGNOSIS — I4729 Other ventricular tachycardia: Secondary | ICD-10-CM

## 2020-10-31 MED ORDER — METOPROLOL SUCCINATE ER 25 MG PO TB24: 25.0000 mg | ORAL_TABLET | Freq: Every day | ORAL | 3 refills | Status: AC

## 2020-10-31 MED ORDER — APIXABAN 5 MG PO TABS: 5.0000 mg | ORAL_TABLET | Freq: Two times a day (BID) | ORAL | 11 refills | Status: DC

## 2020-10-31 NOTE — Patient Instructions (Addendum)
Medication Instructions:  START Eliquis 5 mg twice daily  Samples given. 2 boxes [id number] (Exp 2/24) *If you need a refill on your cardiac medications before your next appointment, please call your pharmacy*   Lab Work: None ordered If you have labs (blood work) drawn today and your tests are completely normal, you will receive your results only by: [name] MyChart Message (if you have MyChart) OR . A paper copy in the mail If you have any lab test that is abnormal or we need to change your treatment, we will call you to review the results.   Testing/Procedures: None ordered   Follow-Up: At [facility], you and your health needs are our priority.  As part of our continuing mission to provide you with exceptional heart care, we have created designated Provider Care Teams.  These Care Teams include your primary Cardiologist (physician) and Advanced Practice Providers (APPs -  Physician Assistants and Nurse Practitioners) who all work together to provide you with the care you need, when you need it.  We recommend signing up for the patient portal called "MyChart".  Sign up information is provided on this After Visit Summary.  MyChart is used to connect with patients for Virtual Visits (Telemedicine).  Patients are able to view lab/test results, encounter notes, upcoming appointments, etc.  Non-urgent messages can be sent to your provider as well.   To learn more about what you can do with MyChart, go to [email or web address].    Your next appointment:   3 month(s) on a device day  The format for your next appointment:   In Person  Provider:   Sanda Klein, MD

## 2020-11-01 ENCOUNTER — Encounter: Payer: Self-pay | Admitting: Cardiovascular Disease

## 2020-11-01 NOTE — Progress Notes (Signed)
Cardiology office note    Date:  11/01/2020   ID:  William [name], DOB June 21, 1924, MRN [id number] PCP:  Deland Pretty, MD  Cardiologist:  Sanda Klein, MD  Electrophysiologist:  None   Evaluation Performed:  Follow-Up Visit  Chief Complaint: Complete heart block, pacemaker Newly diagnosed atrial fibrillation  History of Present Illness:    William [name] is a 86 y.o. male with sinus node dysfunction and complete heart block, dual-chamber permanent pacemaker (Medtronic 2011, generator change 2019), history of surgically repaired AAA, hypertension and hyperlipidemia.  He has had a single significant episode of atrial flutter, lasting about 2 hours, that occurred in September 2020 and had not recurred since.  He is not on anticoagulation.,  His pacemaker recorder lengthy episode of atrial fibrillation on November 28 lasting for total of 12 hours.  There is occasional reported interruption of the arrhythmia due to atrial under sensing, but it largely appears to be a single lengthy event.  He was completely asymptomatic during this episode, he has continued uninterrupted regular ventricular pacing due to complete heart block.  He has not had any recent falls, injuries and denies any bleeding issues.  He occasionally takes NSAIDs for arthritis, but only does this a couple of times a week, he gets more relief from using acetaminophen/hydrocodone rather than the meloxicam.  He does not have a history of stroke TIA or systemic embolism.  Does have well-treated systemic hypertension and history of AAA.  He does not have known coronary/vascular disease and does not have diabetes.  CHA2DS2-VASc 4 (age 62, HTN, PAD).  Pacemaker function is otherwise normal.  Prior to the onset of atrial fibrillation he has roughly 91% atrial pacing and 99.2% ventricular pacing.  Activity level remains excellent at about 5 hours a day.  His device is a Medtronic Azure dual-chamber pacemaker implanted in 2019 and the  leads are a Medtronic 5086 leads.  All the problems are excellent.  Estimated generator longevity is 8.3 years.  The patient specifically denies any chest pain at rest exertion, dyspnea at rest or with exertion, orthopnea, paroxysmal nocturnal dyspnea, syncope, palpitations, focal neurological deficits, intermittent claudication, lower extremity edema, unexplained weight gain, cough, hemoptysis or wheezing.   Past Medical History:  Diagnosis Date  . AAA (abdominal aortic aneurysm) ([place])   . Atrioventricular block   . Cardiac pacemaker 05/09/10   medtronic dual chamber  . Dyslipidemia   . Nonsustained ventricular tachycardia ([place])    Past Surgical History:  Procedure Laterality Date  . ABDOMINAL AORTIC ANEURYSM REPAIR  08/02/08   Dr. Donnetta Hutching  . MENISCUS REPAIR     both knees  . PPM GENERATOR CHANGEOUT N/A 08/03/2018   Procedure: PPM GENERATOR CHANGEOUT;  Surgeon: Sanda Klein, MD;  Location: [place] [facility];  Service: Cardiovascular;  Laterality: N/A;  . RETINAL TEAR REPAIR CRYOTHERAPY       Current Meds  Medication Sig  . HYDROcodone-acetaminophen (NORCO) 7.5-325 MG tablet Take by mouth.  [name] lisinopril-hydrochlorothiazide (ZESTORETIC) 20-12.5 MG tablet TAKE 1 TABLET BY MOUTH EVERY DAY  . Multiple Vitamin (MULTIVITAMIN) tablet Take 1 tablet by mouth daily.  [name] SYMPROIC 0.2 MG TABS Take 1 tablet by mouth daily.  . [DISCONTINUED] meloxicam (MOBIC) 7.5 MG tablet Take 7.5 mg by mouth daily.  . [DISCONTINUED] metoprolol succinate (TOPROL-XL) 25 MG 24 hr tablet TAKE 1 TABLET BY MOUTH EVERY DAY     Allergies:   Statins   Social History   Tobacco Use  . Smoking status: Former  Smoker    Quit date: 11/22/1974    Years since quitting: 45.9  . Smokeless tobacco: Never Used  Vaping Use  . Vaping Use: Never used  Substance Use Topics  . Alcohol use: Yes    Alcohol/week: 7.0 standard drinks    Types: 7 Standard drinks or equivalent per week  . Drug use: No     Family Hx: The  patient's family history is not on file.  ROS:   Please see the history of present illness.    All other systems reviewed and are negative.   Prior CV studies:   The following studies were reviewed today: Most recent pacemaker download February 13, 2020  Labs/Other Tests and Data Reviewed:    EKG: Atrial paced, ventricular paced rhythm.  QTc 472 ms  Recent Labs: No results found for requested labs within last 8760 hours.   Recent Lipid Panel Lab Results  Component Value Date/Time   CHOL 182 05/29/2013 08:10 AM   TRIG 103 05/29/2013 08:10 AM   HDL 39 (L) 05/29/2013 08:10 AM   CHOLHDL 4.7 05/29/2013 08:10 AM   LDLCALC 122 (H) 05/29/2013 08:10 AM    Wt Readings from Last 3 Encounters:  10/31/20 138 lb (62.6 kg)  05/10/20 138 lb (62.6 kg)  11/06/19 133 lb (60.3 kg)     Objective:    Vital Signs:  BP 126/70   Pulse 64   Ht 5\' 8"  (1.727 m)   Wt 138 lb (62.6 kg)   SpO2 96%   BMI 20.98 kg/m     General: Alert, oriented x3, no distress, healthy left subclavian pacemaker site Head: no evidence of trauma, PERRL, EOMI, no exophtalmos or lid lag, no myxedema, no xanthelasma; normal ears, nose and oropharynx Neck: normal jugular venous pulsations and no hepatojugular reflux; brisk carotid pulses without delay and no carotid bruits Chest: clear to auscultation, no signs of consolidation by percussion or palpation, normal fremitus, symmetrical and full respiratory excursions Cardiovascular: normal position and quality of the apical impulse, regular rhythm, normal first and paradoxically split second heart sounds, no murmurs, rubs or gallops Abdomen: no tenderness or distention, no masses by palpation, no abnormal pulsatility or arterial bruits, normal bowel sounds, no hepatosplenomegaly Extremities: no clubbing, cyanosis or edema; 2+ radial, ulnar and brachial pulses bilaterally; 2+ right femoral, posterior tibial and dorsalis pedis pulses; 2+ left femoral, posterior tibial and  dorsalis pedis pulses; no subclavian or femoral bruits Neurological: grossly nonfocal Psych: Normal mood and affect   ASSESSMENT & PLAN:    1. CHB (complete heart block) ([place])   2. SSS (sick sinus syndrome) ([facility])   3. Paroxysmal atrial fibrillation ([place])   4. NSVT (nonsustained ventricular tachycardia) ([place])   5. Pacemaker   6. AAA (abdominal aortic aneurysm) without rupture ([place])   7. Essential hypertension   8. Hypercholesterolemia      1. CHB: Pacemaker dependent. 2. SSS: Has sinus node arrest. Heart rate histogram distribution appears appropriate. Does not have symptoms of chronotropic incompetence. 3. Atrial fibrillation: He had a previous episode of atrial flutter that lasted for 2 hours and 2020.  He now has an episode of atrial fibrillation lasting for 12 hours and in between he has had frequent brief bursts of paroxysmal atrial tachycardia.  Discussed the risk of stroke.  We will start Eliquis 5 mg twice daily.  Asked him to promptly report any bleeding complications.  He will stop taking meloxicam on a regular basis. 4. NSVT: None seen on current download.  We try to treat with beta-blockers and higher doses in the past and this was poorly tolerated. 5. Pacemaker: Normal device function. Due for another remote download next week. 6. AAA s/p repair: Need to schedule for follow-up ultrasound. 7. HTN: Very well controlled. 8. HLP: [place] medical labs will be requested.    Signed, Sanda Klein, MD  11/01/2020 8:26 AM    [facility]

## 2020-11-12 ENCOUNTER — Ambulatory Visit (INDEPENDENT_AMBULATORY_CARE_PROVIDER_SITE_OTHER): Payer: Medicare Other

## 2020-11-12 DIAGNOSIS — I442 Atrioventricular block, complete: Secondary | ICD-10-CM

## 2020-11-12 LAB — CUP PACEART REMOTE DEVICE CHECK
Battery Remaining Longevity: 101 mo
Battery Voltage: 2.99 V
Brady Statistic AP VP Percent: 89.68 %
Brady Statistic AP VS Percent: 0.16 %
Brady Statistic AS VP Percent: 9.42 %
Brady Statistic AS VS Percent: 0.74 %
Brady Statistic RA Percent Paced: 89.63 %
Brady Statistic RV Percent Paced: 99.09 %
Date Time Interrogation Session: 20211221020650
Implantable Lead Implant Date: 20110617
Implantable Lead Implant Date: 20110617
Implantable Lead Location: 753859
Implantable Lead Location: 753860
Implantable Pulse Generator Implant Date: 20190911
Lead Channel Impedance Value: 361 Ohm
Lead Channel Impedance Value: 399 Ohm
Lead Channel Impedance Value: 418 Ohm
Lead Channel Impedance Value: 456 Ohm
Lead Channel Pacing Threshold Amplitude: 0.75 V
Lead Channel Pacing Threshold Amplitude: 1 V
Lead Channel Pacing Threshold Pulse Width: 0.4 ms
Lead Channel Pacing Threshold Pulse Width: 0.4 ms
Lead Channel Sensing Intrinsic Amplitude: 0.875 mV
Lead Channel Sensing Intrinsic Amplitude: 0.875 mV
Lead Channel Sensing Intrinsic Amplitude: 12.5 mV
Lead Channel Sensing Intrinsic Amplitude: 12.5 mV
Lead Channel Setting Pacing Amplitude: 1.5 V
Lead Channel Setting Pacing Amplitude: 2.5 V
Lead Channel Setting Pacing Pulse Width: 0.4 ms
Lead Channel Setting Sensing Sensitivity: 4 mV

## 2020-11-18 ENCOUNTER — Telehealth: Payer: Self-pay | Admitting: Cardiovascular Disease

## 2020-11-18 ENCOUNTER — Telehealth: Payer: Self-pay

## 2020-11-18 NOTE — Telephone Encounter (Signed)
Manual transmission received. Longest event logged 7 hours 13 minutes. Advised patient he has back in SR. Advised we will continue to monitor, please call back with questions or concerns. Patient in agreeance and thankful for return phone call.

## 2020-11-18 NOTE — Telephone Encounter (Signed)
Pt called to see if we received his transmission. Transmission received.

## 2020-11-18 NOTE — Telephone Encounter (Signed)
Please see Epic note

## 2020-11-18 NOTE — Telephone Encounter (Signed)
Patient would like for Dr. Royann Shivers or nurse to give him a call

## 2020-11-18 NOTE — Telephone Encounter (Signed)
Carelink alert received for AF burden > 6 hours. Hx of AF, longest event noted per OV note 12 hours. Called patient to send manual transmission.  Will review after received.

## 2020-11-18 NOTE — Telephone Encounter (Signed)
The pt is calling to follow up on whether our office has received a copy of the manual transmission he provided after learning that he was in afib over the Christmas holiday. He states he is doing well, and did not even realize he was in afib. He is wondering if Dr. Royann Shivers has reviewed his transmission. Informed pt that there is documentation from the [facility] office that the transmission was received. We will reach back out to him with further recommendations once our office is in receipt. Will follow up with device clinic.

## 2020-11-26 NOTE — Progress Notes (Signed)
Remote pacemaker transmission.   

## 2020-12-04 ENCOUNTER — Telehealth: Payer: Self-pay

## 2020-12-04 NOTE — Telephone Encounter (Signed)
**Note De-Identified  Obfuscation** I started a Eliquis PA through covermymeds. Key: [id number]

## 2020-12-05 MED ORDER — APIXABAN 5 MG PO TABS: 5.0000 mg | ORAL_TABLET | Freq: Two times a day (BID) | ORAL | 0 refills | Status: DC

## 2020-12-05 NOTE — Telephone Encounter (Signed)
Patient is following up regarding Eliquis PA. He states he is concerned that he may not be able to wait much longer as he only has 4 tablets remaining. He is requesting to have a prescription sent to his local pharmacy ASAP. Not sure whether or not PA is still needed.   *STAT* If patient is at the pharmacy, call can be transferred to refill team.   1. Which medications need to be refilled? (please list name of each medication and dose if known) apixaban (ELIQUIS) 5 MG TABS tablet  2. Which pharmacy/location (including street and city if local pharmacy) is medication to be sent to? [id number] - [place], [place] [address]  3. Do they need a 30 day or 90 day supply? 90 day supply

## 2020-12-20 ENCOUNTER — Telehealth: Payer: Self-pay | Admitting: Cardiovascular Disease

## 2020-12-20 DIAGNOSIS — Z79899 Other long term (current) drug therapy: Secondary | ICD-10-CM

## 2020-12-20 DIAGNOSIS — I480 Paroxysmal atrial fibrillation: Secondary | ICD-10-CM

## 2020-12-20 NOTE — Telephone Encounter (Signed)
New Message:     Pt wants to know if he needs lab work before he get his Eliquis?[name]

## 2020-12-20 NOTE — Telephone Encounter (Signed)
Patient needs recent CBC and BMET, but looks like he was also waiting for PA approval for him Eliquis.   We should be able to provide 2 weeks sample (if needed) until all paperwork completed.

## 2020-12-23 DIAGNOSIS — Z79899 Other long term (current) drug therapy: Secondary | ICD-10-CM | POA: Diagnosis not present

## 2020-12-23 DIAGNOSIS — I480 Paroxysmal atrial fibrillation: Secondary | ICD-10-CM | POA: Diagnosis not present

## 2020-12-23 NOTE — Addendum Note (Signed)
Addended by: Sherrie Mustache on: 12/23/2020 08:23 AM   Modules accepted: Orders

## 2020-12-23 NOTE — Telephone Encounter (Signed)
Lab orders placed and 2 weeks worth of samples for Eliquis 5mg  left at the front desk. ([id number], EXP 4/23).  Patient aware and verbalized understanding.

## 2020-12-23 NOTE — Telephone Encounter (Signed)
Patient following up.

## 2020-12-24 ENCOUNTER — Encounter: Payer: Self-pay | Admitting: *Deleted

## 2020-12-24 LAB — BASIC METABOLIC PANEL
BUN/Creatinine Ratio: 26 — ABNORMAL HIGH (ref 10–24)
BUN: 28 mg/dL (ref 10–36)
CO2: 26 mmol/L (ref 20–29)
Calcium: 9.5 mg/dL (ref 8.6–10.2)
Chloride: 101 mmol/L (ref 96–106)
Creatinine, Ser: 1.08 mg/dL (ref 0.76–1.27)
GFR calc Af Amer: 68 mL/min/{1.73_m2} (ref >59)
GFR calc non Af Amer: 58 mL/min/{1.73_m2} — ABNORMAL LOW (ref >59)
Glucose: 94 mg/dL (ref 65–99)
Potassium: 5.2 mmol/L (ref 3.5–5.2)
Sodium: 141 mmol/L (ref 134–144)

## 2020-12-24 LAB — CBC
Hematocrit: 41.5 % (ref 37.5–51.0)
Hemoglobin: 13.5 g/dL (ref 13.0–17.7)
MCH: 30.5 pg (ref 26.6–33.0)
MCHC: 32.5 g/dL (ref 31.5–35.7)
MCV: 94 fL (ref 79–97)
Platelets: 202 10*3/uL (ref 150–450)
RBC: 4.42 x10E6/uL (ref 4.14–5.80)
RDW: 12.3 % (ref 11.6–15.4)
WBC: 6.5 10*3/uL (ref 3.4–10.8)

## 2020-12-30 ENCOUNTER — Other Ambulatory Visit: Payer: Self-pay | Admitting: Cardiovascular Disease

## 2020-12-30 DIAGNOSIS — H524 Presbyopia: Secondary | ICD-10-CM | POA: Diagnosis not present

## 2020-12-30 DIAGNOSIS — H353132 Nonexudative age-related macular degeneration, bilateral, intermediate dry stage: Secondary | ICD-10-CM | POA: Diagnosis not present

## 2020-12-30 DIAGNOSIS — H43813 Vitreous degeneration, bilateral: Secondary | ICD-10-CM | POA: Diagnosis not present

## 2020-12-30 DIAGNOSIS — H26493 Other secondary cataract, bilateral: Secondary | ICD-10-CM | POA: Diagnosis not present

## 2021-01-07 DIAGNOSIS — H26492 Other secondary cataract, left eye: Secondary | ICD-10-CM | POA: Diagnosis not present

## 2021-02-11 ENCOUNTER — Ambulatory Visit (INDEPENDENT_AMBULATORY_CARE_PROVIDER_SITE_OTHER): Payer: Medicare Other | Admitting: Cardiovascular Disease

## 2021-02-11 ENCOUNTER — Ambulatory Visit (INDEPENDENT_AMBULATORY_CARE_PROVIDER_SITE_OTHER): Payer: Medicare Other

## 2021-02-11 ENCOUNTER — Encounter: Payer: Self-pay | Admitting: Cardiovascular Disease

## 2021-02-11 ENCOUNTER — Other Ambulatory Visit: Payer: Self-pay

## 2021-02-11 VITALS — BP 122/70 | HR 74 | Ht 68.0 in | Wt 135.0 lb

## 2021-02-11 DIAGNOSIS — I480 Paroxysmal atrial fibrillation: Secondary | ICD-10-CM

## 2021-02-11 DIAGNOSIS — I714 Abdominal aortic aneurysm, without rupture, unspecified: Secondary | ICD-10-CM

## 2021-02-11 DIAGNOSIS — I472 Ventricular tachycardia: Secondary | ICD-10-CM | POA: Diagnosis not present

## 2021-02-11 DIAGNOSIS — Z7901 Long term (current) use of anticoagulants: Secondary | ICD-10-CM | POA: Diagnosis not present

## 2021-02-11 DIAGNOSIS — I495 Sick sinus syndrome: Secondary | ICD-10-CM

## 2021-02-11 DIAGNOSIS — Z950 Presence of cardiac pacemaker: Secondary | ICD-10-CM | POA: Diagnosis not present

## 2021-02-11 DIAGNOSIS — I10 Essential (primary) hypertension: Secondary | ICD-10-CM | POA: Diagnosis not present

## 2021-02-11 DIAGNOSIS — E7800 Pure hypercholesterolemia, unspecified: Secondary | ICD-10-CM

## 2021-02-11 DIAGNOSIS — I4729 Other ventricular tachycardia: Secondary | ICD-10-CM

## 2021-02-11 DIAGNOSIS — I442 Atrioventricular block, complete: Secondary | ICD-10-CM | POA: Diagnosis not present

## 2021-02-11 LAB — CUP PACEART REMOTE DEVICE CHECK
Battery Remaining Longevity: 99 mo
Battery Voltage: 2.98 V
Brady Statistic AP VP Percent: 88.35 %
Brady Statistic AP VS Percent: 0.05 %
Brady Statistic AS VP Percent: 10.65 %
Brady Statistic AS VS Percent: 0.95 %
Brady Statistic RA Percent Paced: 88.78 %
Brady Statistic RV Percent Paced: 99 %
Date Time Interrogation Session: 20220321194850
Implantable Lead Implant Date: 20110617
Implantable Lead Implant Date: 20110617
Implantable Lead Location: 753859
Implantable Lead Location: 753860
Implantable Pulse Generator Implant Date: 20190911
Lead Channel Impedance Value: 361 Ohm
Lead Channel Impedance Value: 399 Ohm
Lead Channel Impedance Value: 418 Ohm
Lead Channel Impedance Value: 475 Ohm
Lead Channel Pacing Threshold Amplitude: 0.75 V
Lead Channel Pacing Threshold Amplitude: 1 V
Lead Channel Pacing Threshold Pulse Width: 0.4 ms
Lead Channel Pacing Threshold Pulse Width: 0.4 ms
Lead Channel Sensing Intrinsic Amplitude: 0.625 mV
Lead Channel Sensing Intrinsic Amplitude: 0.625 mV
Lead Channel Sensing Intrinsic Amplitude: 14.625 mV
Lead Channel Sensing Intrinsic Amplitude: 14.625 mV
Lead Channel Setting Pacing Amplitude: 1.5 V
Lead Channel Setting Pacing Amplitude: 2.5 V
Lead Channel Setting Pacing Pulse Width: 0.4 ms
Lead Channel Setting Sensing Sensitivity: 4 mV

## 2021-02-11 NOTE — Progress Notes (Signed)
Cardiology office note    Date:  02/17/2021   ID:  ISAYAH IGNASIAK, DOB 06-17-1924, MRN [id number] PCP:  Deland Pretty, MD  Cardiologist:  Sanda Klein, MD  Electrophysiologist:  None   Evaluation Performed:  Follow-Up Visit  Chief Complaint: Complete heart block, pacemaker, atrial fibrillation  History of Present Illness:    William [name] is a 87 y.o. male with sinus node dysfunction and complete heart block, dual-chamber permanent pacemaker (Medtronic 2011, generator change 2019), history of surgically repaired AAA, hypertension and hyperlipidemia.    He has d a 2 hour atrial flutter episode in 2020 and a 12-hour atrial fibrillation event in November 2021, both asymptomatic (100% V paced). He has had brief PAT since then, no new atrial fibrillation.  Doing well The patient specifically denies any chest pain at rest exertion, dyspnea at rest or with exertion, orthopnea, paroxysmal nocturnal dyspnea, syncope, palpitations, focal neurological deficits, intermittent claudication, lower extremity edema, unexplained weight gain, cough, hemoptysis or wheezing. No falls or bleeding.  He does not have a history of stroke TIA or systemic embolism.  Does have well-treated systemic hypertension and history of AAA.  He does not have known coronary/vascular disease and does not have diabetes.  CHA2DS2-VASc 4 (age 73, HTN, PAD).  Pacemaker function is otherwise normal.  Prior to the onset of atrial fibrillation he has roughly 89% atrial pacing and 99% ventricular pacing.  Activity level remains excellent at about 4.3 hours a day.  His device is a Medtronic Azure dual-chamber pacemaker implanted in 2019 and the leads are a Medtronic 5086 leads.  All the lead parameters are excellent.  Estimated generator longevity is 8.2 years.   Past Medical History:  Diagnosis Date  . AAA (abdominal aortic aneurysm) ([place])   . Atrioventricular block   . Cardiac pacemaker 05/09/10   medtronic dual chamber  .  Dyslipidemia   . Nonsustained ventricular tachycardia ([place])    Past Surgical History:  Procedure Laterality Date  . ABDOMINAL AORTIC ANEURYSM REPAIR  08/02/08   Dr. Donnetta Hutching  . MENISCUS REPAIR     both knees  . PPM GENERATOR CHANGEOUT N/A 08/03/2018   Procedure: PPM GENERATOR CHANGEOUT;  Surgeon: Sanda Klein, MD;  Location: [place] [facility];  Service: Cardiovascular;  Laterality: N/A;  . RETINAL TEAR REPAIR CRYOTHERAPY       Current Meds  Medication Sig  . ELIQUIS 5 MG TABS tablet TAKE 1 TABLET (5 MG TOTAL) BY MOUTH 2 (TWO) TIMES DAILY. LABS NEEDED FOR FURTHER REFILLS  . lisinopril-hydrochlorothiazide (ZESTORETIC) 20-12.5 MG tablet TAKE 1 TABLET BY MOUTH EVERY DAY  . metoprolol succinate (TOPROL-XL) 25 MG 24 hr tablet Take 1 tablet (25 mg total) by mouth daily.  . Multiple Vitamin (MULTIVITAMIN) tablet Take 1 tablet by mouth daily.  [name] SYMPROIC 0.2 MG TABS Take 1 tablet by mouth daily.     Allergies:   Statins   Social History   Tobacco Use  . Smoking status: Former Smoker    Quit date: 11/22/1974    Years since quitting: 46.2  . Smokeless tobacco: Never Used  Vaping Use  . Vaping Use: Never used  Substance Use Topics  . Alcohol use: Yes    Alcohol/week: 7.0 standard drinks    Types: 7 Standard drinks or equivalent per week  . Drug use: No     Family Hx: The patient's family history is not on file.  ROS:   Please see the history of present illness.    All  other systems reviewed and are negative.   Prior CV studies:   The following studies were reviewed today: Most recent pacemaker download February 11, 2021  Labs/Other Tests and Data Reviewed:    EKG: Atrial paced, ventricular paced rhythm on intracardiac electrogram.  QTc 472 ms  Recent Labs: 12/23/2020: BUN 28; Creatinine, Ser 1.08; Hemoglobin 13.5; Platelets 202; Potassium 5.2; Sodium 141   Recent Lipid Panel Lab Results  Component Value Date/Time   CHOL 182 05/29/2013 08:10 AM   TRIG 103 05/29/2013  08:10 AM   HDL 39 (L) 05/29/2013 08:10 AM   CHOLHDL 4.7 05/29/2013 08:10 AM   LDLCALC 122 (H) 05/29/2013 08:10 AM    Wt Readings from Last 3 Encounters:  02/11/21 135 lb (61.2 kg)  10/31/20 138 lb (62.6 kg)  05/10/20 138 lb (62.6 kg)     Objective:    Vital Signs:  BP 122/70   Pulse 74   Ht 5\' 8"  (1.727 m)   Wt 135 lb (61.2 kg)   SpO2 96%   BMI 20.53 kg/m     General: Alert, oriented x3, no distress, lean and fit, looks younger than age and quite agile. Healthy L subclavian PM site Head: no evidence of trauma, PERRL, EOMI, no exophtalmos or lid lag, no myxedema, no xanthelasma; normal ears, nose and oropharynx Neck: normal jugular venous pulsations and no hepatojugular reflux; brisk carotid pulses without delay and no carotid bruits Chest: clear to auscultation, no signs of consolidation by percussion or palpation, normal fremitus, symmetrical and full respiratory excursions Cardiovascular: normal position and quality of the apical impulse, regular rhythm, normal first and paradoxically split second heart sounds, no murmurs, rubs or gallops Abdomen: no tenderness or distention, no masses by palpation, no abnormal pulsatility or arterial bruits, normal bowel sounds, no hepatosplenomegaly Extremities: no clubbing, cyanosis or edema; 2+ radial, ulnar and brachial pulses bilaterally; 2+ right femoral, posterior tibial and dorsalis pedis pulses; 2+ left femoral, posterior tibial and dorsalis pedis pulses; no subclavian or femoral bruits Neurological: grossly nonfocal Psych: Normal mood and affect   ASSESSMENT & PLAN:    1. CHB (complete heart block) ([place])   2. SSS (sick sinus syndrome) ([facility])   3. Paroxysmal atrial fibrillation ([place])   4. Long term (current) use of anticoagulants   5. NSVT (nonsustained ventricular tachycardia) ([facility])   6. Pacemaker   7. Abdominal aortic aneurysm (AAA) without rupture ([place])   8. Essential hypertension   9. Hypercholesterolemia      1. CHB:  Pacemaker dependent. 2. SSS: Close to 100 % A paced as well. Heart rate histogram distribution appears appropriate. Does not have symptoms of chronotropic incompetence. Very active. 3. Atrial fibrillation: On Eliquis. No indication for antiarrhythmics. 4. Anticoagulation: no bleeding problems to date. 5. NSVT: A single 7-beat event in last 3 months.  We tried to treat with beta-blockers and higher doses in the past and this was poorly tolerated. 6. Pacemaker: Continue remote downloads Q 3 months. 7. AAA s/p repair: overdue for follow-up ultrasound. 8. HTN: controlled 9. HLP: followed at [facility].    Signed, Sanda Klein, MD  02/17/2021 7:14 PM    [place]

## 2021-02-11 NOTE — Patient Instructions (Signed)

## 2021-02-20 NOTE — Progress Notes (Signed)
Remote pacemaker transmission.   

## 2021-03-12 DIAGNOSIS — Z79891 Long term (current) use of opiate analgesic: Secondary | ICD-10-CM | POA: Diagnosis not present

## 2021-03-12 DIAGNOSIS — G894 Chronic pain syndrome: Secondary | ICD-10-CM | POA: Diagnosis not present

## 2021-03-12 DIAGNOSIS — M170 Bilateral primary osteoarthritis of knee: Secondary | ICD-10-CM | POA: Diagnosis not present

## 2021-04-22 ENCOUNTER — Inpatient Hospital Stay (HOSPITAL_BASED_OUTPATIENT_CLINIC_OR_DEPARTMENT_OTHER)
Admission: EM | Admit: 2021-04-22 | Discharge: 2021-05-23 | DRG: 871 | Disposition: E | Payer: Medicare Other | Attending: Internal Medicine | Admitting: Internal Medicine

## 2021-04-22 ENCOUNTER — Other Ambulatory Visit: Payer: Self-pay

## 2021-04-22 ENCOUNTER — Emergency Department (HOSPITAL_BASED_OUTPATIENT_CLINIC_OR_DEPARTMENT_OTHER): Payer: Medicare Other

## 2021-04-22 ENCOUNTER — Encounter (HOSPITAL_BASED_OUTPATIENT_CLINIC_OR_DEPARTMENT_OTHER): Payer: Self-pay | Admitting: Obstetrics and Gynecology

## 2021-04-22 DIAGNOSIS — J90 Pleural effusion, not elsewhere classified: Secondary | ICD-10-CM | POA: Diagnosis not present

## 2021-04-22 DIAGNOSIS — I214 Non-ST elevation (NSTEMI) myocardial infarction: Secondary | ICD-10-CM | POA: Diagnosis not present

## 2021-04-22 DIAGNOSIS — Z87891 Personal history of nicotine dependence: Secondary | ICD-10-CM | POA: Diagnosis not present

## 2021-04-22 DIAGNOSIS — R0902 Hypoxemia: Secondary | ICD-10-CM | POA: Diagnosis not present

## 2021-04-22 DIAGNOSIS — R29818 Other symptoms and signs involving the nervous system: Secondary | ICD-10-CM | POA: Diagnosis not present

## 2021-04-22 DIAGNOSIS — R0602 Shortness of breath: Secondary | ICD-10-CM | POA: Diagnosis present

## 2021-04-22 DIAGNOSIS — I480 Paroxysmal atrial fibrillation: Secondary | ICD-10-CM | POA: Diagnosis present

## 2021-04-22 DIAGNOSIS — R29706 NIHSS score 6: Secondary | ICD-10-CM | POA: Diagnosis not present

## 2021-04-22 DIAGNOSIS — I495 Sick sinus syndrome: Secondary | ICD-10-CM | POA: Diagnosis present

## 2021-04-22 DIAGNOSIS — R0600 Dyspnea, unspecified: Secondary | ICD-10-CM | POA: Diagnosis not present

## 2021-04-22 DIAGNOSIS — Z7189 Other specified counseling: Secondary | ICD-10-CM | POA: Diagnosis not present

## 2021-04-22 DIAGNOSIS — J189 Pneumonia, unspecified organism: Secondary | ICD-10-CM | POA: Diagnosis not present

## 2021-04-22 DIAGNOSIS — R7989 Other specified abnormal findings of blood chemistry: Secondary | ICD-10-CM | POA: Diagnosis not present

## 2021-04-22 DIAGNOSIS — J9601 Acute respiratory failure with hypoxia: Secondary | ICD-10-CM | POA: Diagnosis present

## 2021-04-22 DIAGNOSIS — R6520 Severe sepsis without septic shock: Secondary | ICD-10-CM | POA: Diagnosis present

## 2021-04-22 DIAGNOSIS — Z7901 Long term (current) use of anticoagulants: Secondary | ICD-10-CM

## 2021-04-22 DIAGNOSIS — I2510 Atherosclerotic heart disease of native coronary artery without angina pectoris: Secondary | ICD-10-CM | POA: Diagnosis present

## 2021-04-22 DIAGNOSIS — Z950 Presence of cardiac pacemaker: Secondary | ICD-10-CM | POA: Diagnosis not present

## 2021-04-22 DIAGNOSIS — Z66 Do not resuscitate: Secondary | ICD-10-CM | POA: Diagnosis not present

## 2021-04-22 DIAGNOSIS — I6349 Cerebral infarction due to embolism of other cerebral artery: Secondary | ICD-10-CM | POA: Diagnosis not present

## 2021-04-22 DIAGNOSIS — I2699 Other pulmonary embolism without acute cor pulmonale: Secondary | ICD-10-CM | POA: Diagnosis not present

## 2021-04-22 DIAGNOSIS — Z20822 Contact with and (suspected) exposure to covid-19: Secondary | ICD-10-CM | POA: Diagnosis present

## 2021-04-22 DIAGNOSIS — I10 Essential (primary) hypertension: Secondary | ICD-10-CM | POA: Diagnosis present

## 2021-04-22 DIAGNOSIS — R059 Cough, unspecified: Secondary | ICD-10-CM | POA: Diagnosis not present

## 2021-04-22 DIAGNOSIS — I509 Heart failure, unspecified: Secondary | ICD-10-CM

## 2021-04-22 DIAGNOSIS — I712 Thoracic aortic aneurysm, without rupture, unspecified: Secondary | ICD-10-CM | POA: Diagnosis present

## 2021-04-22 DIAGNOSIS — I6389 Other cerebral infarction: Secondary | ICD-10-CM | POA: Diagnosis not present

## 2021-04-22 DIAGNOSIS — G9389 Other specified disorders of brain: Secondary | ICD-10-CM | POA: Diagnosis not present

## 2021-04-22 DIAGNOSIS — R319 Hematuria, unspecified: Secondary | ICD-10-CM | POA: Diagnosis not present

## 2021-04-22 DIAGNOSIS — Z515 Encounter for palliative care: Secondary | ICD-10-CM | POA: Diagnosis not present

## 2021-04-22 DIAGNOSIS — R778 Other specified abnormalities of plasma proteins: Secondary | ICD-10-CM | POA: Diagnosis present

## 2021-04-22 DIAGNOSIS — A419 Sepsis, unspecified organism: Secondary | ICD-10-CM | POA: Diagnosis not present

## 2021-04-22 DIAGNOSIS — I639 Cerebral infarction, unspecified: Secondary | ICD-10-CM | POA: Diagnosis not present

## 2021-04-22 DIAGNOSIS — H53462 Homonymous bilateral field defects, left side: Secondary | ICD-10-CM | POA: Diagnosis not present

## 2021-04-22 DIAGNOSIS — E8770 Fluid overload, unspecified: Secondary | ICD-10-CM | POA: Diagnosis present

## 2021-04-22 DIAGNOSIS — J811 Chronic pulmonary edema: Secondary | ICD-10-CM | POA: Diagnosis not present

## 2021-04-22 DIAGNOSIS — G319 Degenerative disease of nervous system, unspecified: Secondary | ICD-10-CM | POA: Diagnosis not present

## 2021-04-22 DIAGNOSIS — J9690 Respiratory failure, unspecified, unspecified whether with hypoxia or hypercapnia: Secondary | ICD-10-CM

## 2021-04-22 DIAGNOSIS — E785 Hyperlipidemia, unspecified: Secondary | ICD-10-CM | POA: Diagnosis present

## 2021-04-22 DIAGNOSIS — R0689 Other abnormalities of breathing: Secondary | ICD-10-CM | POA: Diagnosis not present

## 2021-04-22 DIAGNOSIS — J9691 Respiratory failure, unspecified with hypoxia: Secondary | ICD-10-CM | POA: Diagnosis present

## 2021-04-22 DIAGNOSIS — I517 Cardiomegaly: Secondary | ICD-10-CM | POA: Diagnosis not present

## 2021-04-22 HISTORY — DX: Paroxysmal atrial fibrillation: I48.0

## 2021-04-22 LAB — BRAIN NATRIURETIC PEPTIDE: B Natriuretic Peptide: 499.1 pg/mL — ABNORMAL HIGH (ref 0.0–100.0)

## 2021-04-22 LAB — LACTIC ACID, PLASMA: Lactic Acid, Venous: 3 mmol/L (ref 0.5–1.9)

## 2021-04-22 LAB — COMPREHENSIVE METABOLIC PANEL
ALT: 10 U/L (ref 0–44)
AST: 23 U/L (ref 15–41)
Albumin: 3.3 g/dL — ABNORMAL LOW (ref 3.5–5.0)
Alkaline Phosphatase: 69 U/L (ref 38–126)
Anion gap: 15 (ref 5–15)
BUN: 32 mg/dL — ABNORMAL HIGH (ref 8–23)
CO2: 19 mmol/L — ABNORMAL LOW (ref 22–32)
Calcium: 8.8 mg/dL — ABNORMAL LOW (ref 8.9–10.3)
Chloride: 100 mmol/L (ref 98–111)
Creatinine, Ser: 0.98 mg/dL (ref 0.61–1.24)
GFR, Estimated: >60 mL/min (ref >60)
Glucose, Bld: 160 mg/dL — ABNORMAL HIGH (ref 70–99)
Potassium: 4.1 mmol/L (ref 3.5–5.1)
Sodium: 134 mmol/L — ABNORMAL LOW (ref 135–145)
Total Bilirubin: 0.8 mg/dL (ref 0.3–1.2)
Total Protein: 6 g/dL — ABNORMAL LOW (ref 6.5–8.1)

## 2021-04-22 LAB — RESP PANEL BY RT-PCR (FLU A&B, COVID) ARPGX2
Influenza A by PCR: NEGATIVE
Influenza B by PCR: NEGATIVE
SARS Coronavirus 2 by RT PCR: NEGATIVE

## 2021-04-22 LAB — TROPONIN I (HIGH SENSITIVITY)
Troponin I (High Sensitivity): 86 ng/L — ABNORMAL HIGH (ref <18)
Troponin I (High Sensitivity): 100 ng/L (ref <18)
Troponin I (High Sensitivity): 85 ng/L — ABNORMAL HIGH (ref <18)

## 2021-04-22 LAB — PROCALCITONIN: Procalcitonin: 0.18 ng/mL

## 2021-04-22 LAB — CBC
HCT: 36.2 % — ABNORMAL LOW (ref 39.0–52.0)
Hemoglobin: 12.4 g/dL — ABNORMAL LOW (ref 13.0–17.0)
MCH: 30.7 pg (ref 26.0–34.0)
MCHC: 34.3 g/dL (ref 30.0–36.0)
MCV: 89.6 fL (ref 80.0–100.0)
Platelets: 303 10*3/uL (ref 150–400)
RBC: 4.04 MIL/uL — ABNORMAL LOW (ref 4.22–5.81)
RDW: 13.2 % (ref 11.5–15.5)
WBC: 12.8 10*3/uL — ABNORMAL HIGH (ref 4.0–10.5)
nRBC: 0 % (ref 0.0–0.2)

## 2021-04-22 LAB — D-DIMER, QUANTITATIVE: D-Dimer, Quant: 12.33 ug/mL-FEU — ABNORMAL HIGH (ref 0.00–0.50)

## 2021-04-22 LAB — MAGNESIUM: Magnesium: 1.8 mg/dL (ref 1.7–2.4)

## 2021-04-22 MED ORDER — SODIUM CHLORIDE 0.9 % IV SOLN
1.0000 g | INTRAVENOUS | Status: DC
  Administered 2021-04-23 – 2021-04-24 (×2): 1 g via INTRAVENOUS
  Filled 2021-04-22: qty 1
  Filled 2021-04-22 (×2): qty 10

## 2021-04-22 MED ORDER — SODIUM CHLORIDE 0.9 % IV SOLN
500.0000 mg | Freq: Once | INTRAVENOUS | Status: AC
  Administered 2021-04-22: 500 mg via INTRAVENOUS
  Filled 2021-04-22: qty 500

## 2021-04-22 MED ORDER — METHYLPREDNISOLONE SODIUM SUCC 125 MG IJ SOLR
125.0000 mg | Freq: Once | INTRAMUSCULAR | Status: AC
  Administered 2021-04-22: 125 mg via INTRAVENOUS
  Filled 2021-04-22: qty 2

## 2021-04-22 MED ORDER — FUROSEMIDE 10 MG/ML IJ SOLN
40.0000 mg | Freq: Once | INTRAMUSCULAR | Status: AC
  Administered 2021-04-22: 40 mg via INTRAVENOUS
  Filled 2021-04-22: qty 4

## 2021-04-22 MED ORDER — ASPIRIN 325 MG PO TABS
325.0000 mg | ORAL_TABLET | Freq: Once | ORAL | Status: DC
  Filled 2021-04-22 (×2): qty 1

## 2021-04-22 MED ORDER — IOHEXOL 350 MG/ML SOLN
75.0000 mL | Freq: Once | INTRAVENOUS | Status: AC | PRN
  Administered 2021-04-22: 75 mL via INTRAVENOUS

## 2021-04-22 MED ORDER — MAGNESIUM SULFATE 2 GM/50ML IV SOLN
2.0000 g | Freq: Once | INTRAVENOUS | Status: AC
  Administered 2021-04-22: 2 g via INTRAVENOUS
  Filled 2021-04-22: qty 50

## 2021-04-22 MED ORDER — APIXABAN 5 MG PO TABS
5.0000 mg | ORAL_TABLET | Freq: Two times a day (BID) | ORAL | Status: DC
  Administered 2021-04-22 – 2021-04-23 (×2): 5 mg via ORAL
  Filled 2021-04-22 (×2): qty 1

## 2021-04-22 MED ORDER — METOPROLOL SUCCINATE ER 25 MG PO TB24
25.0000 mg | ORAL_TABLET | Freq: Every day | ORAL | Status: DC
  Administered 2021-04-23 – 2021-04-24 (×2): 25 mg via ORAL
  Filled 2021-04-22 (×2): qty 1

## 2021-04-22 MED ORDER — GUAIFENESIN ER 600 MG PO TB12: 600.0000 mg | ORAL_TABLET | Freq: Two times a day (BID) | ORAL | Status: DC | PRN

## 2021-04-22 MED ORDER — ACETAMINOPHEN 650 MG RE SUPP: 650.0000 mg | Freq: Four times a day (QID) | RECTAL | Status: DC | PRN

## 2021-04-22 MED ORDER — SODIUM CHLORIDE 0.9 % IV SOLN
1.0000 g | Freq: Once | INTRAVENOUS | Status: AC
  Administered 2021-04-22: 1 g via INTRAVENOUS
  Filled 2021-04-22: qty 10

## 2021-04-22 MED ORDER — SODIUM CHLORIDE 0.9 % IV SOLN
500.0000 mg | INTRAVENOUS | Status: DC
  Administered 2021-04-23 – 2021-04-24 (×2): 500 mg via INTRAVENOUS
  Filled 2021-04-22 (×3): qty 500

## 2021-04-22 MED ORDER — ACETAMINOPHEN 325 MG PO TABS: 650.0000 mg | ORAL_TABLET | Freq: Four times a day (QID) | ORAL | Status: DC | PRN

## 2021-04-22 NOTE — ED Provider Notes (Signed)
[place] EMERGENCY DEPT Provider Note   CSN: [id number] Arrival date & time: 04/01/2021  4503     History Chief Complaint  Patient presents with  . Shortness of Breath    William [name] is a 87 y.o. male.  HPI  87 year old man history of AV block, status post pacemaker, dyslipidemia, V. tach, AAA presents today complaining of dyspnea.  He states that last night he began and short of breath when he got up and walked.  He has had some cough with brownish colored sputum.  He has not had similar symptoms before.  He denies fever, chills, chest pain, history of PE or DVT or DVT risk factors.  He is on Eliquis for history of A. fib.  He reports no prior COVID infections.  He is vaccinated and has had a booster.  He denies any known exposures to [facility] reported that he needed nonrebreather and nasal cannula.  On arrival to the ED his oxygen was removed and oxygen saturations were 81% before having nasal cannula placed.    Past Medical History:  Diagnosis Date  . AAA (abdominal aortic aneurysm) ([place])   . Atrioventricular block   . Cardiac pacemaker 05/09/10   medtronic dual chamber  . Dyslipidemia   . Nonsustained ventricular tachycardia [facility])     Patient Active Problem List   Diagnosis Date Noted  . Pacemaker battery depletion 08/03/2018  . SSS (sick sinus syndrome) ([place]) 06/12/2018  . PAD (peripheral artery disease) ([place]) 06/12/2018  . Ventricular tachycardia, nonsustained ([place]) 04/17/2013  . Atrial standstill 04/17/2013  . Complete heart block ([place]) 04/17/2013  . Pacemaker 04/17/2013  . Essential hypertension 04/17/2013  . AAA (abdominal aortic aneurysm) ([place]) 04/17/2013  . Hyperlipidemia 04/17/2013    Past Surgical History:  Procedure Laterality Date  . ABDOMINAL AORTIC ANEURYSM REPAIR  08/02/08   Dr. Donnetta Hutching  . MENISCUS REPAIR     both knees  . PPM GENERATOR CHANGEOUT N/A 08/03/2018   Procedure: PPM GENERATOR CHANGEOUT;  Surgeon: Sanda Klein, MD;   Location: [place] [facility];  Service: Cardiovascular;  Laterality: N/A;  . RETINAL TEAR REPAIR CRYOTHERAPY         History reviewed. No pertinent family history.  Social History   Tobacco Use  . Smoking status: Former Smoker    Quit date: 11/22/1974    Years since quitting: 46.4  . Smokeless tobacco: Never Used  Vaping Use  . Vaping Use: Never used  Substance Use Topics  . Alcohol use: Yes    Alcohol/week: 7.0 standard drinks    Types: 7 Standard drinks or equivalent per week  . Drug use: No    Home Medications Prior to Admission medications   Medication Sig Start Date End Date Taking? Authorizing Provider  ELIQUIS 5 MG TABS tablet TAKE 1 TABLET (5 MG TOTAL) BY MOUTH 2 (TWO) TIMES DAILY. LABS NEEDED FOR FURTHER REFILLS 12/30/20  Yes Croitoru, Mihai, MD  lisinopril-hydrochlorothiazide (ZESTORETIC) 20-12.5 MG tablet TAKE 1 TABLET BY MOUTH EVERY DAY 08/27/20  Yes Crenshaw, Denice Bors, MD  metoprolol succinate (TOPROL-XL) 25 MG 24 hr tablet Take 1 tablet (25 mg total) by mouth daily. 10/31/20  Yes Croitoru, Mihai, MD  Multiple Vitamin (MULTIVITAMIN) tablet Take 1 tablet by mouth daily.   Yes [provider]  SYMPROIC 0.2 MG TABS Take 1 tablet by mouth daily. 09/28/20  Yes [provider]    Allergies    Statins  Review of Systems   Review of Systems  All other  systems reviewed and are negative.   Physical Exam Updated Vital Signs BP (!) 127/56 (BP Location: Left Arm)   Pulse 70   Temp 97.8 F (36.6 C) (Oral)   Resp (!) 23   Ht 1.727 m (5\' 8" )   Wt 61.2 kg   SpO2 (!) 89%   BMI 20.53 kg/m   Physical Exam Vitals and nursing note reviewed.  Constitutional:      General: He is not in acute distress.    Appearance: He is well-developed.  HENT:     Head: Normocephalic.  Eyes:     Pupils: Pupils are equal, round, and reactive to light.  Cardiovascular:     Rate and Rhythm: Normal rate. Rhythm irregular.  Pulmonary:     Effort: Pulmonary effort is  normal.     Breath sounds: Examination of the left-lower field reveals rhonchi. Rhonchi present. No decreased breath sounds.  Abdominal:     Palpations: Abdomen is soft.  Musculoskeletal:        General: Normal range of motion.     Cervical back: Normal range of motion.  Skin:    General: Skin is warm.     Capillary Refill: Capillary refill takes less than 2 seconds.  Neurological:     General: No focal deficit present.     Mental Status: He is alert.  Psychiatric:        Mood and Affect: Mood normal.     ED Results / Procedures / Treatments   Labs (all labs ordered are listed, but only abnormal results are displayed) Labs Reviewed  CBC - Abnormal; Notable for the following components:      Result Value   WBC 12.8 (*)    RBC 4.04 (*)    Hemoglobin 12.4 (*)    HCT 36.2 (*)    All other components within normal limits  COMPREHENSIVE METABOLIC PANEL - Abnormal; Notable for the following components:   Sodium 134 (*)    CO2 19 (*)    Glucose, Bld 160 (*)    BUN 32 (*)    Calcium 8.8 (*)    Total Protein 6.0 (*)    Albumin 3.3 (*)    All other components within normal limits  D-DIMER, QUANTITATIVE - Abnormal; Notable for the following components:   D-Dimer, Quant 12.33 (*)    All other components within normal limits  TROPONIN I (HIGH SENSITIVITY) - Abnormal; Notable for the following components:   Troponin I (High Sensitivity) 86 (*)    All other components within normal limits  RESP PANEL BY RT-PCR (FLU A&B, COVID) [id number]    EKG EKG Interpretation  Date/Time:  Tuesday Apr 22 2021 09:53:23 EDT Ventricular Rate:  70 PR Interval:    QRS Duration: 160 QT Interval:  470 QTC Calculation: 508 R Axis:   -64 Text Interpretation: Atrial fibrilltion Ventricular premature complex Electronic ventricular pacemaker occasional pvc No significant change since last tracing Confirmed by Pattricia Boss [phone]) on 04/11/2021 10:07:00 AM   Radiology CT Angio Chest PE W and/or Wo  Contrast  Result Date: 04/09/2021 CLINICAL DATA:  PE suspected, shortness of breath, cough, productive sputum EXAM: CT ANGIOGRAPHY CHEST WITH CONTRAST TECHNIQUE: Multidetector CT imaging of the chest was performed using the standard protocol during bolus administration of intravenous contrast. Multiplanar CT image reconstructions and MIPs were obtained to evaluate the vascular anatomy. CONTRAST:  6mL OMNIPAQUE IOHEXOL 350 MG/ML SOLN COMPARISON:  None. FINDINGS: Cardiovascular: Satisfactory opacification of the pulmonary arteries to the segmental level. No  evidence of pulmonary embolism. Cardiomegaly. Left coronary artery calcifications. Left chest multi lead pacer. No pericardial effusion. Aortic atherosclerosis. Focal aneurysm of the proximal, left aspect of the aortic arch, maximum dimensions 4.5 x 3.9 cm (series 7, image 77, series 8, image 96). Mediastinum/Nodes: No enlarged mediastinal, hilar, or axillary lymph nodes. Thyroid gland, trachea, and esophagus demonstrate no significant findings. Lungs/Pleura: Trace bilateral pleural effusions and associated atelectasis or consolidation. Diffuse, somewhat geographic ground-glass airspace opacity throughout the lungs, with mild interlobular septal thickening. There is some degree of underlying fibrotic interstitial opacity, poorly characterized. Upper Abdomen: No acute abnormality. Musculoskeletal: No chest wall abnormality. No acute or significant osseous findings. Review of the MIP images confirms the above findings. IMPRESSION: 1. Negative examination for pulmonary embolism. 2. Trace bilateral pleural effusions and associated atelectasis or consolidation. 3. Diffuse, somewhat geographic ground-glass airspace opacity throughout the lungs, with mild interlobular septal thickening. Findings are most consistent with infection. 4. There is some degree of underlying fibrotic interstitial opacity, poorly characterized. This could be further evaluated on a nonemergent  basis by ILD protocol CT examination of the chest at the resolution of acute presentation. 5. Cardiomegaly and coronary artery disease. 6. Focal aneurysm of the proximal, left aspect of the aortic arch, maximum dimensions 4.5 x 3.9 cm. Recommend semi-annual imaging followup by CTA or MRA and referral to cardiothoracic surgery if not already obtained. This recommendation follows 2010 ACCF/AHA/AATS/ACR/ASA/SCA/SCAI/SIR/STS/SVM Guidelines for the Diagnosis and Management of Patients With Thoracic Aortic Disease. Circulation. 2010; 121: I778-E42. Aortic aneurysm NOS (ICD10-I71.9) Aortic Atherosclerosis (ICD10-I70.0). Electronically Signed   By: Eddie Candle M.D.   On: 04/21/2021 12:11   DG Chest Port 1 View  Result Date: 04/12/2021 CLINICAL DATA:  Dyspnea EXAM: PORTABLE CHEST 1 VIEW COMPARISON:  2011 FINDINGS: Bilateral interstitial opacities. No significant pleural effusion. No pneumothorax. Cardiomediastinal contours are within normal limits with normal heart size. There is atherosclerotic calcification along the thoracic aorta. Left chest wall dual lead pacemaker present. Advanced degenerative changes at the left glenohumeral joint. IMPRESSION: Bilateral interstitial opacities may reflect atypical/viral pneumonia or edema in the appropriate setting. Electronically Signed   By: Macy Mis M.D.   On: 04/03/2021 10:18    Procedures Procedures   Medications Ordered in ED Medications - No data to display  ED Course  I have reviewed the triage vital signs and the nursing notes.  Pertinent labs & imaging results that were available during my care of the patient were reviewed by me and considered in my medical decision making (see chart for details).    MDM Rules/Calculators/A&P                          Dyspnea with hypoxia- cxr with multiple nodular area-?viral infection vs edema.  D-dimer elevate at 12.  BNP added Awaiting cta chest. CTA chest without evidence of PE-trace bilateral effusions and  underlying fibrotic interstitial opacity, groundglass appearance felt to be most consistent with infection.  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

## 2021-04-22 NOTE — H&P (Signed)
History and Physical    PLEASE NOTE THAT DRAGON DICTATION SOFTWARE WAS USED IN THE CONSTRUCTION OF THIS NOTE.   William [name] VQQ:[id number] DOB: 05-21-1924 DOA: 04/13/2021  PCP: Deland Pretty, MD Patient coming from: home   I have personally briefly reviewed patient's old medical records in [place]  Chief Complaint: Shortness of breath  HPI: William [name] is a 87 y.o. male with medical history significant for paroxysmal atrial fibrillation complicated by sick sinus syndrome status post pacemaker placement in 2011 and chronically anticoagulated on Eliquis, hypertension, AAA status postrepair in September 2009, who is admitted to [facility] on 04/21/2021 by way of transfer from  [facility] ED with acute hypoxic respiratory failure due to suspected severe sepsis in the setting of bilateral pneumonia after presenting from home to the latter facility complaining of shortness of breath.   The patient reports 2 days of shortness of breath associated with new onset productive cough with brown-colored sputum.  He also notes associated subjective fever, but has not taken his temperature at home.  Not associated with chills, full body rigors, or generalized myalgias.  Denies any associated orthopnea, PND, or new onset peripheral edema.  He denies experiencing any associated or recent chest pain, and denies any new back discomfort.  Not associated with any diaphoresis, palpitations, nausea, vomiting, presyncope, or syncope.  Denies any associated wheezing, hemoptysis, new lower extremity erythema, or calf tenderness.  Denies any recent trauma, travel, or recent known COVID-19 exposures.  No recent melena or hematochezia.  No recent headache, neck stiffness, rhinitis, rhinorrhea, sore throat, abdominal pain, diarrhea, or rash.  He also denies any recent dysuria, gross hematuria, or change in urinary urgency/frequency.  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  Patient confirms that he is chronically anticoagulated on Eliquis, noting good compliance with this medication.  Otherwise denies any blood thinners as an outpatient, including no aspirin.  He also has a history of hypertension and AAA status postrepair in September 2009.  Denies any known baseline supplemental oxygen requirements.  He is a former smoker, having completely quit smoking in the mid 1970s, without any subsequent resumption.  Denies any known history of underlying chronic pulmonary pathology, including no known COPD, asthma.  The patient denies any known history of chronic heart failure.  Per initial chart review, no echo Identified over the last 10 years.      ED Course:  Vital signs in the ED were notable for the following: Tetramex 98.3, heart rate 70-73, blood pressure 110/89, 134/88, respiratory rate 15-25, presenting oxygen saturation noted to be in the high 80s on room air, with ensuing improvement to 97 to 99% on 5 L nasal cannula.  Labs were notable for the following: CMP was notable for the following: Sodium 134, potassium 4.1, bicarbonate 19, anion gap 15, BUN 32, creatinine 0.98 relative to baseline range of 0.8-1.1, and relative to most recent prior serum creatinine data point of 1.08 in January 2022, glucose 160, and liver enzymes found to be within normal limits.  BNP 5 Laverda Stribling, without any prior data point available for point comparison.  High-sensitivity troponin high initially noted to be 86, with repeat value trending up to 100, in the context of no prior high-sensitivity troponin I data points available in the EMR for point comparison.  CBC notable for white cell count of 12,800, hemoglobin 12.4.  D-dimer 12.  Nasopharyngeal COVID-19/[place] PCR were checked  at [facility] ED today, and found to be negative.  EKG, by way of comparison to most recent prior from December 2021, showed paced rhythm with occasional  PVC, ventricular rate 70, nonspecific T wave inversion in V2, less than 1 mm ST depression in lateral leads, no evidence of ST elevation.  Chest x-ray showed bilateral interstitial opacities suggestive of atypical pneumonia versus edema, with normal heart size, and no evidence of pleural effusion or pneumothorax.  CTA chest showed no evidence of acute pulmonary embolism, while showing bibasilar airspace opacities consistent with consolidation versus atelectasis with trace bilateral pleural effusions, and showed bilateral airspace opacities with groundglass appearance with interlobular septal thickening consistent with infection, and thoracic aortic aneurysm the proximal, left aspect of the aortic arch, with maximum dimensions noted to be 4.5 x 3.9 cm without evidence of dissection.   While in [place] ED, the following were administered: Rocephin 1 g IV x1, azithromycin 500 mg IV x1, Cymetra 125 mg IV x1, and Lasix 40 mg IV x1.      Review of Systems: As per HPI otherwise 10 point review of systems negative.   Past Medical History:  Diagnosis Date  . AAA (abdominal aortic aneurysm) ([place])   . Atrioventricular block   . Cardiac pacemaker 05/09/10   medtronic dual chamber  . Dyslipidemia   . Nonsustained ventricular tachycardia ([place])     Past Surgical History:  Procedure Laterality Date  . ABDOMINAL AORTIC ANEURYSM REPAIR  08/02/08   Dr. Donnetta Hutching  . MENISCUS REPAIR     both knees  . PPM GENERATOR CHANGEOUT N/A 08/03/2018   Procedure: PPM GENERATOR CHANGEOUT;  Surgeon: Sanda Klein, MD;  Location: [facility];  Service: Cardiovascular;  Laterality: N/A;  . RETINAL TEAR REPAIR CRYOTHERAPY      Social History:  reports that he quit smoking about 46 years ago. He has never used smokeless tobacco. He reports current alcohol use of about 7.0 standard drinks of alcohol per week. He reports that he does not use drugs.   Allergies  Allergen Reactions  . Statins Other (See Comments)     Myalgia: Rosuvastatin, Atorvastatin, Simvastatin    History reviewed. No pertinent family history.  Family history reviewed and not pertinent    Prior to Admission medications   Medication Sig Start Date End Date Taking? Authorizing Provider  ELIQUIS 5 MG TABS tablet TAKE 1 TABLET (5 MG TOTAL) BY MOUTH 2 (TWO) TIMES DAILY. LABS NEEDED FOR FURTHER REFILLS 12/30/20  Yes Croitoru, Mihai, MD  lisinopril-hydrochlorothiazide (ZESTORETIC) 20-12.5 MG tablet TAKE 1 TABLET BY MOUTH EVERY DAY 08/27/20  Yes Crenshaw, Denice Bors, MD  metoprolol succinate (TOPROL-XL) 25 MG 24 hr tablet Take 1 tablet (25 mg total) by mouth daily. 10/31/20  Yes Croitoru, Mihai, MD  Multiple Vitamin (MULTIVITAMIN) tablet Take 1 tablet by mouth daily.   Yes [provider]  SYMPROIC 0.2 MG TABS Take 1 tablet by mouth daily. 09/28/20  Yes [provider]     Objective    Physical Exam: Vitals:   04/20/2021 1515 04/09/2021 1530 03/31/2021 1545 04/13/2021 1827  BP: 110/89 101/89 134/88 120/65  Pulse: 72 82 67 70  Resp: 15 (!) 24 (!) 26 17  Temp:    98.3 F (36.8 C)  TempSrc:    Oral  SpO2: 91% 97% 99% 90%  Weight:      Height:        General: appears to be stated age; alert, oriented Skin: warm, dry, no rash Head:  AT/[place] Mouth:  Oral mucosa membranes appear moist, normal dentition Neck: supple; trachea midline Heart:  RRR; did not appreciate any M/R/G Lungs: CTAB, did not appreciate any wheezes, rales, or rhonchi Abdomen: + BS; soft, ND, NT Vascular: 2+ pedal pulses b/l; 2+ radial pulses b/l Extremities: no peripheral edema, no muscle wasting Neuro: strength and sensation intact in upper and lower extremities b/l     Labs on Admission: I have personally reviewed following labs and imaging studies  CBC: Recent Labs  Lab 03/23/2021 0958  WBC 12.8*  HGB 12.4*  HCT 36.2*  MCV 89.6  PLT 798   Basic Metabolic Panel: Recent Labs  Lab 04/10/2021 0958  NA 134*  K 4.1  CL 100  CO2 19*  GLUCOSE  160*  BUN 32*  CREATININE 0.98  CALCIUM 8.8*   GFR: Estimated Creatinine Clearance: 39.9 mL/min (by C-G formula based on SCr of 0.98 mg/dL). Liver Function Tests: Recent Labs  Lab 03/29/2021 0958  AST 23  ALT 10  ALKPHOS 69  BILITOT 0.8  PROT 6.0*  ALBUMIN 3.3*   No results for input(s): LIPASE, AMYLASE in the last 168 hours. No results for input(s): AMMONIA in the last 168 hours. Coagulation Profile: No results for input(s): INR, PROTIME in the last 168 hours. Cardiac Enzymes: No results for input(s): CKTOTAL, CKMB, CKMBINDEX, TROPONINI in the last 168 hours. BNP (last 3 results) No results for input(s): PROBNP in the last 8760 hours. HbA1C: No results for input(s): HGBA1C in the last 72 hours. CBG: No results for input(s): GLUCAP in the last 168 hours. Lipid Profile: No results for input(s): CHOL, HDL, LDLCALC, TRIG, CHOLHDL, LDLDIRECT in the last 72 hours. Thyroid Function Tests: No results for input(s): TSH, T4TOTAL, FREET4, T3FREE, THYROIDAB in the last 72 hours. Anemia Panel: No results for input(s): VITAMINB12, FOLATE, FERRITIN, TIBC, IRON, RETICCTPCT in the last 72 hours. Urine analysis:    Component Value Date/Time   COLORURINE YELLOW 07/31/2008 [place] 07/31/2008 1219   LABSPEC 1.013 07/31/2008 1219   PHURINE 7.0 07/31/2008 1219   GLUCOSEU NEGATIVE 07/31/2008 1219   HGBUR NEGATIVE 07/31/2008 1219   BILIRUBINUR NEGATIVE 07/31/2008 1219   KETONESUR NEGATIVE 07/31/2008 1219   PROTEINUR NEGATIVE 07/31/2008 1219   UROBILINOGEN 1.0 07/31/2008 1219   NITRITE NEGATIVE 07/31/2008 1219   LEUKOCYTESUR  07/31/2008 1219    NEGATIVE MICROSCOPIC NOT DONE ON URINES WITH NEGATIVE PROTEIN, BLOOD, LEUKOCYTES, NITRITE, OR GLUCOSE <1000 mg/dL.    Radiological Exams on Admission: CT Angio Chest PE W and/or Wo Contrast  Result Date: 04/03/2021 CLINICAL DATA:  PE suspected, shortness of breath, cough, productive sputum EXAM: CT ANGIOGRAPHY CHEST WITH  CONTRAST TECHNIQUE: Multidetector CT imaging of the chest was performed using the standard protocol during bolus administration of intravenous contrast. Multiplanar CT image reconstructions and MIPs were obtained to evaluate the vascular anatomy. CONTRAST:  79m OMNIPAQUE IOHEXOL 350 MG/ML SOLN COMPARISON:  None. FINDINGS: Cardiovascular: Satisfactory opacification of the pulmonary arteries to the segmental level. No evidence of pulmonary embolism. Cardiomegaly. Left coronary artery calcifications. Left chest multi lead pacer. No pericardial effusion. Aortic atherosclerosis. Focal aneurysm of the proximal, left aspect of the aortic arch, maximum dimensions 4.5 x 3.9 cm (series 7, image 77, series 8, image 96). Mediastinum/Nodes: No enlarged mediastinal, hilar, or axillary lymph nodes. Thyroid gland, trachea, and esophagus demonstrate no significant findings. Lungs/Pleura: Trace bilateral pleural effusions and associated atelectasis or consolidation. Diffuse, somewhat geographic ground-glass airspace opacity throughout the lungs, with mild interlobular septal thickening.  There is some degree of underlying fibrotic interstitial opacity, poorly characterized. Upper Abdomen: No acute abnormality. Musculoskeletal: No chest wall abnormality. No acute or significant osseous findings. Review of the MIP images confirms the above findings. IMPRESSION: 1. Negative examination for pulmonary embolism. 2. Trace bilateral pleural effusions and associated atelectasis or consolidation. 3. Diffuse, somewhat geographic ground-glass airspace opacity throughout the lungs, with mild interlobular septal thickening. Findings are most consistent with infection. 4. There is some degree of underlying fibrotic interstitial opacity, poorly characterized. This could be further evaluated on a nonemergent basis by ILD protocol CT examination of the chest at the resolution of acute presentation. 5. Cardiomegaly and coronary artery disease. 6. Focal  aneurysm of the proximal, left aspect of the aortic arch, maximum dimensions 4.5 x 3.9 cm. Recommend semi-annual imaging followup by CTA or MRA and referral to cardiothoracic surgery if not already obtained. This recommendation follows 2010 ACCF/AHA/AATS/ACR/ASA/SCA/SCAI/SIR/STS/SVM Guidelines for the Diagnosis and Management of Patients With Thoracic Aortic Disease. Circulation. 2010; 121: Z662-H47. Aortic aneurysm NOS (ICD10-I71.9) Aortic Atherosclerosis (ICD10-I70.0). Electronically Signed   By: Eddie Candle M.D.   On: 04/08/2021 12:11   DG Chest Port 1 View  Result Date: 04/06/2021 CLINICAL DATA:  Dyspnea EXAM: PORTABLE CHEST 1 VIEW COMPARISON:  2011 FINDINGS: Bilateral interstitial opacities. No significant pleural effusion. No pneumothorax. Cardiomediastinal contours are within normal limits with normal heart size. There is atherosclerotic calcification along the thoracic aorta. Left chest wall dual lead pacemaker present. Advanced degenerative changes at the left glenohumeral joint. IMPRESSION: Bilateral interstitial opacities may reflect atypical/viral pneumonia or edema in the appropriate setting. Electronically Signed   By: Macy Mis M.D.   On: 03/26/2021 10:18     EKG: Independently reviewed, with result as described above.    Assessment/Plan   William [name] is a 87 y.o. male with medical history significant for paroxysmal atrial fibrillation complicated by sick sinus syndrome status post pacemaker placement in 2011 and chronically anticoagulated on Eliquis, hypertension, AAA status postrepair in September 2009, who is admitted to [facility] on 03/31/2021 by way of transfer from  [facility] ED with acute hypoxic respiratory failure due to suspected severe sepsis in the setting of bilateral pneumonia after presenting from home to the latter facility complaining of shortness of breath.   Principal Problem:   Respiratory failure with hypoxia (HCC) Active Problems:   Essential  hypertension   SOB (shortness of breath)   Severe sepsis (HCC)   CAP (community acquired pneumonia)   Elevated troponin   Thoracic aortic aneurysm (TAA) (HCC)   Paroxysmal atrial fibrillation (HCC)    #) Acute hypoxic respiratory failure: in the context of no known baseline supplemental oxygen requirements, presenting O2 sat noted to be in the high 80s, with ensuing improvement into the mid to high 90s on 5 L nasal cannula. No known chronic underlying pulmonary conditions. Presenting CXR shows showed bilateral interstitial opacities suggestive of atypical pneumonia versus edema, without additional acute cardiopulmonary process, clean no evidence of pneumothorax.  In the setting of presenting shortness of breath and elevated D-dimer, CTA chest was obtained at [facility] ED today, and showed no evidence of acute PE, but showed bibasilar airspace opacities consistent with consolidation versus atelectasis as well as additional bilateral airspace opacities with groundglass appearance with interlobular septal thickening, felt by radiology to be consistent with infection.  Furthermore, CTA chest showed no evidence of thoracic aortic aneurysm without associated evidence of dissection, while clinically, the patient denies any recent trauma, chest pain, or new  back pain to suggest underlying dissection.   At this time, differential for presenting acute hypoxic respiratory failure appears to be over severe sepsis due to bilateral pneumonia given the patient's reported shortness of breath associated with new onset productive cough with brown-colored sputum and subjective fever.  Early acutely decompensated heart failure is a possibility, given elevated BNP, although no evidence of interstitial lung airspace edema on CTA chest, clinically, this appears to be less likely at the present time relative to pneumonia.  Furthermore, the patient denies any associated orthopnea, peripheral edema, or recent weight gain, and  denies any known history of chronic heart failure, although it does not appear that he has recently undergone an echocardiogram per initial chart review.  In considering the patient's elevated BNP, his body habitus with BMI of 20 is noted, as BNP is metabolized peripherally, with the potential for false elevation on this basis in this patient. Differential also includes ACS, this is felt to be less likely as well at present time, although will warrant additional evaluation as further described below.  Of note, the patient reports absolutely no recent chest pain.  COVID-19/influenza PCR performed today and found to be negative.   Plan: further evaluation and management of presenting suspected severe sepsis due to bilateral pneumonia, including continuation of IV antibiotics, as further noted below.  We will further trend troponin values, and echocardiogram has been ordered for the morning.  Monitor on telemetry.  Monitor on continuous pulse oximetry, with as needed supplemental oxygen in order to maintain O2 sats greater than or equal to 92%.  Repeat CMP and CBC in the morning.  Check serum magnesium and phosphorus levels.  In the setting of a prior history of smoking, will also check VBG in the morning.  Flutter evidence as promptly ordered.  Will refrain from additional IV diuresis at this time, pending further evaluation for underlying heart failure, as further detailed below.     #) Severe sepsis due to bilateral pneumonia: Diagnosis on the basis of 2 days of shortness of breath associated with new onset productive cough as well as subjective fever, with imaging performed earlier today demonstrating evidence of bilateral airspace opacities, consistent with infection.  SIRS criteria met via presenting leukocytosis and tachypnea.  Patient's sepsis meets criteria to be considered severe nature on the basis of evidence of endorgan damage in the form of mildly elevated initial troponin.  Rocephin and  azithromycin were started at stress or start ED earlier today, while noting that blood cultures have not yet been collected.  Additionally, will check lactate at this time.  No evidence of associated hypotension.  No indication at this time for administration of a 30 mL/kg IVF bolus.  Given that the current differential includes the possibility, albeit less likely, of heart failure, and as the patient appears hemodynamically stable and able to eat and drink at this time, will refrain from aggressive IV fluid administration for now, will closely monitor ensuing vitals and clinical trend.  No additional source of underlying infection identified at this time, including COVID-19/influenza PCR, which were found to be negative today.  We will check urinalysis.    Plan: Check lactate now, as above.  Check blood cultures x2 and sputum culture.  We will continue Rocephin and azithromycin, which will provide atypical coverage given the somewhat atypical appearance of the patient's bilateral lower airspace opacities.  Repeat CBC with differential in the morning.  Check strep pneumoniae urine antigen as well as mycoplasma antibodies.  Add on  procalcitonin level.  Flutter valve, incentive spirometry.  UA.  As needed Mucinex.      #)Elevated troponin: mildly elevated initial troponin of 86, with repeat value demonstrating slight interval trend up to 100. No prior high sensitivity troponin I value available for point of comparison.  Suspect that this mildly elevated troponin is on the basis of supply demand mismatch in the setting of acute hypoxic respiratory failure, with differential favoring severe sepsis due to bilateral pneumonia, as further described above , as opposed to representing a type I process due to acute plaque rupture.  EKG shows nonspecific less than 1 mm ST depression in lateral leads, and no evidence of ST elevation while the patient denies any recent chest pain.  Overall, ACS felt to be less likely  at this time relative to type II supply demand mismatch, as above, will closely monitor on telemetry overnight, while further monitoring troponin values and obtaining echocardiogram in the morning, with interval evaluation and management of suspected acute epoxy respiratory failure due to severe sepsis in the setting of bilateral pneumonia of note, imaging did not demonstrate any evidence of pneumothorax, while CTA chest showed no evidence of acute pulmonary embolism.,  CTA chest showed evidence of TAA, without corresponding radiographic evidence of dissection, clinical presentation is not suggestive of aortic dissection at this time.  We will provide a one-time dose of aspirin, and resume home beta-blocker.  In setting of suspected severe sepsis, will hold home ACE inhibitor for now, pending further evaluation.  Of note, the patient reports a history of allergies to statins.    Plan: repeat troponin now and again in the AM. Monitor on telemetry. Nonspecific, less than 1 millimeter ST depression in lateral leads upon initial arrival to ED, at which time the patient was hypoxic, will repeat EKG at this time to evaluate for interval changes.  Check serum magnesium level and repeat BMP in the morning, with as needed supplementation order to maintain magnesium and potassium levels greater than or equal to 2.04.0 respectively.  Repeat CBC in the morning.  We will provide full dose aspirin x1 dose now.  Resume home beta-blocker and hold home ACE inhibitor for now, as above.  Echocardiogram ordered for the morning.  Monitor continuous pulse oximetry.  Work-up management- severe sepsis due to suspected bilateral pneumonia, as above.       #) Thoracic aortic aneurysm: Identified on today CTA chest, with maximum dimensions 4.5 x 3.9 cm.  No radiographic or clinical evidence to suggest dissection at this time.  Per radiology input, there is a recommendation for semiannual imaging follow-up by CTA or MRA.  Plan: We  will recommend semiannual imaging followed by CTA or RIMA, per the above radiology recommendations.  Continue home Toprol-XL.  Additional evaluation and management of essential hypertension, as detailed below.     #) Paroxysmal atrial fibrillation: Documented history of such, including a history of complication by sick sinus syndrome prompting pacemaker placement in 2011. In the setting of a CHA2DS2-VASc score of 3, there is an indication for the patient to be on chronic anticoagulation for thromboembolic prophylaxis. Consistent with this, the patient is chronically anticoagulated on Eliquis. Home AV nodal blocking regimen: Toprol-XL.  Per my additional chart review, I have not located any echocardiogram over the last 10 years.   Plan: monitor strict I's & O's and daily weights. Repeat BMP and CBC in the morning. Check serum magnesium level. Continue home AV nodal blocking regimen, as above.  Continue home Eliquis.  Monitor on telemetry.     #) Essential hypertension: Outpatient hypertensive regimen includes lisinopril, HCTZ, and Toprol XL.  Blood pressures have been noted to be normotensive since arrival at stressors or ED today, without any evidence of associated hypotension.  In the setting of suspected presenting severe sepsis due to bilateral pneumonia, will hold home lisinopril and HCTZ for now, will closely monitoring ensuing blood pressure.  Given the presence of mildly elevated troponin, will resume home Toprol XL at this time.    Plan: Continue home Toprol-XL.  Hold home lisinopril and HCTZ for now, as above.  Close monitoring of ensuing blood pressure via routine vital signs.      DVT prophylaxis: Continue home Eliquis Code Status: Per my discussions with the patient today, he clearly conveys his wishes to be DNR/DNI Family Communication: none Disposition Plan: Per Rounding Team Consults called: none  Admission status: Inpatient; PCU.     Of note, this patient was added by  me to the following Admit List/Treatment Team: [email or web address].      PLEASE NOTE THAT DRAGON DICTATION SOFTWARE WAS USED IN THE CONSTRUCTION OF THIS NOTE.   [facility] Pager [phone] From [place]  Otherwise, please contact night-coverage  [email or web address] Password [facility]   04/13/2021, 6:58 PM

## 2021-04-22 NOTE — ED Notes (Signed)
Attempted to give report x2 without success

## 2021-04-22 NOTE — ED Notes (Signed)
Dr Jeanell Sparrow is aware of trop

## 2021-04-22 NOTE — ED Notes (Signed)
Attempted to call report x 1  

## 2021-04-22 NOTE — Progress Notes (Signed)
Attempted to get report x 1.

## 2021-04-22 NOTE — Progress Notes (Signed)
  Received message for pt transfer: Pt seen at [facility] for cough, SOB,with CTA c/w pneumonia, respiratory failure 5L [place]. Sob since last night. No orthopnea. Covid and flu negative.  Rocephin x 1 gm and azithromycin Lasix 40 mg.  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 Pt accepted for inpt t/t of pneumonia.

## 2021-04-22 NOTE — ED Triage Notes (Signed)
Patient reports to the ER for coughing up brown sputum, ShOB, cough, and decreased appetite. Patient lives at home independently. Denies hx of COPD

## 2021-04-23 ENCOUNTER — Other Ambulatory Visit: Payer: Self-pay

## 2021-04-23 ENCOUNTER — Inpatient Hospital Stay (HOSPITAL_COMMUNITY): Payer: Medicare Other

## 2021-04-23 DIAGNOSIS — R778 Other specified abnormalities of plasma proteins: Secondary | ICD-10-CM | POA: Diagnosis not present

## 2021-04-23 DIAGNOSIS — R0902 Hypoxemia: Secondary | ICD-10-CM

## 2021-04-23 DIAGNOSIS — I10 Essential (primary) hypertension: Secondary | ICD-10-CM | POA: Diagnosis not present

## 2021-04-23 DIAGNOSIS — R0602 Shortness of breath: Secondary | ICD-10-CM

## 2021-04-23 LAB — PHOSPHORUS: Phosphorus: 3.2 mg/dL (ref 2.5–4.6)

## 2021-04-23 LAB — CBC WITH DIFFERENTIAL/PLATELET
Abs Immature Granulocytes: 0.04 10*3/uL (ref 0.00–0.07)
Basophils Absolute: 0 10*3/uL (ref 0.0–0.1)
Basophils Relative: 0 %
Eosinophils Absolute: 0 10*3/uL (ref 0.0–0.5)
Eosinophils Relative: 0 %
HCT: 36 % — ABNORMAL LOW (ref 39.0–52.0)
Hemoglobin: 12.4 g/dL — ABNORMAL LOW (ref 13.0–17.0)
Immature Granulocytes: 0 %
Lymphocytes Relative: 6 %
Lymphs Abs: 0.6 10*3/uL — ABNORMAL LOW (ref 0.7–4.0)
MCH: 31 pg (ref 26.0–34.0)
MCHC: 34.4 g/dL (ref 30.0–36.0)
MCV: 90 fL (ref 80.0–100.0)
Monocytes Absolute: 0.4 10*3/uL (ref 0.1–1.0)
Monocytes Relative: 4 %
Neutro Abs: 8.9 10*3/uL — ABNORMAL HIGH (ref 1.7–7.7)
Neutrophils Relative %: 90 %
Platelets: 266 10*3/uL (ref 150–400)
RBC: 4 MIL/uL — ABNORMAL LOW (ref 4.22–5.81)
RDW: 13.1 % (ref 11.5–15.5)
WBC: 9.9 10*3/uL (ref 4.0–10.5)
nRBC: 0 % (ref 0.0–0.2)

## 2021-04-23 LAB — BLOOD GAS, VENOUS
Acid-base deficit: 0.6 mmol/L (ref 0.0–2.0)
Bicarbonate: 22.6 mmol/L (ref 20.0–28.0)
O2 Saturation: 64.7 %
Patient temperature: 37
pCO2, Ven: 31.3 mmHg — ABNORMAL LOW (ref 44.0–60.0)
pH, Ven: 7.473 — ABNORMAL HIGH (ref 7.250–7.430)
pO2, Ven: 34.9 mmHg (ref 32.0–45.0)

## 2021-04-23 LAB — COMPREHENSIVE METABOLIC PANEL
ALT: 17 U/L (ref 0–44)
AST: 30 U/L (ref 15–41)
Albumin: 2.6 g/dL — ABNORMAL LOW (ref 3.5–5.0)
Alkaline Phosphatase: 67 U/L (ref 38–126)
Anion gap: 13 (ref 5–15)
BUN: 42 mg/dL — ABNORMAL HIGH (ref 8–23)
CO2: 22 mmol/L (ref 22–32)
Calcium: 9.1 mg/dL (ref 8.9–10.3)
Chloride: 101 mmol/L (ref 98–111)
Creatinine, Ser: 1.19 mg/dL (ref 0.61–1.24)
GFR, Estimated: 57 mL/min — ABNORMAL LOW (ref >60)
Glucose, Bld: 182 mg/dL — ABNORMAL HIGH (ref 70–99)
Potassium: 3.7 mmol/L (ref 3.5–5.1)
Sodium: 136 mmol/L (ref 135–145)
Total Bilirubin: 0.6 mg/dL (ref 0.3–1.2)
Total Protein: 6.3 g/dL — ABNORMAL LOW (ref 6.5–8.1)

## 2021-04-23 LAB — ECHOCARDIOGRAM COMPLETE
AR max vel: 1.95 cm2
AV Area VTI: 1.67 cm2
AV Area mean vel: 1.96 cm2
AV Mean grad: 3 mmHg
AV Peak grad: 5.3 mmHg
Ao pk vel: 1.15 m/s
Height: 68 in
S' Lateral: 2.7 cm
Weight: 2064 oz

## 2021-04-23 LAB — TROPONIN I (HIGH SENSITIVITY): Troponin I (High Sensitivity): 94 ng/L — ABNORMAL HIGH (ref <18)

## 2021-04-23 LAB — MAGNESIUM: Magnesium: 1.8 mg/dL (ref 1.7–2.4)

## 2021-04-23 MED ORDER — TRAMADOL HCL ER 100 MG PO TB24
100.0000 mg | ORAL_TABLET | Freq: Every day | ORAL | Status: DC
Start: 2021-04-23 — End: 2021-04-23

## 2021-04-23 MED ORDER — APIXABAN 2.5 MG PO TABS
2.5000 mg | ORAL_TABLET | Freq: Two times a day (BID) | ORAL | Status: DC
  Administered 2021-04-23 – 2021-04-24 (×3): 2.5 mg via ORAL
  Filled 2021-04-23 (×3): qty 1

## 2021-04-23 MED ORDER — HYDROCODONE-ACETAMINOPHEN 7.5-325 MG PO TABS
1.0000 | ORAL_TABLET | Freq: Four times a day (QID) | ORAL | Status: DC | PRN
  Administered 2021-04-23 – 2021-04-25 (×3): 1 via ORAL
  Filled 2021-04-23 (×3): qty 1

## 2021-04-23 MED ORDER — PERFLUTREN LIPID MICROSPHERE
1.0000 mL | INTRAVENOUS | Status: AC | PRN
  Administered 2021-04-23: 3 mL via INTRAVENOUS
  Filled 2021-04-23: qty 10

## 2021-04-23 MED ORDER — TRAMADOL HCL 50 MG PO TABS
50.0000 mg | ORAL_TABLET | Freq: Two times a day (BID) | ORAL | Status: DC
  Administered 2021-04-23 – 2021-04-25 (×4): 50 mg via ORAL
  Filled 2021-04-23 (×4): qty 1

## 2021-04-23 NOTE — Progress Notes (Signed)
PROGRESS NOTE    William [name]  EAV:[id number] DOB: [birth year] DOA: 04/10/2021 PCP: Deland Pretty, MD    Brief Narrative:  [phone] with hx afib on anticoagulation, AAA s/p repair presented with hypoxemic respiratory failure, found to have B PNA on CT chest with leukocytosis. Pt was started on empiric abx. Hospitalist was consulted for consideration for admission.  Assessment & Plan:   Principal Problem:   Respiratory failure with hypoxia ([place]) Active Problems:   Essential hypertension   SOB (shortness of breath)   Severe sepsis (HCC)   CAP (community acquired pneumonia)   Elevated troponin   Thoracic aortic aneurysm (TAA) (HCC)   Paroxysmal atrial fibrillation (HCC)  Acute hypoxic respiratory failure secondary to sepsis with bilateral pneumonia present on admit -Pt is continued on 6LNC. Pt is normally O2 naive -Wean O2 as tolerated -Afebrile. Leukocytosis normalized -Will continue on vanc and rocephin -D.dimer elevated at 12.33. CT chest reviewed, neg for PE -Blood cultures are neg -Cont on flutter valve as tolerated  Elevated d.dimer -Presenting D. Dimer in excess of 12.3 -CT chest reviewed, no evidence of PE noted -Suspect elevation secondary to B pneumonia above -Will check LE dopplers -2d echo performed, pending results -Of note, pt had been on eliquis prior to admit  Mildly Elevated troponin:  -mildly elevated initial troponin of 86 at time of presentation, with repeat value demonstrating slight interval trend up to 100 -Denies chest pain this AM -Trop trended down to 85, now 94 -Suspect demand ischemia in light of pneumonia and hypoxemia -F/u on 2d echo results -Per above, CT chest neg for PE  Thoracic aortic aneurysm:  -Noted on presenting CTA chest, with maximum dimensions 4.5 x 3.9 cm.  No radiographic or clinical evidence to suggest dissection at this time.   -Radiology had recommended semiannual imaging follow-up by CTA or MRA. -Continue home  Toprol-XL.  Paroxysmal atrial fibrillation:  -Documented history of such, including a history of complication by sick sinus syndrome prompting pacemaker placement in 2011.  -CHA2DS2-VASc score of 3, continue Eliquis.  -ContinueToprol-XL.   Hypertension:  -Outpatient hypertensive regimen includes lisinopril, HCTZ, and Toprol XL.  -lisinopril and HCTZ held given presenting sepsis -continued on home Toprol XL at this time.   DVT prophylaxis: Eliquis Code Status: DNR Family Communication: Pt in room, family is at bedside  Status is: Inpatient  Remains inpatient appropriate because:Inpatient level of care appropriate due to severity of illness   Dispo: The patient is from: Home              Anticipated d/c is to: Unclear at this time              Patient currently is not medically stable to d/c.   Difficult to place patient No       Consultants:     Procedures:     Antimicrobials: Anti-infectives (From admission, onward)   Start     Dose/Rate Route Frequency Ordered Stop   04/23/21 1300  azithromycin (ZITHROMAX) 500 mg in sodium chloride 0.9 % 250 mL IVPB        500 mg 250 mL/hr over 60 Minutes Intravenous Every 24 hours 03/26/2021 1954     04/23/21 1200  cefTRIAXone (ROCEPHIN) 1 g in sodium chloride 0.9 % 100 mL IVPB        1 g 200 mL/hr over 30 Minutes Intravenous Every 24 hours 03/26/2021 1954     03/23/2021 1230  cefTRIAXone (ROCEPHIN) 1 g in sodium chloride 0.9 % 100 mL  IVPB        1 g 200 mL/hr over 30 Minutes Intravenous  Once 04/11/2021 1225 04/07/2021 1305   04/06/2021 1230  azithromycin (ZITHROMAX) 500 mg in sodium chloride 0.9 % 250 mL IVPB        500 mg 250 mL/hr over 60 Minutes Intravenous  Once 04/11/2021 1225 03/23/2021 1432       Subjective: Without complaints this AM. Eager to go home soon  Objective: Vitals:   03/24/2021 1827 04/08/2021 2237 04/23/21 0543 04/23/21 0911  BP: 120/65 111/61 128/64 110/69  Pulse: 70 67 69 80  Resp: 17 18 18 17   Temp: 98.3 F  (36.8 C) 97.6 F (36.4 C) 97.6 F (36.4 C) 98.2 F (36.8 C)  TempSrc: Oral Oral Oral Oral  SpO2: 90% 91% 91% 90%  Weight:   58.5 kg   Height:        Intake/Output Summary (Last 24 hours) at 04/23/2021 1613 Last data filed at 04/23/2021 1600 Gross per 24 hour  Intake 524.14 ml  Output 825 ml  Net -300.86 ml   Filed Weights   04/20/2021 0950 04/23/21 0543  Weight: 61.2 kg 58.5 kg    Examination: General exam: Awake, laying in bed, in nad Respiratory system: Increased resp effort, no audible wheezing Cardiovascular system: regular rate, s1, s2 Gastrointestinal system: Soft, nondistended, positive BS Central nervous system: CN2-12 grossly intact, strength intact Extremities: Perfused, no clubbing Skin: Normal skin turgor, no notable skin lesions seen Psychiatry: Mood normal // no visual hallucinations   Data Reviewed: I have personally reviewed following labs and imaging studies  CBC: Recent Labs  Lab 04/21/2021 0958 04/23/21 0434  WBC 12.8* 9.9  NEUTROABS  --  8.9*  HGB 12.4* 12.4*  HCT 36.2* 36.0*  MCV 89.6 90.0  PLT 303 762   Basic Metabolic Panel: Recent Labs  Lab 04/06/2021 0958 03/28/2021 1956 04/23/21 0434  NA 134*  --  136  K 4.1  --  3.7  CL 100  --  101  CO2 19*  --  22  GLUCOSE 160*  --  182*  BUN 32*  --  42*  CREATININE 0.98  --  1.19  CALCIUM 8.8*  --  9.1  MG  --  1.8 1.8  PHOS  --   --  3.2   GFR: Estimated Creatinine Clearance: 31.4 mL/min (by C-G formula based on SCr of 1.19 mg/dL). Liver Function Tests: Recent Labs  Lab 03/29/2021 0958 04/23/21 0434  AST 23 30  ALT 10 17  ALKPHOS 69 67  BILITOT 0.8 0.6  PROT 6.0* 6.3*  ALBUMIN 3.3* 2.6*   No results for input(s): LIPASE, AMYLASE in the last 168 hours. No results for input(s): AMMONIA in the last 168 hours. Coagulation Profile: No results for input(s): INR, PROTIME in the last 168 hours. Cardiac Enzymes: No results for input(s): CKTOTAL, CKMB, CKMBINDEX, TROPONINI in the last 168  hours. BNP (last 3 results) No results for input(s): PROBNP in the last 8760 hours. HbA1C: No results for input(s): HGBA1C in the last 72 hours. CBG: No results for input(s): GLUCAP in the last 168 hours. Lipid Profile: No results for input(s): CHOL, HDL, LDLCALC, TRIG, CHOLHDL, LDLDIRECT in the last 72 hours. Thyroid Function Tests: No results for input(s): TSH, T4TOTAL, FREET4, T3FREE, THYROIDAB in the last 72 hours. Anemia Panel: No results for input(s): VITAMINB12, FOLATE, FERRITIN, TIBC, IRON, RETICCTPCT in the last 72 hours. Sepsis Labs: Recent Labs  Lab 04/20/2021 2011 03/30/2021 2208  PROCALCITON 0.18  --  LATICACIDVEN  --  3.0*    Recent Results (from the past 240 hour(s))  Resp Panel by RT-PCR (Flu A&B, Covid) Nasopharyngeal Swab     Status: None   Collection Time: 03/28/2021 10:07 AM   Specimen: Nasopharyngeal Swab; Nasopharyngeal(NP) swabs in vial transport medium  Result Value Ref Range Status   SARS Coronavirus 2 by RT PCR NEGATIVE NEGATIVE Final    Comment: (NOTE) SARS-CoV-2 target nucleic acids are NOT DETECTED.  The SARS-CoV-2 RNA is generally detectable in upper respiratory specimens during the acute phase of infection. The lowest concentration of SARS-CoV-2 viral copies this assay can detect is 138 copies/mL. A negative result does not preclude SARS-Cov-2 infection and should not be used as the sole basis for treatment or other patient management decisions. A negative result may occur with  improper specimen collection/handling, submission of specimen other than nasopharyngeal swab, presence of viral mutation(s) within the areas targeted by this assay, and inadequate number of viral copies(<138 copies/mL). A negative result must be combined with clinical observations, patient history, and epidemiological information. The expected result is Negative.  Fact Sheet for Patients:  EntrepreneurPulse.com.au  Fact Sheet for Healthcare Providers:   IncredibleEmployment.be  This test is no t yet approved or cleared by the [place] FDA and  has been authorized for detection and/or diagnosis of SARS-CoV-2 by FDA under an Emergency Use Authorization (EUA). This EUA will remain  in effect (meaning this test can be used) for the duration of the COVID-19 declaration under Section 564(b)(1) of the Act, 21 U.S.C.section 360bbb-3(b)(1), unless the authorization is terminated  or revoked sooner.       Influenza A by PCR NEGATIVE NEGATIVE Final   Influenza B by PCR NEGATIVE NEGATIVE Final    Comment: (NOTE) The Xpert Xpress SARS-CoV-2/FLU/RSV plus assay is intended as an aid in the diagnosis of influenza from Nasopharyngeal swab specimens and should not be used as a sole basis for treatment. Nasal washings and aspirates are unacceptable for Xpert Xpress SARS-CoV-2/FLU/RSV testing.  Fact Sheet for Patients: EntrepreneurPulse.com.au  Fact Sheet for Healthcare Providers: IncredibleEmployment.be  This test is not yet approved or cleared by the [place] FDA and has been authorized for detection and/or diagnosis of SARS-CoV-2 by FDA under an Emergency Use Authorization (EUA). This EUA will remain in effect (meaning this test can be used) for the duration of the COVID-19 declaration under Section 564(b)(1) of the Act, 21 U.S.C. section 360bbb-3(b)(1), unless the authorization is terminated or revoked.  Performed at [facility], [address] [place], [place] [address]   Culture, blood (Routine X 2) w Reflex to ID Panel     Status: None (Preliminary result)   Collection Time: 03/29/2021 10:08 PM   Specimen: BLOOD RIGHT HAND  Result Value Ref Range Status   Specimen Description BLOOD RIGHT HAND  Final   Special Requests   Final    BOTTLES DRAWN AEROBIC AND ANAEROBIC Blood Culture results may not be optimal due to an excessive volume of blood received in  culture bottles   Culture   Final    NO GROWTH < 12 HOURS Performed at [facility] [address], [place], [place] [address]    Report Status PENDING  Incomplete  Culture, blood (Routine X 2) w Reflex to ID Panel     Status: None (Preliminary result)   Collection Time: 04/21/2021 10:10 PM   Specimen: BLOOD LEFT HAND  Result Value Ref Range Status   Specimen Description BLOOD LEFT HAND  Final   Special Requests   Final    BOTTLES DRAWN AEROBIC AND ANAEROBIC Blood Culture adequate volume   Culture   Final    NO GROWTH < 12 HOURS Performed at [facility], [address], [place], [place] [address]    Report Status PENDING  Incomplete     Radiology Studies: CT Angio Chest PE W and/or Wo Contrast  Result Date: 04/06/2021 CLINICAL DATA:  PE suspected, shortness of breath, cough, productive sputum EXAM: CT ANGIOGRAPHY CHEST WITH CONTRAST TECHNIQUE: Multidetector CT imaging of the chest was performed using the standard protocol during bolus administration of intravenous contrast. Multiplanar CT image reconstructions and MIPs were obtained to evaluate the vascular anatomy. CONTRAST:  79mL OMNIPAQUE IOHEXOL 350 MG/ML SOLN COMPARISON:  None. FINDINGS: Cardiovascular: Satisfactory opacification of the pulmonary arteries to the segmental level. No evidence of pulmonary embolism. Cardiomegaly. Left coronary artery calcifications. Left chest multi lead pacer. No pericardial effusion. Aortic atherosclerosis. Focal aneurysm of the proximal, left aspect of the aortic arch, maximum dimensions 4.5 x 3.9 cm (series 7, image 77, series 8, image 96). Mediastinum/Nodes: No enlarged mediastinal, hilar, or axillary lymph nodes. Thyroid gland, trachea, and esophagus demonstrate no significant findings. Lungs/Pleura: Trace bilateral pleural effusions and associated atelectasis or consolidation. Diffuse, somewhat geographic ground-glass airspace opacity throughout the lungs, with mild interlobular septal  thickening. There is some degree of underlying fibrotic interstitial opacity, poorly characterized. Upper Abdomen: No acute abnormality. Musculoskeletal: No chest wall abnormality. No acute or significant osseous findings. Review of the MIP images confirms the above findings. IMPRESSION: 1. Negative examination for pulmonary embolism. 2. Trace bilateral pleural effusions and associated atelectasis or consolidation. 3. Diffuse, somewhat geographic ground-glass airspace opacity throughout the lungs, with mild interlobular septal thickening. Findings are most consistent with infection. 4. There is some degree of underlying fibrotic interstitial opacity, poorly characterized. This could be further evaluated on a nonemergent basis by ILD protocol CT examination of the chest at the resolution of acute presentation. 5. Cardiomegaly and coronary artery disease. 6. Focal aneurysm of the proximal, left aspect of the aortic arch, maximum dimensions 4.5 x 3.9 cm. Recommend semi-annual imaging followup by CTA or MRA and referral to cardiothoracic surgery if not already obtained. This recommendation follows 2010 ACCF/AHA/AATS/ACR/ASA/SCA/SCAI/SIR/STS/SVM Guidelines for the Diagnosis and Management of Patients With Thoracic Aortic Disease. Circulation. 2010; 121: G956-O13. Aortic aneurysm NOS (ICD10-I71.9) Aortic Atherosclerosis (ICD10-I70.0). Electronically Signed   By: Eddie Candle M.D.   On: 04/14/2021 12:11   DG Chest Port 1 View  Result Date: 04/20/2021 CLINICAL DATA:  Dyspnea EXAM: PORTABLE CHEST 1 VIEW COMPARISON:  2011 FINDINGS: Bilateral interstitial opacities. No significant pleural effusion. No pneumothorax. Cardiomediastinal contours are within normal limits with normal heart size. There is atherosclerotic calcification along the thoracic aorta. Left chest wall dual lead pacemaker present. Advanced degenerative changes at the left glenohumeral joint. IMPRESSION: Bilateral interstitial opacities may reflect  atypical/viral pneumonia or edema in the appropriate setting. Electronically Signed   By: Macy Mis M.D.   On: 04/16/2021 10:18    Scheduled Meds: . apixaban  2.5 mg Oral BID  . aspirin  325 mg Oral Once  . metoprolol succinate  25 mg Oral Daily  . traMADol  50 mg Oral Q12H   Continuous Infusions: . azithromycin 500 mg (04/23/21 1514)  . cefTRIAXone (ROCEPHIN)  IV 1 g (04/23/21 1326)     LOS: 1 day   Marylu Lund, MD [facility] Pager On Amion  If 7PM-7AM, please contact night-coverage  04/23/2021, 4:13 PM

## 2021-04-23 NOTE — Plan of Care (Signed)

## 2021-04-23 NOTE — Progress Notes (Signed)
  Echocardiogram 2D Echocardiogram has been performed with [facility].  William [name] 04/23/2021, 4:17 PM

## 2021-04-23 DEATH — deceased

## 2021-04-24 ENCOUNTER — Inpatient Hospital Stay (HOSPITAL_COMMUNITY): Payer: Medicare Other

## 2021-04-24 DIAGNOSIS — E8770 Fluid overload, unspecified: Secondary | ICD-10-CM | POA: Diagnosis not present

## 2021-04-24 DIAGNOSIS — I10 Essential (primary) hypertension: Secondary | ICD-10-CM | POA: Diagnosis not present

## 2021-04-24 DIAGNOSIS — R778 Other specified abnormalities of plasma proteins: Secondary | ICD-10-CM

## 2021-04-24 DIAGNOSIS — R7989 Other specified abnormal findings of blood chemistry: Secondary | ICD-10-CM

## 2021-04-24 DIAGNOSIS — J189 Pneumonia, unspecified organism: Secondary | ICD-10-CM

## 2021-04-24 DIAGNOSIS — R0602 Shortness of breath: Secondary | ICD-10-CM

## 2021-04-24 LAB — URINALYSIS, ROUTINE W REFLEX MICROSCOPIC
Bilirubin Urine: NEGATIVE
Glucose, UA: NEGATIVE mg/dL
Ketones, ur: NEGATIVE mg/dL
Leukocytes,Ua: NEGATIVE
Nitrite: NEGATIVE
Protein, ur: 30 mg/dL — AB
RBC / HPF: >50 RBC/hpf — ABNORMAL HIGH (ref 0–5)
Specific Gravity, Urine: 1.018 (ref 1.005–1.030)
pH: 5 (ref 5.0–8.0)

## 2021-04-24 LAB — COMPREHENSIVE METABOLIC PANEL
ALT: 23 U/L (ref 0–44)
AST: 43 U/L — ABNORMAL HIGH (ref 15–41)
Albumin: 2.4 g/dL — ABNORMAL LOW (ref 3.5–5.0)
Alkaline Phosphatase: 62 U/L (ref 38–126)
Anion gap: 11 (ref 5–15)
BUN: 58 mg/dL — ABNORMAL HIGH (ref 8–23)
CO2: 23 mmol/L (ref 22–32)
Calcium: 8.9 mg/dL (ref 8.9–10.3)
Chloride: 105 mmol/L (ref 98–111)
Creatinine, Ser: 1.35 mg/dL — ABNORMAL HIGH (ref 0.61–1.24)
GFR, Estimated: 49 mL/min — ABNORMAL LOW (ref >60)
Glucose, Bld: 146 mg/dL — ABNORMAL HIGH (ref 70–99)
Potassium: 3.9 mmol/L (ref 3.5–5.1)
Sodium: 139 mmol/L (ref 135–145)
Total Bilirubin: 0.5 mg/dL (ref 0.3–1.2)
Total Protein: 5.5 g/dL — ABNORMAL LOW (ref 6.5–8.1)

## 2021-04-24 LAB — CBC
HCT: 33.6 % — ABNORMAL LOW (ref 39.0–52.0)
Hemoglobin: 11.5 g/dL — ABNORMAL LOW (ref 13.0–17.0)
MCH: 30.7 pg (ref 26.0–34.0)
MCHC: 34.2 g/dL (ref 30.0–36.0)
MCV: 89.8 fL (ref 80.0–100.0)
Platelets: 309 10*3/uL (ref 150–400)
RBC: 3.74 MIL/uL — ABNORMAL LOW (ref 4.22–5.81)
RDW: 13.3 % (ref 11.5–15.5)
WBC: 19.1 10*3/uL — ABNORMAL HIGH (ref 4.0–10.5)
nRBC: 0 % (ref 0.0–0.2)

## 2021-04-24 LAB — RESP PANEL BY RT-PCR (FLU A&B, COVID) ARPGX2
Influenza A by PCR: NEGATIVE
Influenza B by PCR: NEGATIVE
SARS Coronavirus 2 by RT PCR: NEGATIVE

## 2021-04-24 LAB — MYCOPLASMA PNEUMONIAE ANTIBODY, IGM: Mycoplasma pneumo IgM: <770 U/mL (ref 0–769)

## 2021-04-24 MED ORDER — SODIUM CHLORIDE 0.9 % IV SOLN: INTRAVENOUS | Status: DC

## 2021-04-24 MED ORDER — AYR SALINE NASAL NA GEL: 1.0000 "application " | NASAL | Status: DC

## 2021-04-24 MED ORDER — SALINE SPRAY 0.65 % NA SOLN
1.0000 | NASAL | Status: DC
  Administered 2021-04-24 – 2021-04-26 (×9): 1 via NASAL
  Filled 2021-04-24: qty 44

## 2021-04-24 NOTE — Evaluation (Signed)
Physical Therapy Evaluation Patient Details Name: William [name] MRN: [id number] DOB: June 08, 1924 Today's Date: 04/24/2021   History of Present Illness  Pt adm 5/31 with acute hypoxemic respiratory failure due to Bil PNA. PMH - HTN, thoracic aortic aneurysm, afib, pacer  Clinical Impression  Pt presents to PT with significant decr in his functional mobility due to dyspnea, weakness, and decr balance. Pt is usually very active and independent. Pt should make steady progress as he medically improves. I do think he is going to need some assist at home initially. His son is coming in town and hopefully he will be able to stay and assist with eventual transition back to home. If not may need to look at another option for rehab.     Follow Up Recommendations Home health PT;Supervision for mobility/OOB    Equipment Recommendations  Other (comment) (likely rollator)    Recommendations for Other Services       Precautions / Restrictions Precautions Precautions: Fall;Other (comment) Precaution Comments: watch SpO2      Mobility  Bed Mobility Overal bed mobility: Needs Assistance Bed Mobility: Supine to Sit     Supine to sit: Min assist;HOB elevated     General bed mobility comments: Assist to elevate trunk into sitting and bring hips to EOB    Transfers Overall transfer level: Needs assistance Equipment used: 1 person hand held assist Transfers: Sit to/from [facility] Sit to Stand: Mod assist;Min assist Stand pivot transfers: Min assist       General transfer comment: Mod assist to bring hips up and for balance on 1st stand. Min assist on 2nd stand. Bed to chair with pivotal steps with bil shelf arm support.  Ambulation/Gait                Stairs            Wheelchair Mobility    Modified Rankin (Stroke Patients Only)       Balance Overall balance assessment: Needs assistance Sitting-balance support: Bilateral upper extremity  supported;Feet supported Sitting balance-Leahy Scale: Poor Sitting balance - Comments: UE support and initially min assist. Progressed to min guard Postural control: Posterior lean Standing balance support: Single extremity supported Standing balance-Leahy Scale: Poor Standing balance comment: UE support and min assist for static standing                             Pertinent Vitals/Pain Pain Assessment: No/denies pain    Home Living Family/patient expects to be discharged to:: Private residence Living Arrangements: Alone Available Help at Discharge: Family (Son coming and can assist) Type of Home: House Home Access: Stairs to enter Entrance Stairs-Rails: Right Entrance Stairs-Number of Steps: 4 Home Layout: One level Home Equipment: Cane - single point;Grab bars - toilet;Grab bars - tub/shower      Prior Function Level of Independence: Independent with assistive device(s)         Comments: Uses cane. Drives. No falls.     Hand Dominance        Extremity/Trunk Assessment   Upper Extremity Assessment Upper Extremity Assessment: Defer to OT evaluation    Lower Extremity Assessment Lower Extremity Assessment: Generalized weakness       Communication   Communication: No difficulties  Cognition Arousal/Alertness: Awake/alert Behavior During Therapy: WFL for tasks assessed/performed Overall Cognitive Status: Within Functional Limits for tasks assessed  General Comments General comments (skin integrity, edema, etc.): Pt on 4L O2. SpO2 90% at rest and 86% with standing and transfer to chair. Instructed pt in pursed lip breathing    Exercises     Assessment/Plan    PT Assessment Patient needs continued PT services  PT Problem List Decreased strength;Decreased activity tolerance;Decreased balance;Decreased mobility;Cardiopulmonary status limiting activity       PT Treatment Interventions DME  instruction;Gait training;Stair training;Functional mobility training;Therapeutic activities;Therapeutic exercise;Balance training;Patient/family education    PT Goals (Current goals can be found in the Care Plan section)  Acute Rehab PT Goals Patient Stated Goal: get back to prior level PT Goal Formulation: With patient Time For Goal Achievement: 05/08/21 Potential to Achieve Goals: Good    Frequency Min 3X/week   Barriers to discharge Decreased caregiver support;Inaccessible home environment Lives alone. Stairs to enter    Co-evaluation               AM-PAC PT "6 Clicks" Mobility  Outcome Measure Help needed turning from your back to your side while in a flat bed without using bedrails?: A Little Help needed moving from lying on your back to sitting on the side of a flat bed without using bedrails?: A Little Help needed moving to and from a bed to a chair (including a wheelchair)?: A Little Help needed standing up from a chair using your arms (e.g., wheelchair or bedside chair)?: A Little Help needed to walk in hospital room?: Total Help needed climbing 3-5 steps with a railing? : Total 6 Click Score: 14    End of Session Equipment Utilized During Treatment: Oxygen Activity Tolerance: Treatment limited secondary to medical complications (Comment) (dyspnea and decr SpO2) Patient left: in chair;with call bell/phone within reach;with chair alarm set Nurse Communication: Mobility status PT Visit Diagnosis: Unsteadiness on feet (R26.81);Other abnormalities of gait and mobility (R26.89);Muscle weakness (generalized) (M62.81)    Time: 1610-9604 PT Time Calculation (min) (ACUTE ONLY): 20 min   Charges:   PT Evaluation $PT Eval Moderate Complexity: [place] Pager [phone] Office [place] 04/24/2021, 5:36 PM

## 2021-04-24 NOTE — Progress Notes (Addendum)
PROGRESS NOTE    William [name]  LEX:[id number] DOB: 03/25/1924 DOA: 03/30/2021 PCP: Deland Pretty, MD    Brief Narrative:  [phone] with hx afib on anticoagulation, AAA s/p repair presented with hypoxemic respiratory failure, found to have B PNA on CT chest with leukocytosis. Pt was started on empiric abx. Hospitalist was consulted for consideration for admission.  Assessment & Plan:   Principal Problem:   Respiratory failure with hypoxia ([place]) Active Problems:   Essential hypertension   SOB (shortness of breath)   Severe sepsis (HCC)   CAP (community acquired pneumonia)   Elevated troponin   Thoracic aortic aneurysm (TAA) (HCC)   Paroxysmal atrial fibrillation (HCC)  Acute hypoxic respiratory failure secondary to sepsis with bilateral pneumonia present on admit -Pt is continued on 6LNC. Pt is normally O2 naive -Remains on 6L high flow O2. Wean O2 as tolerated -Afebrile. Leukocytosis normalized -Will continue on vanc and rocephin -D.dimer elevated at 12.33. CT chest reviewed, neg for PE -Blood cultures are neg -Cont on flutter valve as tolerated -Repeat and reviewed CXR. Findings of continued B PNA. Discussed with ID. Have repeat COVID test, found to be neg. Also ordered resp viral panel, pending  Elevated d.dimer -Presenting D. Dimer in excess of 12.3 -CT chest reviewed, no evidence of PE noted -Suspect elevation secondary to B pneumonia above -BLE dopplers reviewed, neg for DVT -2d echo performed, normal LV EF, reviewed -Of note, pt had been on eliquis prior to admit  Mildly Elevated troponin:  -mildly elevated initial troponin of 86 at time of presentation, with repeat value demonstrating slight interval trend up to 100 -Denies chest pain this AM -Trop trended down to 85, now 94 -Suspect demand ischemia in light of pneumonia and hypoxemia -2d echo reviewed, normal LVEF, no wall motion abnormalities -CT chest neg for PE  Thoracic aortic aneurysm:  -Noted on  presenting CTA chest, with maximum dimensions 4.5 x 3.9 cm.  No radiographic or clinical evidence to suggest dissection at this time.   -Radiology had recommended semiannual imaging follow-up by CTA or MRA. -Continue home Toprol-XL.  Paroxysmal atrial fibrillation:  -Documented history of such, including a history of complication by sick sinus syndrome prompting pacemaker placement in 2011.  -CHA2DS2-VASc score of 3, continue Eliquis.  -ContinueToprol-XL.   Hypertension:  -Outpatient hypertensive regimen includes lisinopril, HCTZ, and Toprol XL.  -lisinopril and HCTZ initially held given presenting sepsis -continued on home Toprol XL at this time.   DVT prophylaxis: Eliquis Code Status: DNR Family Communication: Pt in room, family is at bedside  Status is: Inpatient  Remains inpatient appropriate because:Inpatient level of care appropriate due to severity of illness  Dispo: The patient is from: Home              Anticipated d/c is to: Unclear at this time              Patient currently is not medically stable to d/c.   Difficult to place patient No    Consultants:     Procedures:     Antimicrobials: Anti-infectives (From admission, onward)   Start     Dose/Rate Route Frequency Ordered Stop   04/23/21 1300  azithromycin (ZITHROMAX) 500 mg in sodium chloride 0.9 % 250 mL IVPB        500 mg 250 mL/hr over 60 Minutes Intravenous Every 24 hours 04/01/2021 1954     04/23/21 1200  cefTRIAXone (ROCEPHIN) 1 g in sodium chloride 0.9 % 100 mL IVPB  1 g 200 mL/hr over 30 Minutes Intravenous Every 24 hours 04/10/2021 1954     04/05/2021 1230  cefTRIAXone (ROCEPHIN) 1 g in sodium chloride 0.9 % 100 mL IVPB        1 g 200 mL/hr over 30 Minutes Intravenous  Once 04/21/2021 1225 03/30/2021 1305   04/03/2021 1230  azithromycin (ZITHROMAX) 500 mg in sodium chloride 0.9 % 250 mL IVPB        500 mg 250 mL/hr over 60 Minutes Intravenous  Once 04/14/2021 1225 04/02/2021 1432       Subjective: Still remains on o2, reports feeling mildly better this AM  Objective: Vitals:   04/23/21 0543 04/23/21 0911 04/23/21 1629 04/23/21 2200  BP: 128/64 110/69  (!) 117/56  Pulse: 69 80  83  Resp: 18 17  20   Temp: 97.6 F (36.4 C) 98.2 F (36.8 C)  98.1 F (36.7 C)  TempSrc: Oral Oral  Oral  SpO2: 91% 90% 92% 91%  Weight: 58.5 kg     Height:        Intake/Output Summary (Last 24 hours) at 04/24/2021 1425 Last data filed at 04/24/2021 1100 Gross per 24 hour  Intake 744.37 ml  Output 280 ml  Net 464.37 ml   Filed Weights   04/21/2021 0950 04/23/21 0543  Weight: 61.2 kg 58.5 kg    Examination: General exam: Awake, laying in bed, in nad Respiratory system: Normal respiratory effort, no wheezing Cardiovascular system: regular rate, s1, s2 Gastrointestinal system: Soft, nondistended, positive BS Central nervous system: CN2-12 grossly intact, strength intact Extremities: Perfused, no clubbing Skin: Normal skin turgor, no notable skin lesions seen Psychiatry: Mood normal // no visual hallucinations   Data Reviewed: I have personally reviewed following labs and imaging studies  CBC: Recent Labs  Lab 04/10/2021 0958 04/23/21 0434 04/24/21 0155  WBC 12.8* 9.9 19.1*  NEUTROABS  --  8.9*  --   HGB 12.4* 12.4* 11.5*  HCT 36.2* 36.0* 33.6*  MCV 89.6 90.0 89.8  PLT 303 266 185   Basic Metabolic Panel: Recent Labs  Lab 03/31/2021 0958 04/07/2021 1956 04/23/21 0434 04/24/21 0155  NA 134*  --  136 139  K 4.1  --  3.7 3.9  CL 100  --  101 105  CO2 19*  --  22 23  GLUCOSE 160*  --  182* 146*  BUN 32*  --  42* 58*  CREATININE 0.98  --  1.19 1.35*  CALCIUM 8.8*  --  9.1 8.9  MG  --  1.8 1.8  --   PHOS  --   --  3.2  --    GFR: Estimated Creatinine Clearance: 27.7 mL/min (A) (by C-G formula based on SCr of 1.35 mg/dL (H)). Liver Function Tests: Recent Labs  Lab 04/14/2021 0958 04/23/21 0434 04/24/21 0155  AST 23 30 43*  ALT 10 17 23   ALKPHOS 69 67 62   BILITOT 0.8 0.6 0.5  PROT 6.0* 6.3* 5.5*  ALBUMIN 3.3* 2.6* 2.4*   No results for input(s): LIPASE, AMYLASE in the last 168 hours. No results for input(s): AMMONIA in the last 168 hours. Coagulation Profile: No results for input(s): INR, PROTIME in the last 168 hours. Cardiac Enzymes: No results for input(s): CKTOTAL, CKMB, CKMBINDEX, TROPONINI in the last 168 hours. BNP (last 3 results) No results for input(s): PROBNP in the last 8760 hours. HbA1C: No results for input(s): HGBA1C in the last 72 hours. CBG: No results for input(s): GLUCAP in the last 168 hours.  Lipid Profile: No results for input(s): CHOL, HDL, LDLCALC, TRIG, CHOLHDL, LDLDIRECT in the last 72 hours. Thyroid Function Tests: No results for input(s): TSH, T4TOTAL, FREET4, T3FREE, THYROIDAB in the last 72 hours. Anemia Panel: No results for input(s): VITAMINB12, FOLATE, FERRITIN, TIBC, IRON, RETICCTPCT in the last 72 hours. Sepsis Labs: Recent Labs  Lab 04/01/2021 2011 03/25/2021 2208  PROCALCITON 0.18  --   LATICACIDVEN  --  3.0*    Recent Results (from the past 240 hour(s))  Resp Panel by RT-PCR (Flu A&B, Covid) Nasopharyngeal Swab     Status: None   Collection Time: 04/21/2021 10:07 AM   Specimen: Nasopharyngeal Swab; Nasopharyngeal(NP) swabs in vial transport medium  Result Value Ref Range Status   SARS Coronavirus 2 by RT PCR NEGATIVE NEGATIVE Final    Comment: (NOTE) SARS-CoV-2 target nucleic acids are NOT DETECTED.  The SARS-CoV-2 RNA is generally detectable in upper respiratory specimens during the acute phase of infection. The lowest concentration of SARS-CoV-2 viral copies this assay can detect is 138 copies/mL. A negative result does not preclude SARS-Cov-2 infection and should not be used as the sole basis for treatment or other patient management decisions. A negative result may occur with  improper specimen collection/handling, submission of specimen other than nasopharyngeal swab, presence of  viral mutation(s) within the areas targeted by this assay, and inadequate number of viral copies(<138 copies/mL). A negative result must be combined with clinical observations, patient history, and epidemiological information. The expected result is Negative.  Fact Sheet for Patients:  EntrepreneurPulse.com.au  Fact Sheet for Healthcare Providers:  IncredibleEmployment.be  This test is no t yet approved or cleared by the [place] FDA and  has been authorized for detection and/or diagnosis of SARS-CoV-2 by FDA under an Emergency Use Authorization (EUA). This EUA will remain  in effect (meaning this test can be used) for the duration of the COVID-19 declaration under Section 564(b)(1) of the Act, 21 U.S.C.section 360bbb-3(b)(1), unless the authorization is terminated  or revoked sooner.       Influenza A by PCR NEGATIVE NEGATIVE Final   Influenza B by PCR NEGATIVE NEGATIVE Final    Comment: (NOTE) The Xpert Xpress SARS-CoV-2/FLU/RSV plus assay is intended as an aid in the diagnosis of influenza from Nasopharyngeal swab specimens and should not be used as a sole basis for treatment. Nasal washings and aspirates are unacceptable for Xpert Xpress SARS-CoV-2/FLU/RSV testing.  Fact Sheet for Patients: EntrepreneurPulse.com.au  Fact Sheet for Healthcare Providers: IncredibleEmployment.be  This test is not yet approved or cleared by the [place] FDA and has been authorized for detection and/or diagnosis of SARS-CoV-2 by FDA under an Emergency Use Authorization (EUA). This EUA will remain in effect (meaning this test can be used) for the duration of the COVID-19 declaration under Section 564(b)(1) of the Act, 21 U.S.C. section 360bbb-3(b)(1), unless the authorization is terminated or revoked.  Performed at [facility], [address] [place], [place] [address]   Culture, blood  (Routine X 2) w Reflex to ID Panel     Status: None (Preliminary result)   Collection Time: 03/27/2021 10:08 PM   Specimen: BLOOD RIGHT HAND  Result Value Ref Range Status   Specimen Description BLOOD RIGHT HAND  Final   Special Requests   Final    BOTTLES DRAWN AEROBIC AND ANAEROBIC Blood Culture results may not be optimal due to an excessive volume of blood received in culture bottles   Culture   Final    NO GROWTH 2  DAYS Performed at [facility], [place] [address], [place], [place] [address]    Report Status PENDING  Incomplete  Culture, blood (Routine X 2) w Reflex to ID Panel     Status: None (Preliminary result)   Collection Time: 04/19/2021 10:10 PM   Specimen: BLOOD LEFT HAND  Result Value Ref Range Status   Specimen Description BLOOD LEFT HAND  Final   Special Requests   Final    BOTTLES DRAWN AEROBIC AND ANAEROBIC Blood Culture adequate volume   Culture   Final    NO GROWTH 2 DAYS Performed at [facility], [place] [address] [place], [place] [address]    Report Status PENDING  Incomplete  Resp Panel by RT-PCR (Flu A&B, Covid) Nasopharyngeal Swab     Status: None   Collection Time: 04/24/21 11:13 AM   Specimen: Nasopharyngeal Swab; Nasopharyngeal(NP) swabs in vial transport medium  Result Value Ref Range Status   SARS Coronavirus 2 by RT PCR NEGATIVE NEGATIVE Final    Comment: (NOTE) SARS-CoV-2 target nucleic acids are NOT DETECTED.  The SARS-CoV-2 RNA is generally detectable in upper respiratory specimens during the acute phase of infection. The lowest concentration of SARS-CoV-2 viral copies this assay can detect is 138 copies/mL. A negative result does not preclude SARS-Cov-2 infection and should not be used as the sole basis for treatment or other patient management decisions. A negative result may occur with  improper specimen collection/handling, submission of specimen other than nasopharyngeal swab, presence of viral mutation(s) within the areas targeted by  this assay, and inadequate number of viral copies(<138 copies/mL). A negative result must be combined with clinical observations, patient history, and epidemiological information. The expected result is Negative.  Fact Sheet for Patients:  EntrepreneurPulse.com.au  Fact Sheet for Healthcare Providers:  IncredibleEmployment.be  This test is no t yet approved or cleared by the [place] FDA and  has been authorized for detection and/or diagnosis of SARS-CoV-2 by FDA under an Emergency Use Authorization (EUA). This EUA will remain  in effect (meaning this test can be used) for the duration of the COVID-19 declaration under Section 564(b)(1) of the Act, 21 U.S.C.section 360bbb-3(b)(1), unless the authorization is terminated  or revoked sooner.       Influenza A by PCR NEGATIVE NEGATIVE Final   Influenza B by PCR NEGATIVE NEGATIVE Final    Comment: (NOTE) The Xpert Xpress SARS-CoV-2/FLU/RSV plus assay is intended as an aid in the diagnosis of influenza from Nasopharyngeal swab specimens and should not be used as a sole basis for treatment. Nasal washings and aspirates are unacceptable for Xpert Xpress SARS-CoV-2/FLU/RSV testing.  Fact Sheet for Patients: EntrepreneurPulse.com.au  Fact Sheet for Healthcare Providers: IncredibleEmployment.be  This test is not yet approved or cleared by the [place] FDA and has been authorized for detection and/or diagnosis of SARS-CoV-2 by FDA under an Emergency Use Authorization (EUA). This EUA will remain in effect (meaning this test can be used) for the duration of the COVID-19 declaration under Section 564(b)(1) of the Act, 21 U.S.C. section 360bbb-3(b)(1), unless the authorization is terminated or revoked.  Performed at [facility], [place] [address], [place], [place] [address]      Radiology Studies: DG CHEST PORT 1 VIEW  Result Date:  04/24/2021 CLINICAL DATA:  Pneumonia EXAM: PORTABLE CHEST 1 VIEW COMPARISON:  Apr 22, 2021 chest radiograph and chest CT FINDINGS: Airspace opacity remains throughout much of the right lung as well as in a more patchy distribution  on the left. There are scattered areas of scarring with apparent degree of underlying fibrosis. Heart is mildly enlarged with pulmonary vascularity within normal limits. Pacemaker leads are attached the right atrium and right ventricle. No adenopathy. There is aortic atherosclerosis. There is arthropathy in each shoulder. IMPRESSION: Multifocal airspace opacity persists bilaterally, more severe on the right than on the left. Suspect multifocal pneumonia superimposed on a degree of underlying scarring/fibrosis. No appreciable consolidation. Atypical organism pneumonia could present in this manner. Check of COVID-19 status advised. Stable cardiac silhouette. Pacemaker leads attached to right atrium and right ventricle. Aortic Atherosclerosis (ICD10-I70.0). Electronically Signed   By: Lowella Grip III M.D.   On: 04/24/2021 08:21   ECHOCARDIOGRAM COMPLETE  Result Date: 04/23/2021    ECHOCARDIOGRAM REPORT   Patient Name:   William [name] Date of Exam: 04/23/2021 Medical Rec #:  [id number]       Height:       68.0 in Accession #:    [id number]      Weight:       129.0 lb Date of Birth:  20-Sep-1924        BSA:          1.696 m Patient Age:    87 years        BP:           122/64 mmHg Patient Gender: M               HR:           87 bpm. Exam Location:  Inpatient Procedure: 2D Echo, Cardiac Doppler, Color Doppler and Intracardiac            Opacification Agent Indications:    Elevated troponin  History:        Patient has prior history of Echocardiogram examinations, most                 recent 10/23/2011. Pacemaker, Arrythmias:Atrial Fibrillation,                 Signs/Symptoms:Shortness of Breath; Risk Factors:Hypertension                 and Former Smoker. Hx sick sinus syndrome. AAA repair  (2009).  Sonographer:    Clayton Lefort RDCS (AE) Referring Phys: [id number] Rhetta Mura  Sonographer Comments: Suboptimal apical window and suboptimal subcostal window. Patient unable to move in LLD position due to unrepair torn rotator cuff. IMPRESSIONS  1. Left ventricular ejection fraction, by estimation, is 60 to 65%. The left ventricle has normal function. The left ventricle has no regional wall motion abnormalities. There is mild left ventricular hypertrophy. Left ventricular diastolic parameters are indeterminate.  2. Right ventricular systolic function is mildly reduced. The right ventricular size is normal. A device lead is visualized in the RA and RV, and appears highly mobile at the level of the tricuspid valve. There is normal pulmonary artery systolic pressure. The estimated right ventricular systolic pressure is 30.8 mmHg.  3. Left atrial size was mildly dilated.  4. The mitral valve is normal in structure. Mild mitral valve regurgitation. No evidence of mitral stenosis.  5. The aortic valve is abnormal. There is moderate calcification of the aortic valve. Aortic valve regurgitation is not visualized. No aortic stenosis is present.  6. The inferior vena cava is normal in size with greater than 50% respiratory variability, suggesting right atrial pressure of 3 mmHg. FINDINGS  Left Ventricle: Left ventricular ejection fraction, by estimation, is 60  to 65%. The left ventricle has normal function. The left ventricle has no regional wall motion abnormalities. Definity contrast agent was given IV to delineate the left ventricular  endocardial borders. The left ventricular internal cavity size was normal in size. There is mild left ventricular hypertrophy. Left ventricular diastolic parameters are indeterminate. Right Ventricle: The right ventricular size is normal. No increase in right ventricular wall thickness. Right ventricular systolic function is mildly reduced. There is normal pulmonary artery systolic  pressure. The tricuspid regurgitant velocity is 2.79 m/s, and with an assumed right atrial pressure of 3 mmHg, the estimated right ventricular systolic pressure is 78.5 mmHg. Left Atrium: Left atrial size was mildly dilated. Right Atrium: Right atrial size was normal in size. Pericardium: There is no evidence of pericardial effusion. Mitral Valve: The mitral valve is normal in structure. Mild mitral valve regurgitation. No evidence of mitral valve stenosis. Tricuspid Valve: The tricuspid valve is normal in structure. Tricuspid valve regurgitation is mild . No evidence of tricuspid stenosis. Aortic Valve: The aortic valve is abnormal. There is moderate calcification of the aortic valve. Aortic valve regurgitation is not visualized. No aortic stenosis is present. Aortic valve mean gradient measures 3.0 mmHg. Aortic valve peak gradient measures 5.3 mmHg. Aortic valve area, by VTI measures 1.67 cm. Pulmonic Valve: The pulmonic valve was normal in structure. Pulmonic valve regurgitation is not visualized. No evidence of pulmonic stenosis. Aorta: The aortic root is normal in size and structure. Venous: The inferior vena cava is normal in size with greater than 50% respiratory variability, suggesting right atrial pressure of 3 mmHg. IAS/Shunts: No atrial level shunt detected by color flow Doppler. Additional Comments: A device lead is visualized in the and appears highly mobile at the level of the tricuspid valve, right atrium and right ventricle.  LEFT VENTRICLE PLAX 2D LVIDd:         4.00 cm LVIDs:         2.70 cm LV PW:         1.30 cm LV IVS:        1.10 cm LVOT diam:     2.30 cm LV SV:         38 LV SV Index:   23 LVOT Area:     4.15 cm  RIGHT VENTRICLE RV S prime:     14.00 cm/s TAPSE (M-mode): 2.5 cm LEFT ATRIUM           Index LA diam:      3.80 cm 2.24 cm/m LA Vol (A2C): 16.0 ml 9.43 ml/m LA Vol (A4C): 70.0 ml 41.27 ml/m  AORTIC VALVE AV Area (Vmax):    1.95 cm AV Area (Vmean):   1.96 cm AV Area (VTI):      1.67 cm AV Vmax:           115.20 cm/s AV Vmean:          77.620 cm/s AV VTI:            0.229 m AV Peak Grad:      5.3 mmHg AV Mean Grad:      3.0 mmHg LVOT Vmax:         54.06 cm/s LVOT Vmean:        36.560 cm/s LVOT VTI:          0.092 m LVOT/AV VTI ratio: 0.40  AORTA Ao Root diam: 3.60 cm Ao Asc diam:  2.80 cm TRICUSPID VALVE TR Peak grad:   31.1 mmHg TR Vmax:  279.00 cm/s  SHUNTS Systemic VTI:  0.09 m Systemic Diam: 2.30 cm Cherlynn Kaiser MD Electronically signed by Cherlynn Kaiser MD Signature Date/Time: 04/23/2021/11:27:46 PM    Final    VAS [place] LOWER EXTREMITY VENOUS (DVT)  Result Date: 04/24/2021  Lower Venous DVT Study Patient Name:  William [name]  Date of Exam:   04/24/2021 Medical Rec #: [id number]        Accession #:    [id number] Date of Birth: February 06, 1924         Patient Gender: M Patient Age:   48Y Exam Location[facility] Procedure:      VAS [place] LOWER EXTREMITY VENOUS (DVT) Referring Phys: Coco --------------------------------------------------------------------------------  Indications: SOB, and Elevated D-Dimer, pneumonia.  Comparison Study: No prior study on file Performing Technologist: Sharion Dove RVS  Examination Guidelines: A complete evaluation includes B-mode imaging, spectral Doppler, color Doppler, and power Doppler as needed of all accessible portions of each vessel. Bilateral testing is considered an integral part of a complete examination. Limited examinations for reoccurring indications may be performed as noted. The reflux portion of the exam is performed with the patient in reverse Trendelenburg.  +---------+---------------+---------+-----------+----------+--------------+ RIGHT    CompressibilityPhasicitySpontaneityPropertiesThrombus Aging +---------+---------------+---------+-----------+----------+--------------+ CFV      Full           Yes      Yes                                  +---------+---------------+---------+-----------+----------+--------------+ SFJ      Full                                                        +---------+---------------+---------+-----------+----------+--------------+ FV Prox  Full                                                        +---------+---------------+---------+-----------+----------+--------------+ FV Mid   Full                                                        +---------+---------------+---------+-----------+----------+--------------+ FV DistalFull                                                        +---------+---------------+---------+-----------+----------+--------------+ PFV      Full                                                        +---------+---------------+---------+-----------+----------+--------------+ POP      Full           Yes      Yes                                 +---------+---------------+---------+-----------+----------+--------------+  PTV      Full                                                        +---------+---------------+---------+-----------+----------+--------------+ PERO     Full                                                        +---------+---------------+---------+-----------+----------+--------------+   +---------+---------------+---------+-----------+----------+--------------+ LEFT     CompressibilityPhasicitySpontaneityPropertiesThrombus Aging +---------+---------------+---------+-----------+----------+--------------+ CFV      Full           Yes      Yes                                 +---------+---------------+---------+-----------+----------+--------------+ SFJ      Full                                                        +---------+---------------+---------+-----------+----------+--------------+ FV Prox  Full                                                         +---------+---------------+---------+-----------+----------+--------------+ FV Mid   Full                                                        +---------+---------------+---------+-----------+----------+--------------+ FV DistalFull                                                        +---------+---------------+---------+-----------+----------+--------------+ PFV      Full                                                        +---------+---------------+---------+-----------+----------+--------------+ POP      Full           Yes      Yes                                 +---------+---------------+---------+-----------+----------+--------------+ PTV      Full                                                        +---------+---------------+---------+-----------+----------+--------------+  PERO     Full                                                        +---------+---------------+---------+-----------+----------+--------------+     Summary: BILATERAL: - No evidence of deep vein thrombosis seen in the lower extremities, bilaterally. -   *See table(s) above for measurements and observations.    Preliminary     Scheduled Meds: . apixaban  2.5 mg Oral BID  . aspirin  325 mg Oral Once  . metoprolol succinate  25 mg Oral Daily  . saline  1 application Each Nare V3K  . traMADol  50 mg Oral Q12H   Continuous Infusions: . sodium chloride    . azithromycin 250 mL/hr at 04/24/21 1100  . cefTRIAXone (ROCEPHIN)  IV 1 g (04/24/21 1318)     LOS: 2 days   Marylu Lund, MD [facility] Pager On Amion  If 7PM-7AM, please contact night-coverage 04/24/2021, 2:25 PM

## 2021-04-25 ENCOUNTER — Inpatient Hospital Stay (HOSPITAL_COMMUNITY): Payer: Medicare Other

## 2021-04-25 DIAGNOSIS — R778 Other specified abnormalities of plasma proteins: Secondary | ICD-10-CM | POA: Diagnosis not present

## 2021-04-25 DIAGNOSIS — I10 Essential (primary) hypertension: Secondary | ICD-10-CM | POA: Diagnosis not present

## 2021-04-25 DIAGNOSIS — I6389 Other cerebral infarction: Secondary | ICD-10-CM

## 2021-04-25 LAB — BLOOD GAS, ARTERIAL
Acid-base deficit: 1.5 mmol/L (ref 0.0–2.0)
Bicarbonate: 22.2 mmol/L (ref 20.0–28.0)
FIO2: 100
O2 Saturation: 93 %
Patient temperature: 36.8
pCO2 arterial: 34.2 mmHg (ref 32.0–48.0)
pH, Arterial: 7.428 (ref 7.350–7.450)
pO2, Arterial: 70 mmHg — ABNORMAL LOW (ref 83.0–108.0)

## 2021-04-25 LAB — CBC
HCT: 34.1 % — ABNORMAL LOW (ref 39.0–52.0)
Hemoglobin: 11.7 g/dL — ABNORMAL LOW (ref 13.0–17.0)
MCH: 31 pg (ref 26.0–34.0)
MCHC: 34.3 g/dL (ref 30.0–36.0)
MCV: 90.2 fL (ref 80.0–100.0)
Platelets: 204 10*3/uL (ref 150–400)
RBC: 3.78 MIL/uL — ABNORMAL LOW (ref 4.22–5.81)
RDW: 13.4 % (ref 11.5–15.5)
WBC: 13.2 10*3/uL — ABNORMAL HIGH (ref 4.0–10.5)
nRBC: 0 % (ref 0.0–0.2)

## 2021-04-25 LAB — COMPREHENSIVE METABOLIC PANEL
ALT: 24 U/L (ref 0–44)
AST: 34 U/L (ref 15–41)
Albumin: 2.3 g/dL — ABNORMAL LOW (ref 3.5–5.0)
Alkaline Phosphatase: 73 U/L (ref 38–126)
Anion gap: 8 (ref 5–15)
BUN: 40 mg/dL — ABNORMAL HIGH (ref 8–23)
CO2: 23 mmol/L (ref 22–32)
Calcium: 8.5 mg/dL — ABNORMAL LOW (ref 8.9–10.3)
Chloride: 109 mmol/L (ref 98–111)
Creatinine, Ser: 1.02 mg/dL (ref 0.61–1.24)
GFR, Estimated: >60 mL/min (ref >60)
Glucose, Bld: 153 mg/dL — ABNORMAL HIGH (ref 70–99)
Potassium: 4 mmol/L (ref 3.5–5.1)
Sodium: 140 mmol/L (ref 135–145)
Total Bilirubin: 0.7 mg/dL (ref 0.3–1.2)
Total Protein: 5.1 g/dL — ABNORMAL LOW (ref 6.5–8.1)

## 2021-04-25 LAB — TROPONIN I (HIGH SENSITIVITY)
Troponin I (High Sensitivity): 1826 ng/L (ref <18)
Troponin I (High Sensitivity): 2105 ng/L (ref <18)

## 2021-04-25 LAB — BRAIN NATRIURETIC PEPTIDE: B Natriuretic Peptide: 692.1 pg/mL — ABNORMAL HIGH (ref 0.0–100.0)

## 2021-04-25 LAB — GLUCOSE, CAPILLARY
Glucose-Capillary: 233 mg/dL — ABNORMAL HIGH (ref 70–99)
Glucose-Capillary: 167 mg/dL — ABNORMAL HIGH (ref 70–99)

## 2021-04-25 LAB — MRSA PCR SCREENING: MRSA by PCR: NEGATIVE

## 2021-04-25 MED ORDER — ASPIRIN 325 MG PO TABS: 325.0000 mg | ORAL_TABLET | Freq: Every day | ORAL | Status: DC

## 2021-04-25 MED ORDER — SODIUM CHLORIDE 0.9 % IV SOLN
2.0000 g | INTRAVENOUS | Status: DC
  Administered 2021-04-25: 1 g via INTRAVENOUS
  Filled 2021-04-25 (×2): qty 20

## 2021-04-25 MED ORDER — SODIUM CHLORIDE 0.9 % IV SOLN: INTRAVENOUS | Status: DC

## 2021-04-25 MED ORDER — FUROSEMIDE 10 MG/ML IJ SOLN
60.0000 mg | INTRAMUSCULAR | Status: AC
  Administered 2021-04-25: 60 mg via INTRAVENOUS
  Filled 2021-04-25: qty 6

## 2021-04-25 MED ORDER — MORPHINE SULFATE (PF) 2 MG/ML IV SOLN
1.0000 mg | INTRAVENOUS | Status: DC | PRN
Start: 2021-04-25 — End: 2021-04-26
  Administered 2021-04-25 – 2021-04-26 (×5): 1 mg via INTRAVENOUS
  Filled 2021-04-25 (×5): qty 1

## 2021-04-25 MED ORDER — HYDROCODONE-ACETAMINOPHEN 7.5-325 MG PO TABS: 1.0000 | ORAL_TABLET | Freq: Four times a day (QID) | ORAL | Status: DC | PRN

## 2021-04-25 MED ORDER — CHLORHEXIDINE GLUCONATE CLOTH 2 % EX PADS
6.0000 | MEDICATED_PAD | Freq: Every day | CUTANEOUS | Status: DC
  Administered 2021-04-25: 6 via TOPICAL

## 2021-04-25 MED ORDER — AZITHROMYCIN 250 MG PO TABS
500.0000 mg | ORAL_TABLET | Freq: Every day | ORAL | Status: DC
  Administered 2021-04-25: 500 mg via ORAL
  Filled 2021-04-25: qty 2

## 2021-04-25 MED ORDER — FUROSEMIDE 10 MG/ML IJ SOLN
40.0000 mg | Freq: Every day | INTRAMUSCULAR | Status: DC
  Administered 2021-04-25 – 2021-04-26 (×2): 40 mg via INTRAVENOUS
  Filled 2021-04-25 (×2): qty 4

## 2021-04-25 MED ORDER — IOHEXOL 350 MG/ML SOLN
75.0000 mL | Freq: Once | INTRAVENOUS | Status: AC | PRN
  Administered 2021-04-25: 75 mL via INTRAVENOUS

## 2021-04-25 NOTE — Evaluation (Signed)
Occupational Therapy Evaluation Patient Details Name: William [name] MRN: [id number] DOB: 1924/06/01 Today's Date: 04/25/2021    History of Present Illness Pt adm 5/31 with acute hypoxemic respiratory failure due to Bil PNA. PMH - HTN, thoracic aortic aneurysm, afib, pacer   Clinical Impression   PTA patient was living alone in a private residence and was grossly I with ADLs/IADLs with occasional use of SPC. Patient currently functioning below baseline demonstrating UB ADLs with Max A and inability to utilize LUE and LB ADLs and ADL transfers with +2 assist. Patient also limited by deficits listed below including generalized weakness RUE<LUE with known Hx of rotator cuff injury, decreased cognition, and decreased sitting/standing balance and would benefit from continued acute OT services in prep for safe d/c to next level of care. If family is able to provide 24hr supervision/assist, recommendation for return home with [place]. If family unable, recommendation for SNF rehab.    Noted patient with inability to move LUE or maintain position of LUE against gravity. UE weakness RUE<LUE. RN and MD notified. Patient with Hx of rotator cuff injury at baseline. Notes pain with active/passive movement at shoulder joint. Sensation intact. BLE with generalized weakness but symmetrical bilaterally. Please refer below for additional details.     Follow Up Recommendations  SNF;Home health OT;Supervision/Assistance - 24 hour    Equipment Recommendations  Other (comment) (TBD)    Recommendations for Other Services       Precautions / Restrictions Precautions Precautions: Fall;Other (comment) Precaution Comments: watch SpO2 Restrictions Weight Bearing Restrictions: Yes Other Position/Activity Restrictions: Presumed injury to L shoulder. RN and MD notified.      Mobility Bed Mobility Overal bed mobility: Needs Assistance Bed Mobility: Supine to Sit     Supine to sit: HOB elevated;Max assist      General bed mobility comments: Max A at BLE and trunk. Increased time/effort. Unable to use LUE to assist.    Transfers Overall transfer level: Needs assistance Equipment used: None Transfers: Squat Pivot Transfers     Squat pivot transfers: Max assist     General transfer comment: Patient unable to stand with 1 person assist. Max A for squat-pivot transfer to recliner on R with cues for hand placement.    Balance Overall balance assessment: Needs assistance Sitting-balance support: Bilateral upper extremity supported;Feet supported Sitting balance-Leahy Scale: Poor Sitting balance - Comments: Requires Mod A at best and Max A at most to maintain static sitting balance at EOB. Postural control: Right lateral lean;Posterior lean Standing balance support: Single extremity supported Standing balance-Leahy Scale: Poor Standing balance comment: Unable to use LUE to maintain static sitting balance at EOB. R lateral lean when seated in recliner. Pillows placed to position trunk in midline.                           ADL either performed or assessed with clinical judgement   ADL Overall ADL's : Needs assistance/impaired                 Upper Body Dressing : Maximal assistance;Sitting Upper Body Dressing Details (indicate cue type and reason): Max A to don anterior hosptial gown seated EOB. Unable to lift LUE to thread requiring hand over hand assist. Lower Body Dressing: Total assistance;Bed level   Toilet Transfer: Maximal assistance;Squat-pivot Toilet Transfer Details (indicate cue type and reason): Simulated with squat-pivot transfer to recliner with Max A and cues for hand placement.  General ADL Comments: Patient greatly limited by severe generalized weakness, decreased cognition and suspected L shoudler injury. RN and MD notified.     Vision Baseline Vision/History: Wears glasses Wears Glasses: At all times Patient Visual Report: No change from  baseline Vision Assessment?: No apparent visual deficits     Perception     Praxis      Pertinent Vitals/Pain Pain Assessment: Faces Faces Pain Scale: Hurts even more Pain Location: With active and passive movement at L shoulder Pain Descriptors / Indicators: Sharp;Grimacing Pain Intervention(s): Limited activity within patient's tolerance;Monitored during session;Repositioned;Other (comment) (RN notified.)     Hand Dominance Right   Extremity/Trunk Assessment Upper Extremity Assessment Upper Extremity Assessment: RUE deficits/detail;LUE deficits/detail RUE Deficits / Details: Generalized weakness. Unable to grasp cup and bring to mouth or spear food items on breakfast tray and bring to mouth without hand over hand assist. PROM WFL. RUE Sensation: WNL RUE Coordination: decreased gross motor;decreased fine motor LUE Deficits / Details: Unable to lift LUE off bed surface or use LUE to maintain static sitting balance at EOB. MMT 1/5 at shoulder and wrist and 2-/5 at elbow. 3+/5 gross grasp. LUE Sensation: WNL LUE Coordination: decreased fine motor;decreased gross motor   Lower Extremity Assessment Lower Extremity Assessment: Generalized weakness (Symmetrical bilaterally)   Cervical / Trunk Assessment Cervical / Trunk Assessment: Kyphotic   Communication Communication Communication: No difficulties   Cognition Arousal/Alertness: Awake/alert Behavior During Therapy: WFL for tasks assessed/performed Overall Cognitive Status: Impaired/Different from baseline Area of Impairment: Orientation;Memory                 Orientation Level: Time   Memory: Decreased short-term memory         General Comments: Patient states "1922" when asked current year. Unable to recall month/day. Did not attempt to self-correct. Patient with conflicting recall of events leading to inability to move L arm. Initially states weakness onset several days ago but when asked by RN later states  weakness began last Tuesday.   General Comments  Upon entry patient with [place] misaligned and SpO2 72% on RA. SpO2 increased to 91% after 90 sec and 94% after 3-4 min on 15L HFNC. Several minutes after conclusion of OT session, SpO2 98% on 15L HFNC. HR stable throughout.    Exercises     Shoulder Instructions      Home Living Family/patient expects to be discharged to:: Private residence Living Arrangements: Alone Available Help at Discharge: Family;Available PRN/intermittently (Son coming from [place].) Type of Home: House Home Access: Stairs to enter Technical brewer of Steps: 4 Entrance Stairs-Rails: Right Home Layout: One level     Bathroom Shower/Tub: Occupational psychologist: Standard     Home Equipment: Cane - single point;Grab bars - toilet;Grab bars - tub/shower          Prior Functioning/Environment Level of Independence: Independent with assistive device(s)        Comments: Uses cane. Drives. No falls.        OT Problem List: Decreased strength;Decreased range of motion;Decreased activity tolerance;Impaired balance (sitting and/or standing);Decreased coordination;Decreased cognition;Decreased safety awareness;Decreased knowledge of use of DME or AE;Decreased knowledge of precautions;Cardiopulmonary status limiting activity;Impaired UE functional use;Pain      OT Treatment/Interventions: Self-care/ADL training;Therapeutic exercise;Energy conservation;DME and/or AE instruction;Therapeutic activities;Cognitive remediation/compensation;Patient/family education;Balance training    OT Goals(Current goals can be found in the care plan section) Acute Rehab OT Goals Patient Stated Goal: To be able to feed himself. OT Goal Formulation: With patient Time  For Goal Achievement: 05/09/21 Potential to Achieve Goals: Fair  OT Frequency: Min 2X/week   Barriers to D/C: Decreased caregiver support  Lives alone       Co-evaluation              AM-PAC  OT "6 Clicks" Daily Activity     Outcome Measure Help from another person eating meals?: A Lot Help from another person taking care of personal grooming?: A Lot Help from another person toileting, which includes using toliet, bedpan, or urinal?: Total Help from another person bathing (including washing, rinsing, drying)?: Total Help from another person to put on and taking off regular upper body clothing?: A Lot Help from another person to put on and taking off regular lower body clothing?: Total 6 Click Score: 9   End of Session Equipment Utilized During Treatment: Gait belt;Oxygen Nurse Communication: Mobility status;Other (comment) (Suspected injury to L shoulder joint)  Activity Tolerance: Patient limited by lethargy;Patient limited by fatigue Patient left: in chair;with call bell/phone within reach;with chair alarm set  OT Visit Diagnosis: Unsteadiness on feet (R26.81);Muscle weakness (generalized) (M62.81);Other abnormalities of gait and mobility (R26.89);Pain Pain - Right/Left: Left Pain - part of body: Shoulder                Time: 4034-7425 OT Time Calculation (min): 39 min Charges:  OT General Charges $OT Visit: 1 Visit OT Evaluation $OT Eval Moderate Complexity: 1 Mod OT Treatments $Self Care/Home Management : 8-22 mins  Ervine Witucki H. OTR/L Supplemental OT, Department of rehab services [phone]  Takashi Korol R H. 04/25/2021, 9:14 AM

## 2021-04-25 NOTE — Progress Notes (Signed)
RT note. RT called to patient bedside per RN, patient desat to 75%. On arrival paient sat 83 on 6L , Patient placed on 15L salter and sat 90%. ABG will be obtain, RT will continue to monitor

## 2021-04-25 NOTE — Progress Notes (Signed)
Informed of MRI for today.   Device system confirmed to be MRI conditional, with implant date > 6 weeks ago and no evidence of abandoned or epicardial leads in review of most recent CXR Interrogation from today reviewed, pt is currently AS-VP at ~80 bpm Change device settings for MRI to DOO at 90 bpm  Tachy-therapies to off if applicable.  Program device back to pre-MRI settings after completion of exam.  Shirley Friar, PA-C  04/25/2021 1:19 PM

## 2021-04-25 NOTE — Evaluation (Signed)
Clinical/Bedside Swallow Evaluation Patient Details  Name: William [name] MRN: [id number] Date of Birth: [birth year]  Today's Date: 04/25/2021 Time: SLP Start Time (ACUTE ONLY): 7858 SLP Stop Time (ACUTE ONLY): 1257 SLP Time Calculation (min) (ACUTE ONLY): 18 min  Past Medical History:  Past Medical History:  Diagnosis Date  . AAA (abdominal aortic aneurysm) ([place])   . Atrioventricular block   . Cardiac pacemaker 05/09/10   medtronic dual chamber  . Dyslipidemia   . Nonsustained ventricular tachycardia ([place])   . Paroxysmal atrial fibrillation (HCC)    on Eliquis   Past Surgical History:  Past Surgical History:  Procedure Laterality Date  . ABDOMINAL AORTIC ANEURYSM REPAIR  08/02/08   Dr. Donnetta Hutching  . MENISCUS REPAIR     both knees  . PPM GENERATOR CHANGEOUT N/A 08/03/2018   Procedure: PPM GENERATOR CHANGEOUT;  Surgeon: Sanda Klein, MD;  Location: [facility];  Service: Cardiovascular;  Laterality: N/A;  . RETINAL TEAR REPAIR CRYOTHERAPY     HPI:  87 y.o. male admitted to [facility] ED on 04/17/2021 for shortness of breath, diagnosed with bilateral pneumonia. 6/3 presented with s/s of possible CVA; code stroke called, pt failed [facility] swallow screen, MRI pending. Past medical hx of atrial fibrillation complicated by sick sinus syndrome; pacemaker placed 2011; hypertension; AAA status post repair in 2009.  Dtr at bedside reports hx of esophageal dysphagia s/p dilation approx ten years ago.   Assessment / Plan / Recommendation Clinical Impression  Pt presents with a functional swallow with no acute changes that appear to be stroke-related.  Oral mechanism exam was normal with no focal CN asymmetries; dentition in adequate condition. Pt demonstrated normal mastication of regular solids.  He self regulates quantity of liquid boluses due to hx of esophageal deficits.  There were no overt s/s of aspiration. His RR hovered around 30, and given high levels of supplemental  02 the risk for aspiration is more likely going to be associated with respiratory/swallowing dyssynchrony than a sensorimotor deficit.  D/W pt and dtr. Recommend continuing current diet of regular solids; thin liquids. Crush meds.  Pt is cognizant of pacing himself while eating/drinking and he appears to be protecting his airway. No further SLP needs are identified.  Our service will sign off; please reconsult if we can be of further help. SLP Visit Diagnosis: Dysphagia, unspecified (R13.10)    Aspiration Risk  No limitations    Diet Recommendation   regular solids, thin liquids  Medication Administration: Crushed with puree    Other  Recommendations Oral Care Recommendations: Oral care BID   Follow up Recommendations None      Frequency and Duration            Prognosis        Swallow Study   General HPI: 87 y.o. male admitted to [facility] ED on 04/16/2021 for shortness of breath, diagnosed with bilateral pneumonia. 6/3 presented with s/s of possible CVA; code stroke called, pt failed [facility] swallow screen, MRI pending. Past medical hx of atrial fibrillation complicated by sick sinus syndrome; pacemaker placed 2011; hypertension; AAA status post repair in 2009.  Dtr at bedside reports hx of esophageal dysphagia s/p dilation approx ten years ago. Type of Study: Bedside Swallow Evaluation Previous Swallow Assessment: no Diet Prior to this Study: Regular;Thin liquids Temperature Spikes Noted: No Respiratory Status: Nasal cannula (15l) History of Recent Intubation: No Behavior/Cognition: Alert;Cooperative Oral Cavity Assessment: Within Functional Limits Oral Care Completed  by SLP: Recent completion by staff Oral Cavity - Dentition: Adequate natural dentition Vision: Functional for self-feeding Self-Feeding Abilities: Able to feed self;Needs assist Patient Positioning: Upright in bed Baseline Vocal Quality: Normal Volitional Cough: Strong Volitional Swallow:  Able to elicit    Oral/Motor/Sensory Function Overall Oral Motor/Sensory Function: Within functional limits   Ice Chips Ice chips: Within functional limits   Thin Liquid Thin Liquid: Within functional limits    Nectar Thick Nectar Thick Liquid: Not tested   Honey Thick Honey Thick Liquid: Not tested   Puree Puree: Within functional limits   Solid     Solid: Within functional limits      Juan Quam Laurice 04/25/2021,1:50 PM  Estill Bamberg L. Tivis Ringer, [place] Office number [phone] Pager [phone]

## 2021-04-25 NOTE — Progress Notes (Signed)
Rec'd call from lab r/t elevated Troponin level of 1,826. Notified on-call provider. Family is at bedside at this time. Pt given Morphine 1mg  for labored respirations. Will continue to monitor and tx as needed.

## 2021-04-25 NOTE — Progress Notes (Signed)
VAST responded to CODE STROKE. Pt with 2 IV's in place at this time. Unit RN feels no further access needed at this time.

## 2021-04-25 NOTE — Plan of Care (Signed)
Respiratory complications will improve:Oxygen adjusted as needed. Dx testing ordered as needed.

## 2021-04-25 NOTE — TOC Initial Note (Signed)
Transition of Care [facility]) - Initial/Assessment Note    Patient Details  Name: William [name] MRN: [id number] Date of Birth: [birth year]  [facility] CM/SW Contact:    William Chars, LCSW Phone Number: 04/25/2021, 2:31 PM  Clinical Narrative:  CSW met with pt daughter William [name] in room.  Pt currently at MRI after suspected stroke this AM.  Per William [name], pt lives alone and has no services at home except someone who does cleaning every few weeks.  William [name]'s brother William [name] [phone]) lives in [place] but will be in [place] to also take part in making plans for pt.  Discussed that current recommendation is for [facility] but that may change based on findings from today's events.   Pt is vaccinated for covid and boosted.  PCP in place.  Daughter is expecting pt will need SNF as well, SNF choice document provided and she will begin to research facilities.                 Expected Discharge Plan: Skilled Nursing Facility Barriers to Discharge: Continued Medical Work up   Patient Goals and CMS Choice   CMS Medicare.gov Compare Post Acute Care list provided to:: Patient Represenative (must comment) Choice offered to / list presented to : Spouse  Expected Discharge Plan and Services Expected Discharge Plan: [place] arrangements for the past 2 months: Single Family Home                                      Prior Living Arrangements/Services Living arrangements for the past 2 months: Single Family Home Lives with:: Self Patient language and need for interpreter reviewed:: No        Need for Family Participation in Patient Care: Yes (Comment) Care giver support system in place?: Yes (comment) Current home services: Other (comment) (none.) Criminal Activity/Legal Involvement Pertinent to Current Situation/Hospitalization: No - Comment as needed  Activities of Daily Living Home Assistive Devices/Equipment: Other (Comment) ADL  Screening (condition at time of admission) Patient's cognitive ability adequate to safely complete daily activities?: Yes Is the patient deaf or have difficulty hearing?: No Does the patient have difficulty seeing, even when wearing glasses/contacts?: No Does the patient have difficulty concentrating, remembering, or making decisions?: No Patient able to express need for assistance with ADLs?: Yes Does the patient have difficulty dressing or bathing?: Yes Independently performs ADLs?: No Does the patient have difficulty walking or climbing stairs?: Yes Weakness of Legs: Both Weakness of Arms/Hands: Both  Permission Sought/Granted                  Emotional Assessment       Orientation: : Oriented to Self,Oriented to Place,Oriented to  Time,Oriented to Situation Alcohol / Substance Use: Not Applicable Psych Involvement: No (comment)  Admission diagnosis:  Hypoxia [R09.02] Respiratory failure with hypoxia ([place]) [J96.91] Hypervolemia, unspecified hypervolemia type [E87.70] Patient Active Problem List   Diagnosis Date Noted  . Respiratory failure with hypoxia ([place]) 04/18/2021  . SOB (shortness of breath) 03/24/2021  . Severe sepsis ([place]) 03/31/2021  . CAP (community acquired pneumonia) 04/07/2021  . Elevated troponin 03/27/2021  . Thoracic aortic aneurysm (TAA) ([place]) 04/21/2021  . Paroxysmal atrial fibrillation (HCC)   . Pacemaker battery depletion 08/03/2018  . SSS (sick sinus syndrome) ([place]) 06/12/2018  . PAD (peripheral artery disease) ([place]) 06/12/2018  . Ventricular  tachycardia, nonsustained ([place]) 04/17/2013  . Atrial standstill 04/17/2013  . Complete heart block ([place]) 04/17/2013  . Pacemaker 04/17/2013  . Essential hypertension 04/17/2013  . AAA (abdominal aortic aneurysm) ([place]) 04/17/2013  . Hyperlipidemia 04/17/2013   PCP:  Deland Pretty, MD Pharmacy:   [id number] [place] [id number]:  [phone]: [phone]    Social Determinants of Health (SDOH) Interventions    Readmission Risk Interventions No flowsheet data found.

## 2021-04-25 NOTE — Progress Notes (Signed)
HOSPITAL MEDICINE OVERNIGHT EVENT NOTE    Notified by nursing that patient suddenly developed respiratory distress.  This is associated with hypoxia and increasing oxygen requirement.  Respiratory was contacted and the patient eventually had to be transition to 15 L of oxygen via high flow salter cannula.  Patient's shortness of breath is now improving, patient is now saturating at 94%.  Chart reviewed, patient currently being treated for bilateral pneumonia.  It is unclear as to why patient suddenly has developed shortness of breath and increasing oxygen requirement.  Obtaining ABG and chest x-ray.  Note that patient is on continuous normal saline infusion.  We will temporarily discontinue this.  We will obtain BNP and determine whether or not this is trending upwards.  If this is the case we will provide patient with a trial dose of intravenous Lasix.  Vernelle Emerald  MD [facility]

## 2021-04-25 NOTE — Significant Event (Signed)
Rapid Response Event Note   Reason for Call :  Respiratory distress  Initial Focused Assessment:  Patient lying in bed.  Increased work of breathing. Moderated respiratory distress.  He is diaphoretic. He is fatigued.   Lung sounds with scattered crackles and rhonchi,  Heart tones irregular Vpaced  BP 152/66  HR 70  RR 32-40  O2 sat 94% on NRB  Family at bedside, spoke with Dr Wyline Copas via phone.  Interventions:  Repositioned  Bipap 1mg  morphine PRN  Plan of Care:  RN to call if patient doesn't tolerate bipap or becomes more distressed.  Event Summary:   MD Notified:  Dr Wyline Copas Call Time: 1835 Arrival Time: 1837 End Time: 1905  Raliegh Ip, RN

## 2021-04-25 NOTE — Progress Notes (Signed)
PROGRESS NOTE    William [name]  RKY:[id number] DOB: June 03, 1924 DOA: 04/11/2021 PCP: Deland Pretty, MD    Brief Narrative:  [phone] with hx afib on anticoagulation, AAA s/p repair presented with hypoxemic respiratory failure, found to have B PNA on CT chest with leukocytosis. Pt was started on empiric abx. Hospitalist was consulted for consideration for admission.  Assessment & Plan:   Principal Problem:   Respiratory failure with hypoxia ([facility]) Active Problems:   Essential hypertension   SOB (shortness of breath)   Severe sepsis (HCC)   CAP (community acquired pneumonia)   Elevated troponin   Thoracic aortic aneurysm (TAA) (HCC)   Paroxysmal atrial fibrillation (HCC)  Acute hypoxic respiratory failure secondary to sepsis with bilateral pneumonia present on admit -Pt is continued on 6LNC. Pt is normally O2 naive -Presented requiring 6L high flow O2, needing up to 15L overnight with IVF now stopped -Afebrile. Leukocytosis of 13.2k, down from 19k -Pt is continued on vanc and rocephin. Will f/u MRSA swab, if neg, would d/c vanc -D.dimer elevated at 12.33. CT chest reviewed, neg for PE -Blood cultures are neg -Cont on flutter valve as tolerated -Recent repeat CXR was notable for continued B PNA.  -Repeat COVID test was neg -Now on IV lasix  Elevated d.dimer -Presenting D. Dimer in excess of 12.3 -CT chest reviewed, no evidence of PE noted -Suspect elevation secondary to B pneumonia above -BLE dopplers reviewed, neg for DVT -2d echo performed, normal LV EF, reviewed -Of note, pt had been on eliquis prior to admit  Mildly Elevated troponin:  -mildly elevated initial troponin of 86 at time of presentation, with repeat value demonstrating slight interval trend up to 100 -Denies chest pain this AM -Trop trended down to 85, now 94 -Suspect demand ischemia in light of pneumonia and hypoxemia -2d echo reviewed, normal LVEF, no wall motion abnormalities -CT chest neg for  PE  Thoracic aortic aneurysm:  -Noted on presenting CTA chest, with maximum dimensions 4.5 x 3.9 cm.  No radiographic or clinical evidence to suggest dissection at this time.   -Radiology had recommended semiannual imaging follow-up by CTA or MRA. -Continue home Toprol-XL.  Paroxysmal atrial fibrillation:  -Documented history of such, including a history of complication by sick sinus syndrome prompting pacemaker placement in 2011.  -CHA2DS2-VASc score of 3, had been continued on eliquis -ContinueToprol-XL.   Hypertension:  -Outpatient hypertensive regimen includes lisinopril, HCTZ, and Toprol XL.  -lisinopril and HCTZ initially held given presenting sepsis -Pt is continued on home toprol XL  Hematuria -Painless hematuria noted per pt -UA is pos for large blood -Renal US reviewed, unremarkable. Did not order CT secondary to concerns of renal insufficiency -Hgb is stable -Repeat CBC in AM -Consider referral to Urology once pt is more stable -Given concerns of CVA, would continue anti-platelet/anticoagulant per Neurology recs  Possible CVA -New findings of L arm being flaccid  -Discussed with Neurology and code stroke activated -Appreciate input by Neurology. Recommendation to f/u on MRI. There is consideration for possible dual antiplatelet -will follow up on Neurology recs  DVT prophylaxis: Eliquis Code Status: DNR Family Communication: Pt in room, family is at bedside  Status is: Inpatient  Remains inpatient appropriate because:Inpatient level of care appropriate due to severity of illness  Dispo: The patient is from: Home              Anticipated d/c is to: Unclear at this time  Patient currently is not medically stable to d/c.   Difficult to place patient No    Consultants:   Neurology  Procedures:     Antimicrobials: Anti-infectives (From admission, onward)   Start     Dose/Rate Route Frequency Ordered Stop   04/25/21 1000  cefTRIAXone  (ROCEPHIN) 2 g in sodium chloride 0.9 % 100 mL IVPB        2 g 200 mL/hr over 30 Minutes Intravenous Every 24 hours 04/25/21 0725     04/25/21 1000  azithromycin (ZITHROMAX) tablet 500 mg        500 mg Oral Daily 04/25/21 0731     04/23/21 1300  azithromycin (ZITHROMAX) 500 mg in sodium chloride 0.9 % 250 mL IVPB  Status:  Discontinued        500 mg 250 mL/hr over 60 Minutes Intravenous Every 24 hours 04/21/2021 1954 04/25/21 0731   04/23/21 1200  cefTRIAXone (ROCEPHIN) 1 g in sodium chloride 0.9 % 100 mL IVPB  Status:  Discontinued        1 g 200 mL/hr over 30 Minutes Intravenous Every 24 hours 03/28/2021 1954 04/25/21 0725   04/08/2021 1230  cefTRIAXone (ROCEPHIN) 1 g in sodium chloride 0.9 % 100 mL IVPB        1 g 200 mL/hr over 30 Minutes Intravenous  Once 03/29/2021 1225 04/13/2021 1305   03/26/2021 1230  azithromycin (ZITHROMAX) 500 mg in sodium chloride 0.9 % 250 mL IVPB        500 mg 250 mL/hr over 60 Minutes Intravenous  Once 03/23/2021 1225 03/30/2021 1432      Subjective: Still remains on o2, reports feeling mildly better this AM  Objective: Vitals:   04/25/21 1022 04/25/21 1154 04/25/21 1200 04/25/21 1215  BP: (!) 142/68 137/63 137/66   Pulse:   94   Resp:   (!) 25   Temp:   (!) 97.3 F (36.3 C)   TempSrc:   Oral   SpO2: 92%  94% 95%  Weight:      Height:        Intake/Output Summary (Last 24 hours) at 04/25/2021 1407 Last data filed at 04/25/2021 1000 Gross per 24 hour  Intake 1378.88 ml  Output 550 ml  Net 828.88 ml   Filed Weights   04/21/2021 0950 04/23/21 0543  Weight: 61.2 kg 58.5 kg    Examination: General exam: Conversant, in no acute distress Respiratory system: increased resp effort, clear, no audible wheezing Cardiovascular system: regular rhythm, s1-s2 Gastrointestinal system: Nondistended, nontender, pos BS Central nervous system: No seizures, no tremors, L arm flaccid Extremities: No cyanosis, no joint deformities Skin: No rashes, no pallor Psychiatry:  Affect normal // no auditory hallucinations   Data Reviewed: I have personally reviewed following labs and imaging studies  CBC: Recent Labs  Lab 04/14/2021 0958 04/23/21 0434 04/24/21 0155 04/25/21 0222  WBC 12.8* 9.9 19.1* 13.2*  NEUTROABS  --  8.9*  --   --   HGB 12.4* 12.4* 11.5* 11.7*  HCT 36.2* 36.0* 33.6* 34.1*  MCV 89.6 90.0 89.8 90.2  PLT 303 266 309 725   Basic Metabolic Panel: Recent Labs  Lab 03/30/2021 0958 04/12/2021 1956 04/23/21 0434 04/24/21 0155 04/25/21 0222  NA 134*  --  136 139 140  K 4.1  --  3.7 3.9 4.0  CL 100  --  101 105 109  CO2 19*  --  22 23 23   GLUCOSE 160*  --  182* 146* 153*  BUN 32*  --  42* 58* 40*  CREATININE 0.98  --  1.19 1.35* 1.02  CALCIUM 8.8*  --  9.1 8.9 8.5*  MG  --  1.8 1.8  --   --   PHOS  --   --  3.2  --   --    GFR: Estimated Creatinine Clearance: 36.6 mL/min (by C-G formula based on SCr of 1.02 mg/dL). Liver Function Tests: Recent Labs  Lab 04/15/2021 0958 04/23/21 0434 04/24/21 0155 04/25/21 0222  AST 23 30 43* 34  ALT 10 17 23 24   ALKPHOS 69 67 62 73  BILITOT 0.8 0.6 0.5 0.7  PROT 6.0* 6.3* 5.5* 5.1*  ALBUMIN 3.3* 2.6* 2.4* 2.3*   No results for input(s): LIPASE, AMYLASE in the last 168 hours. No results for input(s): AMMONIA in the last 168 hours. Coagulation Profile: No results for input(s): INR, PROTIME in the last 168 hours. Cardiac Enzymes: No results for input(s): CKTOTAL, CKMB, CKMBINDEX, TROPONINI in the last 168 hours. BNP (last 3 results) No results for input(s): PROBNP in the last 8760 hours. HbA1C: No results for input(s): HGBA1C in the last 72 hours. CBG: Recent Labs  Lab 04/25/21 0937  GLUCAP 233*   Lipid Profile: No results for input(s): CHOL, HDL, LDLCALC, TRIG, CHOLHDL, LDLDIRECT in the last 72 hours. Thyroid Function Tests: No results for input(s): TSH, T4TOTAL, FREET4, T3FREE, THYROIDAB in the last 72 hours. Anemia Panel: No results for input(s): VITAMINB12, FOLATE, FERRITIN, TIBC,  IRON, RETICCTPCT in the last 72 hours. Sepsis Labs: Recent Labs  Lab 04/07/2021 2011 04/05/2021 2208  PROCALCITON 0.18  --   LATICACIDVEN  --  3.0*    Recent Results (from the past 240 hour(s))  Resp Panel by RT-PCR (Flu A&B, Covid) Nasopharyngeal Swab     Status: None   Collection Time: 03/24/2021 10:07 AM   Specimen: Nasopharyngeal Swab; Nasopharyngeal(NP) swabs in vial transport medium  Result Value Ref Range Status   SARS Coronavirus 2 by RT PCR NEGATIVE NEGATIVE Final    Comment: (NOTE) SARS-CoV-2 target nucleic acids are NOT DETECTED.  The SARS-CoV-2 RNA is generally detectable in upper respiratory specimens during the acute phase of infection. The lowest concentration of SARS-CoV-2 viral copies this assay can detect is 138 copies/mL. A negative result does not preclude SARS-Cov-2 infection and should not be used as the sole basis for treatment or other patient management decisions. A negative result may occur with  improper specimen collection/handling, submission of specimen other than nasopharyngeal swab, presence of viral mutation(s) within the areas targeted by this assay, and inadequate number of viral copies(<138 copies/mL). A negative result must be combined with clinical observations, patient history, and epidemiological information. The expected result is Negative.  Fact Sheet for Patients:  EntrepreneurPulse.com.au  Fact Sheet for Healthcare Providers:  IncredibleEmployment.be  This test is no t yet approved or cleared by the [place] FDA and  has been authorized for detection and/or diagnosis of SARS-CoV-2 by FDA under an Emergency Use Authorization (EUA). This EUA will remain  in effect (meaning this test can be used) for the duration of the COVID-19 declaration under Section 564(b)(1) of the Act, 21 U.S.C.section 360bbb-3(b)(1), unless the authorization is terminated  or revoked sooner.       Influenza A by PCR  NEGATIVE NEGATIVE Final   Influenza B by PCR NEGATIVE NEGATIVE Final    Comment: (NOTE) The Xpert Xpress SARS-CoV-2/FLU/RSV plus assay is intended as an aid in the diagnosis of influenza from Nasopharyngeal swab specimens and should not be  used as a sole basis for treatment. Nasal washings and aspirates are unacceptable for Xpert Xpress SARS-CoV-2/FLU/RSV testing.  Fact Sheet for Patients: EntrepreneurPulse.com.au  Fact Sheet for Healthcare Providers: IncredibleEmployment.be  This test is not yet approved or cleared by the [place] FDA and has been authorized for detection and/or diagnosis of SARS-CoV-2 by FDA under an Emergency Use Authorization (EUA). This EUA will remain in effect (meaning this test can be used) for the duration of the COVID-19 declaration under Section 564(b)(1) of the Act, 21 U.S.C. section 360bbb-3(b)(1), unless the authorization is terminated or revoked.  Performed at [facility], [address] [place], [place] [address]   Culture, blood (Routine X 2) w Reflex to ID Panel     Status: None (Preliminary result)   Collection Time: 04/03/2021 10:08 PM   Specimen: BLOOD RIGHT HAND  Result Value Ref Range Status   Specimen Description BLOOD RIGHT HAND  Final   Special Requests   Final    BOTTLES DRAWN AEROBIC AND ANAEROBIC Blood Culture results may not be optimal due to an excessive volume of blood received in culture bottles   Culture   Final    NO GROWTH 3 DAYS Performed at [facility], [place] [address] [place], [place] [address]    Report Status PENDING  Incomplete  Culture, blood (Routine X 2) w Reflex to ID Panel     Status: None (Preliminary result)   Collection Time: 04/17/2021 10:10 PM   Specimen: BLOOD LEFT HAND  Result Value Ref Range Status   Specimen Description BLOOD LEFT HAND  Final   Special Requests   Final    BOTTLES DRAWN AEROBIC AND ANAEROBIC Blood Culture adequate volume    Culture   Final    NO GROWTH 3 DAYS Performed at [facility], [place] [address] [place], [place] [address]    Report Status PENDING  Incomplete  Resp Panel by RT-PCR (Flu A&B, Covid) Nasopharyngeal Swab     Status: None   Collection Time: 04/24/21 11:13 AM   Specimen: Nasopharyngeal Swab; Nasopharyngeal(NP) swabs in vial transport medium  Result Value Ref Range Status   SARS Coronavirus 2 by RT PCR NEGATIVE NEGATIVE Final    Comment: (NOTE) SARS-CoV-2 target nucleic acids are NOT DETECTED.  The SARS-CoV-2 RNA is generally detectable in upper respiratory specimens during the acute phase of infection. The lowest concentration of SARS-CoV-2 viral copies this assay can detect is 138 copies/mL. A negative result does not preclude SARS-Cov-2 infection and should not be used as the sole basis for treatment or other patient management decisions. A negative result may occur with  improper specimen collection/handling, submission of specimen other than nasopharyngeal swab, presence of viral mutation(s) within the areas targeted by this assay, and inadequate number of viral copies(<138 copies/mL). A negative result must be combined with clinical observations, patient history, and epidemiological information. The expected result is Negative.  Fact Sheet for Patients:  EntrepreneurPulse.com.au  Fact Sheet for Healthcare Providers:  IncredibleEmployment.be  This test is no t yet approved or cleared by the [place] FDA and  has been authorized for detection and/or diagnosis of SARS-CoV-2 by FDA under an Emergency Use Authorization (EUA). This EUA will remain  in effect (meaning this test can be used) for the duration of the COVID-19 declaration under Section 564(b)(1) of the Act, 21 U.S.C.section 360bbb-3(b)(1), unless the authorization is terminated  or revoked sooner.       Influenza A by PCR NEGATIVE NEGATIVE Final  Influenza B by PCR  NEGATIVE NEGATIVE Final    Comment: (NOTE) The Xpert Xpress SARS-CoV-2/FLU/RSV plus assay is intended as an aid in the diagnosis of influenza from Nasopharyngeal swab specimens and should not be used as a sole basis for treatment. Nasal washings and aspirates are unacceptable for Xpert Xpress SARS-CoV-2/FLU/RSV testing.  Fact Sheet for Patients: EntrepreneurPulse.com.au  Fact Sheet for Healthcare Providers: IncredibleEmployment.be  This test is not yet approved or cleared by the [place] FDA and has been authorized for detection and/or diagnosis of SARS-CoV-2 by FDA under an Emergency Use Authorization (EUA). This EUA will remain in effect (meaning this test can be used) for the duration of the COVID-19 declaration under Section 564(b)(1) of the Act, 21 U.S.C. section 360bbb-3(b)(1), unless the authorization is terminated or revoked.  Performed at [facility], [place] [address], [place], [place] [address]      Radiology Studies: CT HEAD WO CONTRAST  Result Date: 04/25/2021 CLINICAL DATA:  87 year old male code stroke. EXAM: CT HEAD WITHOUT CONTRAST TECHNIQUE: Contiguous axial images were obtained from the base of the skull through the vertex without intravenous contrast. COMPARISON:  None. FINDINGS: Brain: No midline shift, mass effect, or evidence of intracranial mass lesion. No acute intracranial hemorrhage identified. No ventriculomegaly. Patchy bilateral white matter hypodensity and evidence of several small chronic cerebellar infarcts. No cortically based acute infarct identified. ASPECTS 10. Vascular: Calcified atherosclerosis at the skull base. Subtle but suspicious asymmetric density of the left ICA terminus and left MCA M1 (series 3, image 16). Skull: Negative. Sinuses/Orbits: Visualized paranasal sinuses and mastoids are clear. Other: Postoperative changes to both globes. No gaze deviation. Visualized scalp soft tissues are within  normal limits. IMPRESSION: 1. No acute cortically based infarct or acute intracranial hemorrhage identified but suspicious asymmetric density of the Left ICA terminus and LeftMCA M1. ASPECTS 10. 2. These results were communicated to Dr. Curly Shores at 10:03 am on 04/25/2021 by text page via the [facility] messaging system. 3. Evidence of chronic small vessel disease in the cerebral white matter and the cerebellum. Electronically Signed   By: Genevie Ann M.D.   On: 04/25/2021 10:05   US RENAL  Result Date: 04/24/2021 CLINICAL DATA:  87 year old with hematuria. EXAM: RENAL / URINARY TRACT ULTRASOUND COMPLETE COMPARISON:  None. FINDINGS: Right Kidney: Renal measurements: 7.8 x 5.3 x 3.5 cm = volume: 76 mL. There is mild thinning of the renal parenchyma. No hydronephrosis. Lower pole is obscured by shadowing bowel gas. No evidence of focal lesion or stone, the portions of the kidney are not well seen Left Kidney: Renal measurements: 9.6 x 5.3 x 4.7 cm = volume: 109 mL. Normal parenchymal echogenicity. No hydronephrosis. 1.6 cm cyst in the lower kidney. No evidence of solid lesion or stone. Bladder: Not visualized, may be decompressed. Other: None. IMPRESSION: 1. No obstructive uropathy.  No explanation for hematuria. 2. Thinning of the right renal parenchyma with mild atrophy. Lower pole of the right kidney is partially obscured by overlying bowel gas. 3. Urinary bladder not seen, may be decompressed. Electronically Signed   By: Keith Rake M.D.   On: 04/24/2021 18:50   DG Chest Port 1 View  Result Date: 04/25/2021 CLINICAL DATA:  87 year old male with history of respiratory failure. EXAM: PORTABLE CHEST 1 VIEW COMPARISON:  Chest x-ray 04/24/2021. FINDINGS: There continues to be patchy multifocal ill-defined opacities and areas of interstitial prominence throughout the lungs bilaterally (right greater than left), compatible with severe multilobar bilateral pneumonia. Aeration appears similar to  slightly worsened. No definite  pleural effusions. Pulmonary vasculature does not appear engorged. Heart size is normal. No pneumothorax. Upper mediastinal contours are within normal limits. Aortic atherosclerosis. Left-sided pacemaker device in place with lead tips projecting over the expected location of the right atrium and right ventricle. Atherosclerotic calcifications in the thoracic aorta. IMPRESSION: 1. Severe multilobar bilateral pneumonia again noted with slightly worsened aeration compared to yesterday's examination. 2. Aortic atherosclerosis. Electronically Signed   By: Vinnie Langton M.D.   On: 04/25/2021 05:29   DG CHEST PORT 1 VIEW  Result Date: 04/24/2021 CLINICAL DATA:  Pneumonia EXAM: PORTABLE CHEST 1 VIEW COMPARISON:  Apr 22, 2021 chest radiograph and chest CT FINDINGS: Airspace opacity remains throughout much of the right lung as well as in a more patchy distribution on the left. There are scattered areas of scarring with apparent degree of underlying fibrosis. Heart is mildly enlarged with pulmonary vascularity within normal limits. Pacemaker leads are attached the right atrium and right ventricle. No adenopathy. There is aortic atherosclerosis. There is arthropathy in each shoulder. IMPRESSION: Multifocal airspace opacity persists bilaterally, more severe on the right than on the left. Suspect multifocal pneumonia superimposed on a degree of underlying scarring/fibrosis. No appreciable consolidation. Atypical organism pneumonia could present in this manner. Check of COVID-19 status advised. Stable cardiac silhouette. Pacemaker leads attached to right atrium and right ventricle. Aortic Atherosclerosis (ICD10-I70.0). Electronically Signed   By: Lowella Grip III M.D.   On: 04/24/2021 08:21   ECHOCARDIOGRAM COMPLETE  Result Date: 04/23/2021    ECHOCARDIOGRAM REPORT   Patient Name:   William [name] Date of Exam: 04/23/2021 Medical Rec #:  [id number]       Height:       68.0 in Accession #:    [id number]      Weight:        129.0 lb Date of Birth:  11/22/1924        BSA:          1.696 m Patient Age:    49 years        BP:           122/64 mmHg Patient Gender: M               HR:           87 bpm. Exam Location:  Inpatient Procedure: 2D Echo, Cardiac Doppler, Color Doppler and Intracardiac            Opacification Agent Indications:    Elevated troponin  History:        Patient has prior history of Echocardiogram examinations, most                 recent 10/23/2011. Pacemaker, Arrythmias:Atrial Fibrillation,                 Signs/Symptoms:Shortness of Breath; Risk Factors:Hypertension                 and Former Smoker. Hx sick sinus syndrome. AAA repair (2009).  Sonographer:    Clayton Lefort RDCS (AE) Referring Phys: [id number] Rhetta Mura  Sonographer Comments: Suboptimal apical window and suboptimal subcostal window. Patient unable to move in LLD position due to unrepair torn rotator cuff. IMPRESSIONS  1. Left ventricular ejection fraction, by estimation, is 60 to 65%. The left ventricle has normal function. The left ventricle has no regional wall motion abnormalities. There is mild left ventricular hypertrophy. Left ventricular diastolic parameters are indeterminate.  2. Right ventricular  systolic function is mildly reduced. The right ventricular size is normal. A device lead is visualized in the RA and RV, and appears highly mobile at the level of the tricuspid valve. There is normal pulmonary artery systolic pressure. The estimated right ventricular systolic pressure is 34.7 mmHg.  3. Left atrial size was mildly dilated.  4. The mitral valve is normal in structure. Mild mitral valve regurgitation. No evidence of mitral stenosis.  5. The aortic valve is abnormal. There is moderate calcification of the aortic valve. Aortic valve regurgitation is not visualized. No aortic stenosis is present.  6. The inferior vena cava is normal in size with greater than 50% respiratory variability, suggesting right atrial pressure of 3 mmHg.  FINDINGS  Left Ventricle: Left ventricular ejection fraction, by estimation, is 60 to 65%. The left ventricle has normal function. The left ventricle has no regional wall motion abnormalities. Definity contrast agent was given IV to delineate the left ventricular  endocardial borders. The left ventricular internal cavity size was normal in size. There is mild left ventricular hypertrophy. Left ventricular diastolic parameters are indeterminate. Right Ventricle: The right ventricular size is normal. No increase in right ventricular wall thickness. Right ventricular systolic function is mildly reduced. There is normal pulmonary artery systolic pressure. The tricuspid regurgitant velocity is 2.79 m/s, and with an assumed right atrial pressure of 3 mmHg, the estimated right ventricular systolic pressure is 42.5 mmHg. Left Atrium: Left atrial size was mildly dilated. Right Atrium: Right atrial size was normal in size. Pericardium: There is no evidence of pericardial effusion. Mitral Valve: The mitral valve is normal in structure. Mild mitral valve regurgitation. No evidence of mitral valve stenosis. Tricuspid Valve: The tricuspid valve is normal in structure. Tricuspid valve regurgitation is mild . No evidence of tricuspid stenosis. Aortic Valve: The aortic valve is abnormal. There is moderate calcification of the aortic valve. Aortic valve regurgitation is not visualized. No aortic stenosis is present. Aortic valve mean gradient measures 3.0 mmHg. Aortic valve peak gradient measures 5.3 mmHg. Aortic valve area, by VTI measures 1.67 cm. Pulmonic Valve: The pulmonic valve was normal in structure. Pulmonic valve regurgitation is not visualized. No evidence of pulmonic stenosis. Aorta: The aortic root is normal in size and structure. Venous: The inferior vena cava is normal in size with greater than 50% respiratory variability, suggesting right atrial pressure of 3 mmHg. IAS/Shunts: No atrial level shunt detected by color  flow Doppler. Additional Comments: A device lead is visualized in the and appears highly mobile at the level of the tricuspid valve, right atrium and right ventricle.  LEFT VENTRICLE PLAX 2D LVIDd:         4.00 cm LVIDs:         2.70 cm LV PW:         1.30 cm LV IVS:        1.10 cm LVOT diam:     2.30 cm LV SV:         38 LV SV Index:   23 LVOT Area:     4.15 cm  RIGHT VENTRICLE RV S prime:     14.00 cm/s TAPSE (M-mode): 2.5 cm LEFT ATRIUM           Index LA diam:      3.80 cm 2.24 cm/m LA Vol (A2C): 16.0 ml 9.43 ml/m LA Vol (A4C): 70.0 ml 41.27 ml/m  AORTIC VALVE AV Area (Vmax):    1.95 cm AV Area (Vmean):   1.96 cm AV  Area (VTI):     1.67 cm AV Vmax:           115.20 cm/s AV Vmean:          77.620 cm/s AV VTI:            0.229 m AV Peak Grad:      5.3 mmHg AV Mean Grad:      3.0 mmHg LVOT Vmax:         54.06 cm/s LVOT Vmean:        36.560 cm/s LVOT VTI:          0.092 m LVOT/AV VTI ratio: 0.40  AORTA Ao Root diam: 3.60 cm Ao Asc diam:  2.80 cm TRICUSPID VALVE TR Peak grad:   31.1 mmHg TR Vmax:        279.00 cm/s  SHUNTS Systemic VTI:  0.09 m Systemic Diam: 2.30 cm Cherlynn Kaiser MD Electronically signed by Cherlynn Kaiser MD Signature Date/Time: 04/23/2021/11:27:46 PM    Final    VAS [place] LOWER EXTREMITY VENOUS (DVT)  Result Date: 04/24/2021  Lower Venous DVT Study Patient Name:  William [name]  Date of Exam:   04/24/2021 Medical Rec #: [id number]        Accession #:    [id number] Date of Birth: 07/26/1924         Patient Gender: M Patient Age:   65Y Exam Location:  [facility] Procedure:      VAS [place] LOWER EXTREMITY VENOUS (DVT) Referring Phys: [id number] Deionte Spivack K Carlee Vonderhaar --------------------------------------------------------------------------------  Indications: SOB, and Elevated D-Dimer, pneumonia.  Comparison Study: No prior study on file Performing Technologist: Sharion Dove RVS  Examination Guidelines: A complete evaluation includes B-mode imaging, spectral Doppler, color Doppler, and power Doppler as  needed of all accessible portions of each vessel. Bilateral testing is considered an integral part of a complete examination. Limited examinations for reoccurring indications may be performed as noted. The reflux portion of the exam is performed with the patient in reverse Trendelenburg.  +---------+---------------+---------+-----------+----------+--------------+ RIGHT    CompressibilityPhasicitySpontaneityPropertiesThrombus Aging +---------+---------------+---------+-----------+----------+--------------+ CFV      Full           Yes      Yes                                 +---------+---------------+---------+-----------+----------+--------------+ SFJ      Full                                                        +---------+---------------+---------+-----------+----------+--------------+ FV Prox  Full                                                        +---------+---------------+---------+-----------+----------+--------------+ FV Mid   Full                                                        +---------+---------------+---------+-----------+----------+--------------+ FV DistalFull                                                        +---------+---------------+---------+-----------+----------+--------------+  PFV      Full                                                        +---------+---------------+---------+-----------+----------+--------------+ POP      Full           Yes      Yes                                 +---------+---------------+---------+-----------+----------+--------------+ PTV      Full                                                        +---------+---------------+---------+-----------+----------+--------------+ PERO     Full                                                        +---------+---------------+---------+-----------+----------+--------------+    +---------+---------------+---------+-----------+----------+--------------+ LEFT     CompressibilityPhasicitySpontaneityPropertiesThrombus Aging +---------+---------------+---------+-----------+----------+--------------+ CFV      Full           Yes      Yes                                 +---------+---------------+---------+-----------+----------+--------------+ SFJ      Full                                                        +---------+---------------+---------+-----------+----------+--------------+ FV Prox  Full                                                        +---------+---------------+---------+-----------+----------+--------------+ FV Mid   Full                                                        +---------+---------------+---------+-----------+----------+--------------+ FV DistalFull                                                        +---------+---------------+---------+-----------+----------+--------------+ PFV      Full                                                        +---------+---------------+---------+-----------+----------+--------------+  POP      Full           Yes      Yes                                 +---------+---------------+---------+-----------+----------+--------------+ PTV      Full                                                        +---------+---------------+---------+-----------+----------+--------------+ PERO     Full                                                        +---------+---------------+---------+-----------+----------+--------------+     Summary: BILATERAL: - No evidence of deep vein thrombosis seen in the lower extremities, bilaterally. -   *See table(s) above for measurements and observations. Electronically signed by Servando Snare MD on 04/24/2021 at 4:41:16 PM.    Final    CT ANGIO HEAD NECK W WO CM W PERF (CODE STROKE)  Result Date: 04/25/2021 CLINICAL DATA:  Code stroke  follow-up, possible hyperdense left ICA and MCA EXAM: CT ANGIOGRAPHY HEAD AND NECK CT PERFUSION BRAIN TECHNIQUE: Multidetector CT imaging of the head and neck was performed using the standard protocol during bolus administration of intravenous contrast. Multiplanar CT image reconstructions and MIPs were obtained to evaluate the vascular anatomy. Carotid stenosis measurements (when applicable) are obtained utilizing NASCET criteria, using the distal internal carotid diameter as the denominator. Multiphase CT imaging of the brain was performed following IV bolus contrast injection. Subsequent parametric perfusion maps were calculated using RAPID software. CONTRAST:  65mL OMNIPAQUE IOHEXOL 350 MG/ML SOLN COMPARISON:  Correlation made with prior head CT FINDINGS: CTA NECK FINDINGS Aortic arch: Mixed plaque along the arch with partial visualization of aneurysm better seen on recent CT chest. Great vessel origins are patent. Right carotid system: Patent. Mild mixed plaque at the ICA origin without stenosis. Left carotid system: Patent. Mild calcified plaque at the ICA origin causing minimal stenosis. Vertebral arteries: Patent. Left vertebral artery is dominant. No stenosis. Skeleton: Advanced degenerative changes of the cervical spine. Degenerative changes of the left temporomandibular joint. Other neck: Unremarkable. Upper chest: Increased ground-glass density, patchy consolidation, and interlobular septal thickening in the included upper lobes. Review of the MIP images confirms the above findings CTA HEAD FINDINGS Anterior circulation: Intracranial internal carotid arteries are patent. Calcified plaque is present causing mild to moderate stenosis in the right paraclinoid region. Anterior cerebral arteries are patent with anterior communicating artery present. Middle cerebral arteries are patent. Posterior circulation: Intracranial vertebral arteries are patent with calcified plaque causing mild stenosis. Basilar artery  is patent. Major cerebellar artery origins are patent. Posterior cerebral arteries are patent. Venous sinuses: As permitted by contrast timing, patent. Review of the MIP images confirms the above findings CT Brain Perfusion Findings: CBF (<30%) Volume: 84mL Perfusion (Tmax>6.0s) volume: 66mL Mismatch Volume: 33mL Infarction Location: None. IMPRESSION: No large vessel occlusion, hemodynamically significant stenosis, or evidence of dissection. Perfusion imaging demonstrates no evidence of core infarction or penumbra. Progression of ground-glass density and consolidation in the included upper  lobes. Initial results were communicated to Dr. Curly Shores at 10:37 am on 04/25/2021 by text page via the [employer] messaging system. Electronically Signed   By: Macy Mis M.D.   On: 04/25/2021 10:48    Scheduled Meds: . apixaban  2.5 mg Oral BID  . aspirin  325 mg Oral Once  . azithromycin  500 mg Oral Daily  . furosemide  40 mg Intravenous Daily  . metoprolol succinate  25 mg Oral Daily  . sodium chloride  1 spray Each Nare Q4H  . traMADol  50 mg Oral Q12H   Continuous Infusions: . sodium chloride    . cefTRIAXone (ROCEPHIN)  IV 1 g (04/25/21 1131)     LOS: 3 days   Marylu Lund, MD [facility] Pager On Amion  If 7PM-7AM, please contact night-coverage 04/25/2021, 2:07 PM

## 2021-04-25 NOTE — Progress Notes (Signed)
The patient is in distress. Respirations elevated, bp stable. Patient's breathing is labored. RRT called, MD notified. See new orders.

## 2021-04-25 NOTE — Progress Notes (Signed)
Per order, changed device settings for MRI to DOO at 90 bpm   Tachy-therapies to off if applicable.   Will program device back to pre-MRI settings after completion of exam.

## 2021-04-25 NOTE — Progress Notes (Signed)
PT Cancellation Note  Patient Details Name: DEMIAN MAISEL MRN: [id number] DOB: [birth year]   Cancelled Treatment:    Reason Eval/Treat Not Completed: Medical issues which prohibited therapy (Work up for CVA.  Will await results.)   Alvira Philips 04/25/2021, 10:31 AM  Amour Cutrone M,PT Acute Rehab Services [phone] (pager)

## 2021-04-25 NOTE — Progress Notes (Signed)
the patient is unable to move his left arm. No signs of stroke. OT worked with him this morning and brought it to my attention. No mention in PT notes from yesterday. MD notified. Code stroke activated.

## 2021-04-25 NOTE — Progress Notes (Signed)
On nurse rounds pt was noted to be sob. Oxygen at 6l, Pt was repositioned in bed and head of bed elevated to assist with breathing. Called respiratory in to evaluate as well as notified on call provider. Pt also medicated for c/o generalized pain.

## 2021-04-25 NOTE — Code Documentation (Signed)
Stroke Response Nurse Documentation Code Documentation  William [name] is a 87 y.o. male admitted to [facility] ED on 03/24/2021 for shortness of breath, diagnosed with bilateral pneumonia. Past medical hx of atrial fibrillation complicated by sick sinus syndrome; pacemaker placed 2011; hypertension; AAA status post repair in 2009. On Eliquis (apixaban) with last dose 04/24/2021 2104.  Code stroke was activated by bedside RN. OT ambulated the patient to the chair this morning and noted patient unable to move left arm. Notified the RN. Unsure when arm weakness occurred. Clarified with PT patient was able to move left arm yesterday during eval around 1736.    Provider at bedside upon arrival to 2W03. Patient to CT with team. NIHSS 6, see documentation for details and code stroke times. Patient with disoriented, left facial droop, left arm weakness and left leg weakness on exam. The following imaging was completed:  CT, CTA head and neck, CTP. Able to move left arm intermittently while in CT. Patient is not a candidate for tPA due to recent dose of Eliquis. Care/Plan MRI if able with pacemaker; Q2 VS and neuro for 12 hours, then q4. Yale swallow screen needed prior to anything by mouth. Bedside handoff with 2W RN and charge RN.    Leverne Humbles Stroke Response RN

## 2021-04-25 NOTE — Progress Notes (Signed)
Occupational Therapy Treatment Patient Details Name: William [name] MRN: [id number] DOB: 26-Sep-1924 Today's Date: 04/25/2021    History of present illness Pt adm 5/31 with acute hypoxemic respiratory failure due to Bil PNA. PMH - HTN, thoracic aortic aneurysm, afib, pacer   OT comments  OT entered room to place sign above bed on LUE precautions. Patient found seated in recliner with R lateral lean despite pillows for positioning. Patient states that he is aware of poor position but too weak to correct it. Max A to position trunk in midline and pillows adjusted. Patient reports frustration at inability to feed himself. This therapist notes spilled coffee on tray table. Patient unable to spear items on tray even with hand over hand assist using dominant RUE. Max A to bring fork to mouth. Patient also requires Max A and hand over hand to bring cup to mouth and take sips. RN notified that patient requires assist for self-feeding. Education also provided on positioning of LUE for pain management and comfort. OT will continue to follow acutely.    Follow Up Recommendations  SNF;Home health OT;Supervision/Assistance - 24 hour    Equipment Recommendations  Other (comment) (TBD)    Recommendations for Other Services      Precautions / Restrictions Precautions Precautions: Fall;Other (comment) Precaution Comments: watch SpO2 Restrictions Weight Bearing Restrictions: Yes Other Position/Activity Restrictions: Presumed injury to L shoulder. RN and MD notified.       Mobility Bed Mobility Overal bed mobility: Needs Assistance Bed Mobility: Supine to Sit     Supine to sit: HOB elevated;Max assist     General bed mobility comments: Seated in recliner upon re-entry    Transfers Overall transfer level: Needs assistance Equipment used: None Transfers: Squat Pivot Transfers     Squat pivot transfers: Max assist     General transfer comment: Patient unable to stand with 1 person assist.  Max A for squat-pivot transfer to recliner on R with cues for hand placement.    Balance Overall balance assessment: Needs assistance Sitting-balance support: Bilateral upper extremity supported;Feet supported Sitting balance-Leahy Scale: Poor Sitting balance - Comments: R lateral lean in recliner. Adjusted pillows to position trunk in midline. Postural control: Right lateral lean;Posterior lean Standing balance support: Single extremity supported Standing balance-Leahy Scale: Poor Standing balance comment: Unable to use LUE to maintain static sitting balance at EOB. R lateral lean when seated in recliner. Pillows placed to position trunk in midline.                           ADL either performed or assessed with clinical judgement   ADL Overall ADL's : Needs assistance/impaired Eating/Feeding: Maximal assistance;Sitting Eating/Feeding Details (indicate cue type and reason): Unable to grasp utensils or spear items on breakfast tray without hand over hand and max A. Difficulty finding mouth when attempting to eat "finger foods". Difficulty extending digits to grasp cup. Noted coughing after taking sips from cup using straw. Grooming: Maximal assistance;Standing           Upper Body Dressing : Maximal assistance;Sitting Upper Body Dressing Details (indicate cue type and reason): Max A to don anterior hosptial gown seated EOB. Unable to lift LUE to thread requiring hand over hand assist. Lower Body Dressing: Total assistance;Bed level   Toilet Transfer: Maximal assistance;Squat-pivot Toilet Transfer Details (indicate cue type and reason): Simulated with squat-pivot transfer to recliner with Max A and cues for hand placement.  General ADL Comments: Patient greatly limited by severe generalized weakness, decreased cognition and suspected L shoudler injury. RN and MD notified.     Vision Baseline Vision/History: Wears glasses Wears Glasses: At all times Patient  Visual Report: No change from baseline Vision Assessment?: No apparent visual deficits   Perception     Praxis      Cognition Arousal/Alertness: Awake/alert Behavior During Therapy: WFL for tasks assessed/performed Overall Cognitive Status: Impaired/Different from baseline Area of Impairment: Orientation;Memory                 Orientation Level: Time   Memory: Decreased short-term memory         General Comments: Patient states "1922" when asked current year. Unable to recall month/day. Did not attempt to self-correct. Patient with conflicting recall of events leading to inability to move L arm. Initially states weakness onset several days ago but when asked by RN later states weakness began last Tuesday.        Exercises     Shoulder Instructions       General Comments SpO2 98% on 15L via HFNC.    Pertinent Vitals/ Pain       Pain Assessment: Faces Faces Pain Scale: Hurts even more Pain Location: With active and passive movement at L shoulder Pain Descriptors / Indicators: Sharp;Grimacing Pain Intervention(s): Limited activity within patient's tolerance;Monitored during session;Repositioned;Other (comment) (RN notified)  Home Living Family/patient expects to be discharged to:: Private residence Living Arrangements: Alone Available Help at Discharge: Family;Available PRN/intermittently (Son coming from [place].) Type of Home: House Home Access: Stairs to enter Technical brewer of Steps: 4 Entrance Stairs-Rails: Right Home Layout: One level     Bathroom Shower/Tub: Occupational psychologist: Standard     Home Equipment: Cane - single point;Grab bars - toilet;Grab bars - tub/shower          Prior Functioning/Environment Level of Independence: Independent with assistive device(s)        Comments: Uses cane. Drives. No falls.   Frequency  Min 2X/week        Progress Toward Goals  OT Goals(current goals can now be found in the  care plan section)  Progress towards OT goals: Progressing toward goals  Acute Rehab OT Goals Patient Stated Goal: To be able to feed himself. OT Goal Formulation: With patient Time For Goal Achievement: 05/09/21 Potential to Achieve Goals: Economy Discharge plan remains appropriate;Frequency remains appropriate    Co-evaluation                 AM-PAC OT "6 Clicks" Daily Activity     Outcome Measure   Help from another person eating meals?: A Lot Help from another person taking care of personal grooming?: A Lot Help from another person toileting, which includes using toliet, bedpan, or urinal?: Total Help from another person bathing (including washing, rinsing, drying)?: Total Help from another person to put on and taking off regular upper body clothing?: A Lot Help from another person to put on and taking off regular lower body clothing?: Total 6 Click Score: 9    End of Session Equipment Utilized During Treatment: Oxygen  OT Visit Diagnosis: Unsteadiness on feet (R26.81);Muscle weakness (generalized) (M62.81);Other abnormalities of gait and mobility (R26.89);Pain Pain - Right/Left: Left Pain - part of body: Shoulder   Activity Tolerance Patient limited by lethargy;Patient limited by fatigue   Patient Left in chair;with call bell/phone within reach;with chair alarm set   Nurse Communication Mobility status;Other (comment) (  Suspected injury to L shoulder joint. MD and RN notified.)        Time: 7673-4193 OT Time Calculation (min): 23 min  Charges: OT General Charges $OT Visit: 1 Visit OT Evaluation $OT Eval Moderate Complexity: 1 Mod OT Treatments $Self Care/Home Management : 8-22 mins  Quintessa Simmerman H. OTR/L Supplemental OT, Department of rehab services [phone]   Falon Huesca R H. 04/25/2021, 9:34 AM

## 2021-04-25 NOTE — Progress Notes (Signed)
Hey. The patient respirations are reaching the 30s. MD notified, see new orders.

## 2021-04-25 NOTE — Progress Notes (Signed)
Pt on [place] HF upon arrival to room. Pt's RR was in the 40's and sats were in the low 90's and high 80's. Pt placed on 20L High Flow at this time and pt improved almost immediately. RR came down into the low 30's and sat is now 93% RT to continue to monitor as needed. Daughter made aware and informed on HHF.

## 2021-04-25 NOTE — Progress Notes (Signed)
PHARMACIST - PHYSICIAN COMMUNICATION DR:   Wyline Copas  CONCERNING: Antibiotic IV to Oral Route Change Policy  RECOMMENDATION: This patient is receiving azithromycin by the intravenous route.  Based on criteria approved by the Pharmacy and Therapeutics Committee, the antibiotic(s) is/are being converted to the equivalent oral dose form(s).   DESCRIPTION: These criteria include:  Patient being treated for a respiratory tract infection, urinary tract infection, cellulitis or clostridium difficile associated diarrhea if on metronidazole  The patient is not neutropenic and does not exhibit a GI malabsorption state  The patient is eating (either orally or via tube) and/or has been taking other orally administered medications for a least 24 hours  The patient is improving clinically and has a Tmax < 100.5  If you have questions about this conversion, please contact the Pharmacy Department  []   [phone] )  [facility] []   [phone] )  [facility] [x]   [phone] )  [facility] []   [phone] )  [facility] []   [phone] )  [facility]

## 2021-04-25 NOTE — Consult Note (Signed)
Requesting Physician: Dr. Wyline Copas     Chief Complaint: Left sided weakness   History obtained from:  Patient, Daughter and Chart   HPI:  William [name] is an 87 y.o. male w/pmh afib on anticoagulation, AAA s/p repair who initially presented with hypoxemic respiratory failure, found to have B PNA on CT chest with leukocytosis. On 04/25/21 at 929 AM he was noted to have left arm weakness by occupational therapy for which a code stroke was initiated.   Per the patient when he woke up at 230 AM this morning he noted that his left arm was weak. When he went to sleep last night at [facility] he states he did not have any left arm weakness.  He did have an event where he developed shortness of breath and increasing oxygen requirement at about 4:30 AM  Patients daughter helped corroborate history; she was with him last night and when she left at 9PM he was doing well with no weakness that she noted. He has not had a prior stroke in the past. Was noted to have 2 hour episode of atrial flutter in 2020 and 12 hour atrial fibrillation event in 2021- was placed on Eliquis 5 mg BID by Cardiology. Has been receiving Eliquis this admission, at a reduced dose of 2.5 mg twice daily  Date last known well: 04/24/21  Time last known well: 8PM-9PM  tPA Given: No, outside the time window for IVTPA  Modified Rankin: Rankin Score=4  Past Medical History:  Diagnosis Date  . AAA (abdominal aortic aneurysm) ([place])   . Atrioventricular block   . Cardiac pacemaker 05/09/10   medtronic dual chamber  . Dyslipidemia   . Nonsustained ventricular tachycardia ([place])   . Paroxysmal atrial fibrillation ([facility])    on Eliquis    Past Surgical History:  Procedure Laterality Date  . ABDOMINAL AORTIC ANEURYSM REPAIR  08/02/08   Dr. Donnetta Hutching  . MENISCUS REPAIR     both knees  . PPM GENERATOR CHANGEOUT N/A 08/03/2018   Procedure: PPM GENERATOR CHANGEOUT;  Surgeon: Sanda Klein, MD;  Location: [facility];  Service: Cardiovascular;   Laterality: N/A;  . RETINAL TEAR REPAIR CRYOTHERAPY      History reviewed. No pertinent family history.       Social History:  reports that he quit smoking about 46 years ago. He has never used smokeless tobacco. He reports current alcohol use of about 7.0 standard drinks of alcohol per week. He reports that he does not use drugs.  Allergies:  Allergies  Allergen Reactions  . Statins Other (See Comments)    Myalgia: Rosuvastatin, Atorvastatin, Simvastatin    Medications: I have reviewed the patient's current medications.  ROS: History obtained from the patient and patients daughter   General ROS: negative for - chills, fatigue, fever, night sweats, weight gain or weight loss Psychological ROS: negative for - , hallucinations, memory difficulties, mood swings or  Ophthalmic ROS: negative for - blurry vision, double vision, eye pain or loss of vision ENT ROS: negative for - epistaxis, nasal discharge, oral lesions, sore throat, tinnitus or vertigo Respiratory ROS: negative for - cough, positive for wheezing and shortness of breath Cardiovascular ROS: negative for - chest pain, dyspnea on exertion,  Gastrointestinal ROS: negative for - abdominal pain, diarrhea,  nausea/vomiting or stool incontinence Genito-Urinary ROS: negative for - dysuria, hematuria, incontinence or urinary frequency/urgency Musculoskeletal ROS: negative for - joint swelling or muscular weakness Neurological ROS: as noted in HPI  General Examination: Constitutional: Frail  elderly caucasian male resting in bed Cardiovascular: RRR Respiratory: Increased work of breathing, nasal cannula in place  Neurological Examination Mental Status: AAOx4, following commands  Speech: Fluent with naming and repetition intact  Cranial Nerves: EOMI, VFF, Face symmetric, Tongue midline, weak shoulder shrug on the left, right shoulder shrug intact  Motor:   Dlt Bic Tri FgS Grp HF  KnF KnE PIF DoF  R 5 5 5 5 5 3 4 4 5 5   L 2 2 4-  4+ 4+ 2 4- 4- 5 5   Sensory: Intact to light touch throughout  Deep Tendon Reflexes: 2+ and symmetric throughout, withdrew with both plantars  Cerebellar: FNF HTS intact  Gait: Deferred due to patient acuity with new left sided weakness    1a Level of Conscious: 0  1b LOC Questions: 0 1c LOC Commands: 0 2 Best Gaze: 0 3 Visual: 0 4 Facial Palsy: 0 5a Motor Arm - left: 3 5b Motor Arm - Right: 0 6a Motor Leg - Left: 2 6b Motor Leg - Right: 1 7 Limb Ataxia: 0 8 Sensory: 0 9 Best Language: 0 10 Dysarthria: 0 11 Extinct. and Inatten: 0  TOTAL: 6  Pertinent Labs and Imaging:   Blood cultures from admission negative to date Creatinine 1.02, GFR greater than 60 Leukocytosis to 13.2  CT Head 04/25/21 1. No acute cortically based infarct or acute intracranial hemorrhage identified but suspicious asymmetric density of the Left ICA terminus and LeftMCA M1. ASPECTS 10.  CTA Head and Neck + CT Perfusion 04/25/21 No large vessel occlusion, hemodynamically significant stenosis, or evidence of dissection.  Perfusion imaging demonstrates no evidence of core infarction or penumbra.  Progression of ground-glass density and consolidation in the included upper lobes.  Assessment:  William [name] is a 87 year old male w/pmh of afib on anticoagulation, AAA s/p repair who initially presented with hypoxemic respiratory failure. Neurology was consulted for a code stroke due to acute onset left arm weakness that patient noted at 230AM on 04/25/21 with LKW the night prior at 8PM-9PM. Unfortunately he was outside the time window for IVTPA.   During the code stroke his neuro examination fluctuated; he was noted to have a left inferior quadrantanopsia which then improved. LUE weakness worsened as the code stroke continued. Vessel imaging with CTA Head and Neck was negative for LVO. CT Head with no acute stroke. Given lack of LVO he was not a candidate for thrombectomy.   More than likely he has had a  sub cortical stroke and stroke work up is indicated. No need for permissive HTN for now; exam is not pressure dependent and he does not have critical stenosis or a large vessel occlusion.   If no other etiology of his multifocal strokes is found, please consider resuming Eliquis 5 mg twice daily, patient just barely meets the criteria of being less than 60 kgs at 58.5 kg and his creatinine has been excellent other than an isolated reading of 1.35 on 6/2  Recommendations:  - MRI Brain WO Contrast completed, on attending review there are scattered strokes and an embolic versus watershed pattern throughout both cerebral and cerebellar hemispheres - Echocardiogram completed, please follow-up with cardiology about significance of highly mobile device lead in the right heart, consider TEE to evaluate for intracardiac thrombus or endocarditis - Lipid panel, A1C labs  - No need for permissive hypertension, however please monitor carefully and avoid hypotension - Hold anticoagulation for now to avoid hemorrhagic conversion of recent stroke, aspirin 325  mg daily until anticoagulation is resumed - Telemetry monitoring - Frequent neuro checks - NPO until passes stroke swallow screen  - Appreciate management of respiratory distress and other chronic issues per primary team - Stroke team to follow  Courtney Heard-NP  [facility] 04/25/2021, 10:07 AM   Patient seen and discussed with attending physician Dr. Curly Shores    Attending Neurologist's note:  I personally saw this patient, gathering history, performing a full neurologic examination, reviewing relevant labs, personally reviewing relevant imaging including head CT, CTA, CT perfusion, and MRI brain, and formulated the assessment and plan, adding the note above for completeness and clarity to accurately reflect my thoughts  Lesleigh Noe MD-PhD [facility] [phone] Available 7 AM to 7 PM, outside these hours please contact  Neurologist on call listed on AMION

## 2021-04-26 DIAGNOSIS — I214 Non-ST elevation (NSTEMI) myocardial infarction: Secondary | ICD-10-CM | POA: Diagnosis not present

## 2021-04-26 DIAGNOSIS — R0602 Shortness of breath: Secondary | ICD-10-CM

## 2021-04-26 DIAGNOSIS — Z7189 Other specified counseling: Secondary | ICD-10-CM | POA: Diagnosis not present

## 2021-04-26 DIAGNOSIS — J9601 Acute respiratory failure with hypoxia: Secondary | ICD-10-CM

## 2021-04-26 DIAGNOSIS — R0902 Hypoxemia: Secondary | ICD-10-CM | POA: Diagnosis not present

## 2021-04-26 DIAGNOSIS — Z515 Encounter for palliative care: Secondary | ICD-10-CM

## 2021-04-26 DIAGNOSIS — I6389 Other cerebral infarction: Secondary | ICD-10-CM | POA: Diagnosis not present

## 2021-04-26 DIAGNOSIS — Z66 Do not resuscitate: Secondary | ICD-10-CM

## 2021-04-26 LAB — CBC
HCT: 37.8 % — ABNORMAL LOW (ref 39.0–52.0)
Hemoglobin: 12.5 g/dL — ABNORMAL LOW (ref 13.0–17.0)
MCH: 30.3 pg (ref 26.0–34.0)
MCHC: 33.1 g/dL (ref 30.0–36.0)
MCV: 91.5 fL (ref 80.0–100.0)
Platelets: 145 10*3/uL — ABNORMAL LOW (ref 150–400)
RBC: 4.13 MIL/uL — ABNORMAL LOW (ref 4.22–5.81)
RDW: 13.7 % (ref 11.5–15.5)
WBC: 16.8 10*3/uL — ABNORMAL HIGH (ref 4.0–10.5)
nRBC: 0 % (ref 0.0–0.2)

## 2021-04-26 LAB — BLOOD GAS, ARTERIAL
Acid-Base Excess: 1.9 mmol/L (ref 0.0–2.0)
Bicarbonate: 25.4 mmol/L (ref 20.0–28.0)
FIO2: 70
O2 Saturation: 98.9 %
Patient temperature: 36.8
pCO2 arterial: 35.8 mmHg (ref 32.0–48.0)
pH, Arterial: 7.464 — ABNORMAL HIGH (ref 7.350–7.450)
pO2, Arterial: 158 mmHg — ABNORMAL HIGH (ref 83.0–108.0)

## 2021-04-26 LAB — COMPREHENSIVE METABOLIC PANEL
ALT: 24 U/L (ref 0–44)
AST: 30 U/L (ref 15–41)
Albumin: 2.5 g/dL — ABNORMAL LOW (ref 3.5–5.0)
Alkaline Phosphatase: 93 U/L (ref 38–126)
Anion gap: 12 (ref 5–15)
BUN: 40 mg/dL — ABNORMAL HIGH (ref 8–23)
CO2: 26 mmol/L (ref 22–32)
Calcium: 8.9 mg/dL (ref 8.9–10.3)
Chloride: 105 mmol/L (ref 98–111)
Creatinine, Ser: 1.29 mg/dL — ABNORMAL HIGH (ref 0.61–1.24)
GFR, Estimated: 51 mL/min — ABNORMAL LOW (ref >60)
Glucose, Bld: 171 mg/dL — ABNORMAL HIGH (ref 70–99)
Potassium: 3.8 mmol/L (ref 3.5–5.1)
Sodium: 143 mmol/L (ref 135–145)
Total Bilirubin: 1.3 mg/dL — ABNORMAL HIGH (ref 0.3–1.2)
Total Protein: 5.8 g/dL — ABNORMAL LOW (ref 6.5–8.1)

## 2021-04-26 MED ORDER — MORPHINE 100MG IN NS 100ML (1MG/ML) PREMIX INFUSION
2.0000 mg/h | INTRAVENOUS | Status: DC
  Administered 2021-04-26: 2 mg/h via INTRAVENOUS
  Administered 2021-04-27: 9 mg/h via INTRAVENOUS
  Filled 2021-04-26 (×2): qty 100

## 2021-04-26 MED ORDER — ONDANSETRON 4 MG PO TBDP: 4.0000 mg | ORAL_TABLET | Freq: Four times a day (QID) | ORAL | Status: DC | PRN

## 2021-04-26 MED ORDER — POLYVINYL ALCOHOL 1.4 % OP SOLN
1.0000 [drp] | Freq: Four times a day (QID) | OPHTHALMIC | Status: DC | PRN
  Filled 2021-04-26: qty 15

## 2021-04-26 MED ORDER — GLYCOPYRROLATE 0.2 MG/ML IJ SOLN
0.2000 mg | INTRAMUSCULAR | Status: DC | PRN
  Administered 2021-04-26: 0.2 mg via INTRAVENOUS

## 2021-04-26 MED ORDER — GLYCOPYRROLATE 0.2 MG/ML IJ SOLN
0.2000 mg | INTRAMUSCULAR | Status: DC | PRN
  Filled 2021-04-26: qty 1

## 2021-04-26 MED ORDER — LORAZEPAM 2 MG/ML PO CONC: 1.0000 mg | ORAL | Status: DC | PRN

## 2021-04-26 MED ORDER — MORPHINE BOLUS VIA INFUSION
2.0000 mg | INTRAVENOUS | Status: DC | PRN
  Filled 2021-04-26: qty 2

## 2021-04-26 MED ORDER — LORAZEPAM 1 MG PO TABS: 1.0000 mg | ORAL_TABLET | ORAL | Status: DC | PRN

## 2021-04-26 MED ORDER — GLYCOPYRROLATE 1 MG PO TABS: 1.0000 mg | ORAL_TABLET | ORAL | Status: DC | PRN

## 2021-04-26 MED ORDER — HALOPERIDOL LACTATE 2 MG/ML PO CONC
0.5000 mg | ORAL | Status: DC | PRN
  Filled 2021-04-26: qty 0.3

## 2021-04-26 MED ORDER — LORAZEPAM 2 MG/ML IJ SOLN
1.0000 mg | INTRAMUSCULAR | Status: DC | PRN
  Administered 2021-04-26: 1 mg via INTRAVENOUS
  Filled 2021-04-26: qty 1

## 2021-04-26 MED ORDER — HALOPERIDOL LACTATE 5 MG/ML IJ SOLN: 0.5000 mg | INTRAMUSCULAR | Status: DC | PRN

## 2021-04-26 MED ORDER — BIOTENE DRY MOUTH MT LIQD
15.0000 mL | Freq: Two times a day (BID) | OROMUCOSAL | Status: DC
  Administered 2021-04-26: 15 mL via TOPICAL

## 2021-04-26 MED ORDER — ONDANSETRON HCL 4 MG/2ML IJ SOLN: 4.0000 mg | Freq: Four times a day (QID) | INTRAMUSCULAR | Status: DC | PRN

## 2021-04-26 MED ORDER — HALOPERIDOL 1 MG PO TABS: 0.5000 mg | ORAL_TABLET | ORAL | Status: DC | PRN

## 2021-04-26 MED ORDER — MORPHINE SULFATE (PF) 2 MG/ML IV SOLN: 2.0000 mg | INTRAVENOUS | Status: DC | PRN

## 2021-04-26 NOTE — Progress Notes (Signed)
Pt in bed resting quietly at this time. Medicated for pain, was showing non-verbal pain indicators such as facial grimacing. Pt is currently on high flow oxygen and tolerating well.  Md did come and talk with the family r/t ongoing medical issues. Oral care provided. Family remains at bedside.

## 2021-04-26 NOTE — Progress Notes (Signed)
Removed patient from bipap as per doctor and placed on 12 lpm high flow salter cannula with Sp02=93%. Will continue to monitor patient.

## 2021-04-26 NOTE — Progress Notes (Addendum)
HOSPITAL MEDICINE OVERNIGHT EVENT NOTE    Notified by nursing earlier in the evening and just before shift change the patient exhibited increasing shortness of breath and respiratory distress.  Patient was placed on BiPAP therapy by the daytime provider.  Serial troponins were then ordered.  It turns out the serial troponins came back markedly elevated at [place] followed by 2105.  Extremely elevated troponins as mentioned above are suggestive that patient is suffering an NSTEMI.    I went to go evaluate the patient at the bedside.  Patient is minimally arousable and unable to report whether or not he is experiencing chest pain.  I obtained an EKG as well as an ABG.  EKG revealed a ventricular paced rhythm with ST segments that are not particularly sensitive for ischemic changes.  ABG was relatively unremarkable with markedly elevated O2.  ABG suggested that patient likely does not need BiPAP therapy.  I sat down with the daughter who is also power of attorney and spoke to her about the patient's condition for a lengthy period of time.  Patient's prognosis is exceedingly poor with the presence of substantial pneumonia, respiratory failure, bilateral strokes and now an NSTEMI.    Daughter has stated that she has discussed it with her brother at length and they feel that the patient's wish at this point is to proceed with comfort measures.  She wishes for the patient be taken off of BiPAP and switched over to high flow nasal cannula at this time.  She is agreeable to meeting with palliative care later today to further discuss goals of care.  Based on patient's clinical course, arrangements can be made for homegoing hospice versus inpatient hospice care options.   Vernelle Emerald  MD [facility]

## 2021-04-26 NOTE — Progress Notes (Signed)
PROGRESS NOTE    APOLONIO CUTTING  HQI:[id number] DOB: [birth year] DOA: 04/08/2021 PCP: Deland Pretty, MD    Brief Narrative:  [phone] with hx afib on anticoagulation, AAA s/p repair presented with hypoxemic respiratory failure, found to have B PNA on CT chest with leukocytosis. Pt was started on empiric abx. Hospitalist was consulted for consideration for admission.  Assessment & Plan:   Principal Problem:   Respiratory failure with hypoxia ([place]) Active Problems:   Essential hypertension   SOB (shortness of breath)   Severe sepsis (HCC)   CAP (community acquired pneumonia)   Elevated troponin   Thoracic aortic aneurysm (TAA) (HCC)   Paroxysmal atrial fibrillation (HCC)   Acute arterial ischemic stroke, multifocal, multiple vascular territories ([place])  Acute hypoxic respiratory failure secondary to sepsis with bilateral pneumonia present on admit -Presented needing 6LNC, o2 naive -despite broad spectrum abx, trials of IV lasix, O2 requirements worsened to needing bipap support -Serial chest xrays reviewed, findings worrisome for worsening pna -Covid confirmed neg -CT chest neg for PE -See below. Given rapid decompensation decision was made to transition to comfort status  Elevated d.dimer -Presenting D. Dimer in excess of 12.3 -CT chest reviewed, no evidence of PE noted -now focus on comfort care  NSTEMI, confirmed -mildly elevated initial troponin of 86 at time of presentation, with repeat value demonstrating slight interval trend up to 100 -during this admit, pt's O2 requirements worsened and pt developed acute chest pains -Repeat troponins noted to be 1826 with repeat 2105 -Now focus on comfort care  Thoracic aortic aneurysm:  -Noted on presenting CTA chest, with maximum dimensions 4.5 x 3.9 cm.  No radiographic or clinical evidence to suggest dissection at this time.   -Radiology had recommended semiannual imaging follow-up by CTA or MRA. -Now transitioned to comfort  status  Paroxysmal atrial fibrillation:  -Documented history of such, including a history of complication by sick sinus syndrome prompting pacemaker placement in 2011.  -CHA2DS2-VASc score of 3, had been continued on eliquis -Now on comfort measures only  Hypertension:  -Outpatient hypertensive regimen includes lisinopril, HCTZ, and Toprol XL.  -lisinopril and HCTZ initially held given presenting sepsis -Pt is now comfort status only, per below  Hematuria -Painless hematuria noted per pt -UA is pos for large blood -Renal US reviewed, unremarkable. Did not order CT secondary to concerns of renal insufficiency -Hgb had remained stable -now comfort measures only  Multiple acute embolic bilateral CVA confirmed -New findings of L arm being flaccid  -Discussed with Neurology and code stroke activated -MRI reviewed with family. Confirmed multiple bilateral embolic CVA -Given rapid decompensation, now comfort care only  End of Life, DNR, Comfort Care -Per family, patient's wishes are noted to be DNR/DNI -Appreciate input by Palliative Care -Patient has since transitioned to full comfort care, anticipating hospital death  DVT prophylaxis: Eliquis Code Status: DNR Family Communication: Pt in room, family is at bedside  Status is: Inpatient  Remains inpatient appropriate because:Inpatient level of care appropriate due to severity of illness  Dispo: The patient is from: Home              Anticipated d/c is to: hospital death              Patient currently is not medically stable to d/c.   Difficult to place patient No    Consultants:   Neurology  Palliative Care  Procedures:     Antimicrobials: Anti-infectives (From admission, onward)   Start     Dose/Rate  Route Frequency Ordered Stop   04/25/21 1000  cefTRIAXone (ROCEPHIN) 2 g in sodium chloride 0.9 % 100 mL IVPB  Status:  Discontinued        2 g 200 mL/hr over 30 Minutes Intravenous Every 24 hours 04/25/21 0725  04/26/21 0905   04/25/21 1000  azithromycin (ZITHROMAX) tablet 500 mg  Status:  Discontinued        500 mg Oral Daily 04/25/21 0731 04/26/21 0905   04/23/21 1300  azithromycin (ZITHROMAX) 500 mg in sodium chloride 0.9 % 250 mL IVPB  Status:  Discontinued        500 mg 250 mL/hr over 60 Minutes Intravenous Every 24 hours 03/26/2021 1954 04/25/21 0731   04/23/21 1200  cefTRIAXone (ROCEPHIN) 1 g in sodium chloride 0.9 % 100 mL IVPB  Status:  Discontinued        1 g 200 mL/hr over 30 Minutes Intravenous Every 24 hours 04/12/2021 1954 04/25/21 0725   04/09/2021 1230  cefTRIAXone (ROCEPHIN) 1 g in sodium chloride 0.9 % 100 mL IVPB        1 g 200 mL/hr over 30 Minutes Intravenous  Once 03/25/2021 1225 04/01/2021 1305   04/17/2021 1230  azithromycin (ZITHROMAX) 500 mg in sodium chloride 0.9 % 250 mL IVPB        500 mg 250 mL/hr over 60 Minutes Intravenous  Once 03/28/2021 1225 04/21/2021 1432      Subjective: Unable to assess given mentation  Objective: Vitals:   04/25/21 1659 04/25/21 1800 04/25/21 1845 04/25/21 1908  BP: (!) 148/57  (!) 152/66   Pulse:    67  Resp:   (!) 40 (!) 25  Temp:  98.2 F (36.8 C)    TempSrc:      SpO2:   94% 93%  Weight:      Height:        Intake/Output Summary (Last 24 hours) at 04/26/2021 1813 Last data filed at 04/26/2021 1700 Gross per 24 hour  Intake 26.53 ml  Output 400 ml  Net -373.47 ml   Filed Weights   04/10/2021 0950 04/23/21 0543  Weight: 61.2 kg 58.5 kg    Examination: General exam: Awake, laying in bed, in nad Respiratory system: Increased respiratory effort, no wheezing Cardiovascular system: regular rate, s1, s2 Gastrointestinal system: Soft, nondistended, positive BS Central nervous system: CN2-12 grossly intact, strength intact Extremities: Perfused, no clubbing Skin: Normal skin turgor, no notable skin lesions seen Psychiatry: unable to assess   Data Reviewed: I have personally reviewed following labs and imaging studies  CBC: Recent Labs   Lab 04/18/2021 0958 04/23/21 0434 04/24/21 0155 04/25/21 0222 04/26/21 0201  WBC 12.8* 9.9 19.1* 13.2* 16.8*  NEUTROABS  --  8.9*  --   --   --   HGB 12.4* 12.4* 11.5* 11.7* 12.5*  HCT 36.2* 36.0* 33.6* 34.1* 37.8*  MCV 89.6 90.0 89.8 90.2 91.5  PLT 303 266 309 204 376*   Basic Metabolic Panel: Recent Labs  Lab 04/01/2021 0958 04/04/2021 1956 04/23/21 0434 04/24/21 0155 04/25/21 0222 04/26/21 0201  NA 134*  --  136 139 140 143  K 4.1  --  3.7 3.9 4.0 3.8  CL 100  --  101 105 109 105  CO2 19*  --  22 23 23 26   GLUCOSE 160*  --  182* 146* 153* 171*  BUN 32*  --  42* 58* 40* 40*  CREATININE 0.98  --  1.19 1.35* 1.02 1.29*  CALCIUM 8.8*  --  9.1 8.9 8.5* 8.9  MG  --  1.8 1.8  --   --   --   PHOS  --   --  3.2  --   --   --    GFR: Estimated Creatinine Clearance: 29 mL/min (A) (by C-G formula based on SCr of 1.29 mg/dL (H)). Liver Function Tests: Recent Labs  Lab 04/04/2021 0958 04/23/21 0434 04/24/21 0155 04/25/21 0222 04/26/21 0201  AST 23 30 43* 34 30  ALT 10 17 23 24 24   ALKPHOS 69 67 62 73 93  BILITOT 0.8 0.6 0.5 0.7 1.3*  PROT 6.0* 6.3* 5.5* 5.1* 5.8*  ALBUMIN 3.3* 2.6* 2.4* 2.3* 2.5*   No results for input(s): LIPASE, AMYLASE in the last 168 hours. No results for input(s): AMMONIA in the last 168 hours. Coagulation Profile: No results for input(s): INR, PROTIME in the last 168 hours. Cardiac Enzymes: No results for input(s): CKTOTAL, CKMB, CKMBINDEX, TROPONINI in the last 168 hours. BNP (last 3 results) No results for input(s): PROBNP in the last 8760 hours. HbA1C: No results for input(s): HGBA1C in the last 72 hours. CBG: Recent Labs  Lab 04/25/21 0937 04/25/21 1839  GLUCAP 233* 167*   Lipid Profile: No results for input(s): CHOL, HDL, LDLCALC, TRIG, CHOLHDL, LDLDIRECT in the last 72 hours. Thyroid Function Tests: No results for input(s): TSH, T4TOTAL, FREET4, T3FREE, THYROIDAB in the last 72 hours. Anemia Panel: No results for input(s):  VITAMINB12, FOLATE, FERRITIN, TIBC, IRON, RETICCTPCT in the last 72 hours. Sepsis Labs: Recent Labs  Lab 04/05/2021 2011 03/23/2021 2208  PROCALCITON 0.18  --   LATICACIDVEN  --  3.0*    Recent Results (from the past 240 hour(s))  Resp Panel by RT-PCR (Flu A&B, Covid) Nasopharyngeal Swab     Status: None   Collection Time: 04/01/2021 10:07 AM   Specimen: Nasopharyngeal Swab; Nasopharyngeal(NP) swabs in vial transport medium  Result Value Ref Range Status   SARS Coronavirus 2 by RT PCR NEGATIVE NEGATIVE Final    Comment: (NOTE) SARS-CoV-2 target nucleic acids are NOT DETECTED.  The SARS-CoV-2 RNA is generally detectable in upper respiratory specimens during the acute phase of infection. The lowest concentration of SARS-CoV-2 viral copies this assay can detect is 138 copies/mL. A negative result does not preclude SARS-Cov-2 infection and should not be used as the sole basis for treatment or other patient management decisions. A negative result may occur with  improper specimen collection/handling, submission of specimen other than nasopharyngeal swab, presence of viral mutation(s) within the areas targeted by this assay, and inadequate number of viral copies(<138 copies/mL). A negative result must be combined with clinical observations, patient history, and epidemiological information. The expected result is Negative.  Fact Sheet for Patients:  EntrepreneurPulse.com.au  Fact Sheet for Healthcare Providers:  IncredibleEmployment.be  This test is no t yet approved or cleared by the [place] FDA and  has been authorized for detection and/or diagnosis of SARS-CoV-2 by FDA under an Emergency Use Authorization (EUA). This EUA will remain  in effect (meaning this test can be used) for the duration of the COVID-19 declaration under Section 564(b)(1) of the Act, 21 U.S.C.section 360bbb-3(b)(1), unless the authorization is terminated  or revoked  sooner.       Influenza A by PCR NEGATIVE NEGATIVE Final   Influenza B by PCR NEGATIVE NEGATIVE Final    Comment: (NOTE) The Xpert Xpress SARS-CoV-2/FLU/RSV plus assay is intended as an aid in the diagnosis of influenza from Nasopharyngeal swab specimens and should  not be used as a sole basis for treatment. Nasal washings and aspirates are unacceptable for Xpert Xpress SARS-CoV-2/FLU/RSV testing.  Fact Sheet for Patients: EntrepreneurPulse.com.au  Fact Sheet for Healthcare Providers: IncredibleEmployment.be  This test is not yet approved or cleared by the [place] FDA and has been authorized for detection and/or diagnosis of SARS-CoV-2 by FDA under an Emergency Use Authorization (EUA). This EUA will remain in effect (meaning this test can be used) for the duration of the COVID-19 declaration under Section 564(b)(1) of the Act, 21 U.S.C. section 360bbb-3(b)(1), unless the authorization is terminated or revoked.  Performed at [facility], [address] [place], [place] [address]   Culture, blood (Routine X 2) w Reflex to ID Panel     Status: None (Preliminary result)   Collection Time: 03/24/2021 10:08 PM   Specimen: BLOOD RIGHT HAND  Result Value Ref Range Status   Specimen Description BLOOD RIGHT HAND  Final   Special Requests   Final    BOTTLES DRAWN AEROBIC AND ANAEROBIC Blood Culture results may not be optimal due to an excessive volume of blood received in culture bottles   Culture   Final    NO GROWTH 4 DAYS Performed at [facility], [place] [address] [place], [place] [address]    Report Status PENDING  Incomplete  Culture, blood (Routine X 2) w Reflex to ID Panel     Status: None (Preliminary result)   Collection Time: 04/09/2021 10:10 PM   Specimen: BLOOD LEFT HAND  Result Value Ref Range Status   Specimen Description BLOOD LEFT HAND  Final   Special Requests   Final    BOTTLES DRAWN AEROBIC AND  ANAEROBIC Blood Culture adequate volume   Culture   Final    NO GROWTH 4 DAYS Performed at [facility], [place] [address], [place], [place] [address]    Report Status PENDING  Incomplete  Resp Panel by RT-PCR (Flu A&B, Covid) Nasopharyngeal Swab     Status: None   Collection Time: 04/24/21 11:13 AM   Specimen: Nasopharyngeal Swab; Nasopharyngeal(NP) swabs in vial transport medium  Result Value Ref Range Status   SARS Coronavirus 2 by RT PCR NEGATIVE NEGATIVE Final    Comment: (NOTE) SARS-CoV-2 target nucleic acids are NOT DETECTED.  The SARS-CoV-2 RNA is generally detectable in upper respiratory specimens during the acute phase of infection. The lowest concentration of SARS-CoV-2 viral copies this assay can detect is 138 copies/mL. A negative result does not preclude SARS-Cov-2 infection and should not be used as the sole basis for treatment or other patient management decisions. A negative result may occur with  improper specimen collection/handling, submission of specimen other than nasopharyngeal swab, presence of viral mutation(s) within the areas targeted by this assay, and inadequate number of viral copies(<138 copies/mL). A negative result must be combined with clinical observations, patient history, and epidemiological information. The expected result is Negative.  Fact Sheet for Patients:  EntrepreneurPulse.com.au  Fact Sheet for Healthcare Providers:  IncredibleEmployment.be  This test is no t yet approved or cleared by the [place] FDA and  has been authorized for detection and/or diagnosis of SARS-CoV-2 by FDA under an Emergency Use Authorization (EUA). This EUA will remain  in effect (meaning this test can be used) for the duration of the COVID-19 declaration under Section 564(b)(1) of the Act, 21 U.S.C.section 360bbb-3(b)(1), unless the authorization is terminated  or revoked sooner.       Influenza A by PCR NEGATIVE  NEGATIVE Final   Influenza B by PCR NEGATIVE NEGATIVE Final    Comment: (NOTE) The Xpert Xpress SARS-CoV-2/FLU/RSV plus assay is intended as an aid in the diagnosis of influenza from Nasopharyngeal swab specimens and should not be used as a sole basis for treatment. Nasal washings and aspirates are unacceptable for Xpert Xpress SARS-CoV-2/FLU/RSV testing.  Fact Sheet for Patients: EntrepreneurPulse.com.au  Fact Sheet for Healthcare Providers: IncredibleEmployment.be  This test is not yet approved or cleared by the [place] FDA and has been authorized for detection and/or diagnosis of SARS-CoV-2 by FDA under an Emergency Use Authorization (EUA). This EUA will remain in effect (meaning this test can be used) for the duration of the COVID-19 declaration under Section 564(b)(1) of the Act, 21 U.S.C. section 360bbb-3(b)(1), unless the authorization is terminated or revoked.  Performed at [facility], [place] [address] [place], [place] [address]   MRSA PCR Screening     Status: None   Collection Time: 04/25/21  6:16 PM   Specimen: Nasopharyngeal  Result Value Ref Range Status   MRSA by PCR NEGATIVE NEGATIVE Final    Comment:        The GeneXpert MRSA Assay (FDA approved for NASAL specimens only), is one component of a comprehensive MRSA colonization surveillance program. It is not intended to diagnose MRSA infection nor to guide or monitor treatment for MRSA infections. Performed at [facility], [place] [address] [place], [place] [address]      Radiology Studies: CT HEAD WO CONTRAST  Result Date: 04/25/2021 CLINICAL DATA:  87 year old male code stroke. EXAM: CT HEAD WITHOUT CONTRAST TECHNIQUE: Contiguous axial images were obtained from the base of the skull through the vertex without intravenous contrast. COMPARISON:  None. FINDINGS: Brain: No midline shift, mass effect, or evidence of intracranial mass lesion. No acute  intracranial hemorrhage identified. No ventriculomegaly. Patchy bilateral white matter hypodensity and evidence of several small chronic cerebellar infarcts. No cortically based acute infarct identified. ASPECTS 10. Vascular: Calcified atherosclerosis at the skull base. Subtle but suspicious asymmetric density of the left ICA terminus and left MCA M1 (series 3, image 16). Skull: Negative. Sinuses/Orbits: Visualized paranasal sinuses and mastoids are clear. Other: Postoperative changes to both globes. No gaze deviation. Visualized scalp soft tissues are within normal limits. IMPRESSION: 1. No acute cortically based infarct or acute intracranial hemorrhage identified but suspicious asymmetric density of the Left ICA terminus and LeftMCA M1. ASPECTS 10. 2. These results were communicated to Dr. Curly Shores at 10:03 am on 04/25/2021 by text page via the [facility] messaging system. 3. Evidence of chronic small vessel disease in the cerebral white matter and the cerebellum. Electronically Signed   By: Genevie Ann M.D.   On: 04/25/2021 10:05   MR BRAIN WO CONTRAST  Result Date: 04/25/2021 CLINICAL DATA:  Acute neuro deficit. EXAM: MRI HEAD WITHOUT CONTRAST TECHNIQUE: Multiplanar, multiecho pulse sequences of the brain and surrounding structures were obtained without intravenous contrast. COMPARISON:  CT angio head and neck 04/25/2021 FINDINGS: Brain: Numerous small areas of restricted diffusion throughout both cerebral hemispheres and both cerebellar hemispheres. Findings compatible with multiple embolic infarctions. Moderate atrophy. Negative for hydrocephalus. Negative for hemorrhage. Question lesion in the right internal auditory canal. This could be volume averaging or vestibular schwannoma. See axial T2 image 7. Vascular: Normal arterial flow voids. Skull and upper cervical spine: No focal skeletal lesion. Sinuses/Orbits: Mild mucosal edema paranasal sinuses. Bilateral cataract extraction Other: None IMPRESSION: Numerous small  areas of acute infarct in both  cerebral hemispheres and both cerebellar hemispheres compatible with numerous emboli. Moderate atrophy. Questionable lesion within the right internal auditory canal on axial T2 images however this could be volume averaging. Correlate with hearing loss on the right. This could be resolved with MRI head with contrast with special attention to the posterior fossa. Electronically Signed   By: Franchot Gallo M.D.   On: 04/25/2021 14:55   US RENAL  Result Date: 04/24/2021 CLINICAL DATA:  87 year old with hematuria. EXAM: RENAL / URINARY TRACT ULTRASOUND COMPLETE COMPARISON:  None. FINDINGS: Right Kidney: Renal measurements: 7.8 x 5.3 x 3.5 cm = volume: 76 mL. There is mild thinning of the renal parenchyma. No hydronephrosis. Lower pole is obscured by shadowing bowel gas. No evidence of focal lesion or stone, the portions of the kidney are not well seen Left Kidney: Renal measurements: 9.6 x 5.3 x 4.7 cm = volume: 109 mL. Normal parenchymal echogenicity. No hydronephrosis. 1.6 cm cyst in the lower kidney. No evidence of solid lesion or stone. Bladder: Not visualized, may be decompressed. Other: None. IMPRESSION: 1. No obstructive uropathy.  No explanation for hematuria. 2. Thinning of the right renal parenchyma with mild atrophy. Lower pole of the right kidney is partially obscured by overlying bowel gas. 3. Urinary bladder not seen, may be decompressed. Electronically Signed   By: Keith Rake M.D.   On: 04/24/2021 18:50   DG CHEST PORT 1 VIEW  Result Date: 04/25/2021 CLINICAL DATA:  Shortness of breath EXAM: PORTABLE CHEST 1 VIEW COMPARISON:  April 25, 2021 study obtained earlier in the day FINDINGS: There is extensive airspace opacity throughout most of the right lung as well as to a slightly lesser extent throughout much of the left lung. Appearance similar study obtained earlier in the day. There are pacemaker leads attached to right atrium right ventricle. The heart size and  pulmonary vascularity are stable and within normal limits. No adenopathy. There is aortic atherosclerosis. There is arthropathy in each shoulder. IMPRESSION: Multifocal airspace opacity, likely due to multifocal pneumonia. A degree of superimposed pulmonary edema is possible. Appearance similar to earlier in the day. Stable cardiac silhouette. Pacemaker leads attached to right atrium and right ventricle. Aortic Atherosclerosis (ICD10-I70.0). Electronically Signed   By: Lowella Grip III M.D.   On: 04/25/2021 17:11   DG Chest Port 1 View  Result Date: 04/25/2021 CLINICAL DATA:  87 year old male with history of respiratory failure. EXAM: PORTABLE CHEST 1 VIEW COMPARISON:  Chest x-ray 04/24/2021. FINDINGS: There continues to be patchy multifocal ill-defined opacities and areas of interstitial prominence throughout the lungs bilaterally (right greater than left), compatible with severe multilobar bilateral pneumonia. Aeration appears similar to slightly worsened. No definite pleural effusions. Pulmonary vasculature does not appear engorged. Heart size is normal. No pneumothorax. Upper mediastinal contours are within normal limits. Aortic atherosclerosis. Left-sided pacemaker device in place with lead tips projecting over the expected location of the right atrium and right ventricle. Atherosclerotic calcifications in the thoracic aorta. IMPRESSION: 1. Severe multilobar bilateral pneumonia again noted with slightly worsened aeration compared to yesterday's examination. 2. Aortic atherosclerosis. Electronically Signed   By: Vinnie Langton M.D.   On: 04/25/2021 05:29   CT ANGIO HEAD NECK W WO CM W PERF (CODE STROKE)  Result Date: 04/25/2021 CLINICAL DATA:  Code stroke follow-up, possible hyperdense left ICA and MCA EXAM: CT ANGIOGRAPHY HEAD AND NECK CT PERFUSION BRAIN TECHNIQUE: Multidetector CT imaging of the head and neck was performed using the standard protocol during bolus administration of intravenous  contrast. Multiplanar CT image reconstructions and MIPs were obtained to evaluate the vascular anatomy. Carotid stenosis measurements (when applicable) are obtained utilizing NASCET criteria, using the distal internal carotid diameter as the denominator. Multiphase CT imaging of the brain was performed following IV bolus contrast injection. Subsequent parametric perfusion maps were calculated using RAPID software. CONTRAST:  4mL OMNIPAQUE IOHEXOL 350 MG/ML SOLN COMPARISON:  Correlation made with prior head CT FINDINGS: CTA NECK FINDINGS Aortic arch: Mixed plaque along the arch with partial visualization of aneurysm better seen on recent CT chest. Great vessel origins are patent. Right carotid system: Patent. Mild mixed plaque at the ICA origin without stenosis. Left carotid system: Patent. Mild calcified plaque at the ICA origin causing minimal stenosis. Vertebral arteries: Patent. Left vertebral artery is dominant. No stenosis. Skeleton: Advanced degenerative changes of the cervical spine. Degenerative changes of the left temporomandibular joint. Other neck: Unremarkable. Upper chest: Increased ground-glass density, patchy consolidation, and interlobular septal thickening in the included upper lobes. Review of the MIP images confirms the above findings CTA HEAD FINDINGS Anterior circulation: Intracranial internal carotid arteries are patent. Calcified plaque is present causing mild to moderate stenosis in the right paraclinoid region. Anterior cerebral arteries are patent with anterior communicating artery present. Middle cerebral arteries are patent. Posterior circulation: Intracranial vertebral arteries are patent with calcified plaque causing mild stenosis. Basilar artery is patent. Major cerebellar artery origins are patent. Posterior cerebral arteries are patent. Venous sinuses: As permitted by contrast timing, patent. Review of the MIP images confirms the above findings CT Brain Perfusion Findings: CBF  (<30%) Volume: 53mL Perfusion (Tmax>6.0s) volume: 30mL Mismatch Volume: 8mL Infarction Location: None. IMPRESSION: No large vessel occlusion, hemodynamically significant stenosis, or evidence of dissection. Perfusion imaging demonstrates no evidence of core infarction or penumbra. Progression of ground-glass density and consolidation in the included upper lobes. Initial results were communicated to Dr. Curly Shores at 10:37 am on 04/25/2021 by text page via the [facility] messaging system. Electronically Signed   By: Macy Mis M.D.   On: 04/25/2021 10:48    Scheduled Meds: . antiseptic oral rinse  15 mL Topical BID  . furosemide  40 mg Intravenous Daily  . sodium chloride  1 spray Each Nare Q4H   Continuous Infusions: . morphine 5 mg/hr (04/26/21 1633)     LOS: 4 days   Marylu Lund, MD [facility] Pager On Amion  If 7PM-7AM, please contact night-coverage 04/26/2021, 6:13 PM

## 2021-04-26 NOTE — Progress Notes (Signed)
Situation: Chaplain Medinas-Lockley responding to referral from family for pt William [name].  Background: Facts: RN shared that William [name] is at end of life. William [name] seemed conscious but non-responsive during today's visit. Family: Mr. Sonda Primes son, Ed, was present. Feelings: William [name] seemed unable to express emotions at this time. Mr. Jaquita Rector expressed sadness, understanding, and thankfulness, sharing "he's been a good man and a good father." Faith: Mr. Jaquita Rector shared that William [name] is a member of [employer], and that he would appreciate having "last rites" if a priest is available; however, he also shared "I think we know where he's going, and he's probably had Last Rites done already, so it's okay if we can't get a priest." Mr. 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 William [name] and Mr. Jaquita Rector seem to be a peace with the situation, both grateful if a priest can come and content if not, expressing no other needs at this time.  Recommendations: For Chaplains: Provide Father Barnabas Lister with appropriate information as needed for continued faith-specific rituals desired by pt/family. For Staff: Be cognizant of changing needs as pt transitions to end of life and family transitions from anticipatory grief to actual grief. Page chaplain as needed to coordinate community clergy care.  Chaplain remains available for follow-up spiritual/emotional support as needed.  Rev. Susanne Borders, MDiv     04/26/21 1100  Clinical Encounter Type  Visited With Patient and family together  Visit Type Patient actively dying;Spiritual support  Referral From Family  Consult/Referral To Faith community   Spiritual Encounters  Spiritual Needs Ritual

## 2021-04-26 NOTE — Consult Note (Signed)
Consultation Note Date: 04/26/2021   Patient Name: William [name]  DOB: 1924-03-05  MRN: [id number]  Age / Sex: 87 y.o., male  PCP: Merri Brunette, MD Referring Physician: Jerald Kief, MD  Reason for Consultation: Establishing goals of care  HPI/Patient Profile: 87 y.o. male  with past medical history of afib on anticoagulation and AAA s/p repair admitted on 04/21/2021 with respiratory failure and found to  Have bilateral pneumonia. Found to have elevated d dimer.During admission patient c/o flaccid L arm. MRI obtained and revealed numerous small areas of acute infarct in both cerebral hemispheres and both cerebellar hemispheres compatible with numerous emboli. Evening of 6/3 and morning of 6/4 patient with increased respiratory distress and required bipap. Troponin found to be markedly elevated at 1826 followed by 2105. Patient minimally arousable. Now requiring 14 L HFNC. PMT consulted to discuss GOC.  Clinical Assessment and Goals of Care: I have reviewed medical records including EPIC notes, labs and imaging, received report from RN, assessed the patient and then met with patient's son and daughter  to discuss diagnosis prognosis, GOC, EOL wishes, disposition and options. Patient's eyes are open but he does not interact or respond to stimuli.  I introduced Palliative Medicine as specialized medical care for people living with serious illness. It focuses on providing relief from the symptoms and stress of a serious illness. The goal is to improve quality of life for both the patient and the family.  Son and daughter share patient was living independently prior to hospitalization. His decline has been rapid and shocking.    We discussed patient's current illness and what it means in the larger context of patient's on-going co-morbidities.  Natural disease trajectory and expectations at EOL were discussed. We discussed his bilateral pneumonia, new  strokes, and now MI. We discuss his AMS.  I attempted to elicit values and goals of care important to the patient.  Family is clear patient would not want his life prolonged given his current illness and poor prognosis.  The difference between aggressive medical intervention and comfort care was considered in light of the patient's goals of care. Daughter and son both request a transition to comfort measures only.  We discuss options for care: home with hospice, hospice facility, of comfort care in the hospital. Discussed pros and cons of each. Shared my concerns about patient high oxygen requirement and that time may be very short as we wean down his oxygen. After extensive discussion with family and bedside RN decision was made to keep patient in the hospital for end of life care - family does not feel they can provide level of care he needs at home and they worry how he will tolerate a transfer to hospice facility especially with his high o2 requirement. We discussed a plan to initiate a morphine infusion and titrate his oxygen down as he becomes more comfortable with medication administration.   Questions and concerns were addressed. The family was encouraged to call with questions or concerns.   Primary Decision Maker NEXT OF KIN - son and daughter  SUMMARY OF RECOMMENDATIONS   - comfort measures only - all measures not needed to ensure comfort dc'd - initiate morphine infusion and wean oxygen off, PRN ativan haldol and robinul made available - dc all monitoring devices - anticipate hospital death  Code Status/Advance Care Planning:  DNR  Additional Recommendations (Limitations, Scope, Preferences):  Full Comfort Care  Prognosis:   Hours - Days  Discharge Planning: Anticipated Hospital Death  Primary Diagnoses: Present on Admission: . Respiratory failure with hypoxia (HCC) . SOB (shortness of breath) . Severe sepsis (HCC) . CAP (community acquired pneumonia) .  Elevated troponin . Thoracic aortic aneurysm (TAA) (HCC) . Paroxysmal atrial fibrillation (HCC) . Essential hypertension   I have reviewed the medical record, interviewed the patient and family, and examined the patient. The following aspects are pertinent.  Past Medical History:  Diagnosis Date  . AAA (abdominal aortic aneurysm) (HCC)   . Atrioventricular block   . Cardiac pacemaker 05/09/10   medtronic dual chamber  . Dyslipidemia   . Nonsustained ventricular tachycardia (HCC)   . Paroxysmal atrial fibrillation (HCC)    on Eliquis   Social History   Socioeconomic History  . Marital status: Married    Spouse name: Not on file  . Number of children: Not on file  . Years of education: Not on file  . Highest education level: Not on file  Occupational History  . Not on file  Tobacco Use  . Smoking status: Former Smoker    Quit date: 11/22/1974    Years since quitting: 46.4  . Smokeless tobacco: Never Used  Vaping Use  . Vaping Use: Never used  Substance and Sexual Activity  . Alcohol use: Yes    Alcohol/week: 7.0 standard drinks    Types: 7 Standard drinks or equivalent per week  . Drug use: No  . Sexual activity: Not Currently  Other Topics Concern  . Not on file  Social History Narrative  . Not on file   Social Determinants of Health   Financial Resource Strain: Not on file  Food Insecurity: Not on file  Transportation Needs: Not on file  Physical Activity: Not on file  Stress: Not on file  Social Connections: Not on file   History reviewed. No pertinent family history. Scheduled Meds: . antiseptic oral rinse  15 mL Topical BID  . furosemide  40 mg Intravenous Daily  . sodium chloride  1 spray Each Nare Q4H   Continuous Infusions: . morphine 3 mg/hr (04/26/21 1026)   PRN Meds:.acetaminophen **OR** acetaminophen, glycopyrrolate **OR** glycopyrrolate **OR** glycopyrrolate, haloperidol **OR** haloperidol **OR** haloperidol lactate, LORazepam **OR**  LORazepam **OR** LORazepam, morphine injection, morphine, ondansetron **OR** ondansetron (ZOFRAN) IV, polyvinyl alcohol Allergies  Allergen Reactions  . Statins Other (See Comments)    Myalgia: Rosuvastatin, Atorvastatin, Simvastatin   Review of Systems  Unable to perform ROS: Mental status change    Physical Exam Constitutional:      Comments: Eyes open but does not respond to stimuli  Pulmonary:     Comments: slightly labored with increased respiratory rate Skin:    General: Skin is warm and dry.     Vital Signs: BP (!) 152/66   Pulse 67   Temp 98.2 F (36.8 C)   Resp (!) 25   Ht 5\' 8"  (1.727 m)   Wt 58.5 kg   SpO2 93%   BMI 19.61 kg/m  Pain Scale: 0-10   Pain Score: 8    SpO2: SpO2: 93 % O2 Device:SpO2: 93 % O2 Flow Rate: .O2 Flow Rate (L/min): 12 L/min  IO: Intake/output summary:   Intake/Output Summary (Last 24 hours) at 04/26/2021 1030 Last data filed at 04/26/2021 0611 Gross per 24 hour  Intake 1318.48 ml  Output 2130 ml  Net -811.52 ml    LBM: Last BM Date: 04/21/2021 Baseline Weight: Weight: 61.2 kg Most recent weight: Weight: 58.5 kg     Palliative Assessment/Data: PPS 10%  Time Total: 90 minutes Greater than 50%  of this time was spent counseling and coordinating care related to the above assessment and plan.  Juel Burrow, DNP, AGNP-C Palliative Medicine Team [phone] Pager: [phone]

## 2021-04-26 NOTE — Progress Notes (Addendum)
Chief Complaint: unable  Brief interval history: William [name] is an 87 y.o. male w/pmh afib on anticoagulation, AAA s/p repair who initially presented with hypoxemic respiratory failure, found to have B PNA on CT chest with leukocytosis. On 04/25/21 at 929 AM he was noted to have left arm weakness by occupational therapy for which a code stroke was initiated, no acute treatments for stroke were done. He has continued to decline since admit and family wishes to move to [facility]/hospice at this time.     Past Medical History:  Diagnosis Date  . AAA (abdominal aortic aneurysm) ([place])   . Atrioventricular block   . Cardiac pacemaker 05/09/10   medtronic dual chamber  . Dyslipidemia   . Nonsustained ventricular tachycardia ([place])   . Paroxysmal atrial fibrillation (HCC)    on Eliquis    Past Surgical History:  Procedure Laterality Date  . ABDOMINAL AORTIC ANEURYSM REPAIR  08/02/08   Dr. Donnetta Hutching  . MENISCUS REPAIR     both knees  . PPM GENERATOR CHANGEOUT N/A 08/03/2018   Procedure: PPM GENERATOR CHANGEOUT;  Surgeon: Sanda Klein, MD;  Location: [facility];  Service: Cardiovascular;  Laterality: N/A;  . RETINAL TEAR REPAIR CRYOTHERAPY      History reviewed. No pertinent family history.       Social History:  reports that he quit smoking about 46 years ago. He has never used smokeless tobacco. He reports current alcohol use of about 7.0 standard drinks of alcohol per week. He reports that he does not use drugs.  Allergies:  Allergies  Allergen Reactions  . Statins Other (See Comments)    Myalgia: Rosuvastatin, Atorvastatin, Simvastatin    Medications: I have reviewed the patient's current medications.  ROS: Unable; pt not able to respond  General Examination: Constitutional: Frail elderly caucasian male resting in bed; mild-mod distress noted Cardiovascular: RRR Respiratory: Increased work of breathing, nasal cannula in place w/high flow 02  Neurological  Examination Mental Status: Alert, but does not attend or follow commands. Moans only Cranial Nerves: PERRL, gaze midline,but did not track, face not well activated.  Motor/Sensory: some brief spontanious moveemnt in right arm only seen. Limited out of respect for GOC/CMO.   Pertinent Labs and Imaging:   Blood cultures from admission negative to date Creatinine 1.02, GFR greater than 60 Leukocytosis to 13.2  CT Head 04/25/21 1. No acute cortically based infarct or acute intracranial hemorrhage identified but suspicious asymmetric density of the Left ICA terminus and LeftMCA M1. ASPECTS 10.  CTA Head and Neck + CT Perfusion 04/25/21 No large vessel occlusion, hemodynamically significant stenosis, or evidence of dissection.  Perfusion imaging demonstrates no evidence of core infarction or penumbra.  Progression of ground-glass density and consolidation in the included upper lobes.  Assessment:  William [name] is a 87 year old male w/pmh of afib on anticoagulation, AAA s/p repair who initially presented with hypoxemic respiratory failure. Neurology was consulted for a code stroke due to acute onset left arm weakness that patient noted at 230AM on 04/25/21 with LKW the night prior at 8PM-9PM. Unfortunately he was outside the time window for IVTPA.   During initial code stroke his neuro examination fluctuated; he was noted to have a left inferior quadrantanopsia which then improved. LUE weakness worsened as the code stroke continued. Vessel imaging with CTA Head and Neck was negative for LVO. CT Head with no acute stroke. Given lack of LVO he was not a candidate for thrombectomy.   -MRI confirmed scattered  bilateral strokes in watershed distributions. Etiology is hypo-perfusion in setting of sepsis and cardieoembolic d/t afib with low dose Eliquis.    After d/w family this am, the pt continues to decline and already has DNR/DRI. The son at bedside request hospice care at this time and no further  work up or testing.  Recommendations:  -Supportive care -Agree with family decision to move to [place] at this time -DNR/DNI -We will s/o. Please call back if needed.   William [name], ARNP-C, ANVP-BC Pager: [phone] 04/26/2021, 10:47 AM

## 2021-04-27 LAB — CULTURE, BLOOD (ROUTINE X 2)
Culture: NO GROWTH
Culture: NO GROWTH
Special Requests: ADEQUATE

## 2021-05-23 NOTE — Progress Notes (Signed)
HOSPITAL MEDICINE OVERNIGHT EVENT NOTE    Notified by nursing that patient became unresponsive and eventually lost his pulse.  2 RN's evaluated the patient and pronounced him at 1:15AM.  Family (son and daughter) were at bedside at time of pronouncement.   Patient was a comfort measures patient.  Mortality services will be notified.  Vernelle Emerald  MD [facility]

## 2021-05-23 NOTE — Discharge Summary (Signed)
Death Summary  William [name] DOB: [birth year] DOA: 05-02-21  PCP: Deland Pretty, MD  Admit date: 05/02/21 Date of Death: 2021-05-07 Time of Death: 0115 Notification: Deland Pretty, MD notified of death of 2021/05/07   History of present illness:  Patient was a 87yo with hx afib on anticoagulation, AAA s/p repair presented with hypoxemic respiratory failure, found to have bilateral multifocal PNA on CT chest with leukocytosis. Patient was started on empiric antibiotics. The Hospitalist service was consulted for consideration for admission.  Final Diagnoses:  Acute hypoxic respiratory failure secondary to sepsis with bilateral pneumonia present on admit -The patient initially presented needing 6LNC and was originally O2 naive -despite broad spectrum antibiotics, trials of IV lasix, O2 requirements progressively worsened to needing bipap support -Serial chest xrays were reviewed, findings were worrisome for worsening pneumonia -Covid confirmed and re-confirmed negative -CT chest was negative for PE - Given rapid decompensation and progressive multisystem worsening, decision was made to transition to comfort status  Elevated d.dimer -Presenting D. Dimer in excess of 12.3 -CT chest reviewed, no evidence of PE noted -later transitioned to comfort care status  NSTEMI, confirmed -mildly elevated initial troponin of86 at time of presentation, with repeat value demonstrating slight interval trend up to 100 -during this admit, pt's O2 requirements worsened and pt developed acute chest pains -Repeat troponins were later noted to be 1826 with repeat February 19, 2104 -With worsening multisystem failures, decision was made to transition to comfort status per family  Thoracic aortic aneurysm:  -Noted on presenting CTA chest, with maximum dimensions 4.5 x 3.9 cm. No radiographic or clinical evidence to suggest dissection at this time.  -Radiology had recommended semiannual imaging  follow-up by CTA or MRA. -Patient was transitioned to comfort status  Paroxysmal atrial fibrillation:  -Documented history of such,including a history of complication by sick sinus syndrome prompting pacemaker placement in 02-18-10.  -CHA2DS2-VASc score of3, had been continued on eliquis -Patient was later transitioned to comfort measures only  Hypertension:  -Outpatient hypertensive regimen includes lisinopril, HCTZ, and Toprol XL. -lisinopril and HCTZ initially held given presenting sepsis -Pt is now comfort status only, per below  Hematuria -Painless hematuria noted per pt -UA is pos for large blood -Renal US reviewed, unremarkable. Did not order CT secondary to concerns of renal insufficiency -Hgb had remained stable this visit -now comfort measures only  Multiple acute embolic bilateral CVA confirmed -New findings of L arm being flaccid  -Discussed with Neurology and code stroke activated -MRI reviewed with family. Confirmed multiple bilateral embolic CVA -Given rapid decompensation, decision was made to transition to comfort status only  End of Life, DNR, Comfort Care -Per family, patient's wishes were noted to be DNR/DNI -Appreciate input by Palliative Care -Given multisystem failures with progressive and rapid decline, patient's care was transitioned to full comfort care -Patient was pronounced 2021/05/07 at 0115   The results of significant diagnostics from this hospitalization (including imaging, microbiology, ancillary and laboratory) are listed below for reference.    Significant Diagnostic Studies: CT HEAD WO CONTRAST  Result Date: 04/25/2021 CLINICAL DATA:  87 year old male code stroke. EXAM: CT HEAD WITHOUT CONTRAST TECHNIQUE: Contiguous axial images were obtained from the base of the skull through the vertex without intravenous contrast. COMPARISON:  None. FINDINGS: Brain: No midline shift, mass effect, or evidence of intracranial mass lesion. No acute  intracranial hemorrhage identified. No ventriculomegaly. Patchy bilateral white matter hypodensity and evidence of several small chronic cerebellar infarcts. No cortically based acute infarct identified. ASPECTS 10. Vascular:  Calcified atherosclerosis at the skull base. Subtle but suspicious asymmetric density of the left ICA terminus and left MCA M1 (series 3, image 16). Skull: Negative. Sinuses/Orbits: Visualized paranasal sinuses and mastoids are clear. Other: Postoperative changes to both globes. No gaze deviation. Visualized scalp soft tissues are within normal limits. IMPRESSION: 1. No acute cortically based infarct or acute intracranial hemorrhage identified but suspicious asymmetric density of the Left ICA terminus and LeftMCA M1. ASPECTS 10. 2. These results were communicated to Dr. Curly Shores at 10:03 am on 04/25/2021 by text page via the [facility] messaging system. 3. Evidence of chronic small vessel disease in the cerebral white matter and the cerebellum. Electronically Signed   By: Genevie Ann M.D.   On: 04/25/2021 10:05   CT Angio Chest PE W and/or Wo Contrast  Result Date: 04/17/2021 CLINICAL DATA:  PE suspected, shortness of breath, cough, productive sputum EXAM: CT ANGIOGRAPHY CHEST WITH CONTRAST TECHNIQUE: Multidetector CT imaging of the chest was performed using the standard protocol during bolus administration of intravenous contrast. Multiplanar CT image reconstructions and MIPs were obtained to evaluate the vascular anatomy. CONTRAST:  50mL OMNIPAQUE IOHEXOL 350 MG/ML SOLN COMPARISON:  None. FINDINGS: Cardiovascular: Satisfactory opacification of the pulmonary arteries to the segmental level. No evidence of pulmonary embolism. Cardiomegaly. Left coronary artery calcifications. Left chest multi lead pacer. No pericardial effusion. Aortic atherosclerosis. Focal aneurysm of the proximal, left aspect of the aortic arch, maximum dimensions 4.5 x 3.9 cm (series 7, image 77, series 8, image 96).  Mediastinum/Nodes: No enlarged mediastinal, hilar, or axillary lymph nodes. Thyroid gland, trachea, and esophagus demonstrate no significant findings. Lungs/Pleura: Trace bilateral pleural effusions and associated atelectasis or consolidation. Diffuse, somewhat geographic ground-glass airspace opacity throughout the lungs, with mild interlobular septal thickening. There is some degree of underlying fibrotic interstitial opacity, poorly characterized. Upper Abdomen: No acute abnormality. Musculoskeletal: No chest wall abnormality. No acute or significant osseous findings. Review of the MIP images confirms the above findings. IMPRESSION: 1. Negative examination for pulmonary embolism. 2. Trace bilateral pleural effusions and associated atelectasis or consolidation. 3. Diffuse, somewhat geographic ground-glass airspace opacity throughout the lungs, with mild interlobular septal thickening. Findings are most consistent with infection. 4. There is some degree of underlying fibrotic interstitial opacity, poorly characterized. This could be further evaluated on a nonemergent basis by ILD protocol CT examination of the chest at the resolution of acute presentation. 5. Cardiomegaly and coronary artery disease. 6. Focal aneurysm of the proximal, left aspect of the aortic arch, maximum dimensions 4.5 x 3.9 cm. Recommend semi-annual imaging followup by CTA or MRA and referral to cardiothoracic surgery if not already obtained. This recommendation follows 2010 ACCF/AHA/AATS/ACR/ASA/SCA/SCAI/SIR/STS/SVM Guidelines for the Diagnosis and Management of Patients With Thoracic Aortic Disease. Circulation. 2010; 121: K938-H82. Aortic aneurysm NOS (ICD10-I71.9) Aortic Atherosclerosis (ICD10-I70.0). Electronically Signed   By: Eddie Candle M.D.   On: 04/11/2021 12:11   MR BRAIN WO CONTRAST  Result Date: 04/25/2021 CLINICAL DATA:  Acute neuro deficit. EXAM: MRI HEAD WITHOUT CONTRAST TECHNIQUE: Multiplanar, multiecho pulse sequences of  the brain and surrounding structures were obtained without intravenous contrast. COMPARISON:  CT angio head and neck 04/25/2021 FINDINGS: Brain: Numerous small areas of restricted diffusion throughout both cerebral hemispheres and both cerebellar hemispheres. Findings compatible with multiple embolic infarctions. Moderate atrophy. Negative for hydrocephalus. Negative for hemorrhage. Question lesion in the right internal auditory canal. This could be volume averaging or vestibular schwannoma. See axial T2 image 7. Vascular: Normal arterial flow voids. Skull and upper cervical spine:  No focal skeletal lesion. Sinuses/Orbits: Mild mucosal edema paranasal sinuses. Bilateral cataract extraction Other: None IMPRESSION: Numerous small areas of acute infarct in both cerebral hemispheres and both cerebellar hemispheres compatible with numerous emboli. Moderate atrophy. Questionable lesion within the right internal auditory canal on axial T2 images however this could be volume averaging. Correlate with hearing loss on the right. This could be resolved with MRI head with contrast with special attention to the posterior fossa. Electronically Signed   By: Franchot Gallo M.D.   On: 04/25/2021 14:55   US RENAL  Result Date: 04/24/2021 CLINICAL DATA:  87 year old with hematuria. EXAM: RENAL / URINARY TRACT ULTRASOUND COMPLETE COMPARISON:  None. FINDINGS: Right Kidney: Renal measurements: 7.8 x 5.3 x 3.5 cm = volume: 76 mL. There is mild thinning of the renal parenchyma. No hydronephrosis. Lower pole is obscured by shadowing bowel gas. No evidence of focal lesion or stone, the portions of the kidney are not well seen Left Kidney: Renal measurements: 9.6 x 5.3 x 4.7 cm = volume: 109 mL. Normal parenchymal echogenicity. No hydronephrosis. 1.6 cm cyst in the lower kidney. No evidence of solid lesion or stone. Bladder: Not visualized, may be decompressed. Other: None. IMPRESSION: 1. No obstructive uropathy.  No explanation for  hematuria. 2. Thinning of the right renal parenchyma with mild atrophy. Lower pole of the right kidney is partially obscured by overlying bowel gas. 3. Urinary bladder not seen, may be decompressed. Electronically Signed   By: Keith Rake M.D.   On: 04/24/2021 18:50   DG CHEST PORT 1 VIEW  Result Date: 04/25/2021 CLINICAL DATA:  Shortness of breath EXAM: PORTABLE CHEST 1 VIEW COMPARISON:  April 25, 2021 study obtained earlier in the day FINDINGS: There is extensive airspace opacity throughout most of the right lung as well as to a slightly lesser extent throughout much of the left lung. Appearance similar study obtained earlier in the day. There are pacemaker leads attached to right atrium right ventricle. The heart size and pulmonary vascularity are stable and within normal limits. No adenopathy. There is aortic atherosclerosis. There is arthropathy in each shoulder. IMPRESSION: Multifocal airspace opacity, likely due to multifocal pneumonia. A degree of superimposed pulmonary edema is possible. Appearance similar to earlier in the day. Stable cardiac silhouette. Pacemaker leads attached to right atrium and right ventricle. Aortic Atherosclerosis (ICD10-I70.0). Electronically Signed   By: Lowella Grip III M.D.   On: 04/25/2021 17:11   DG Chest Port 1 View  Result Date: 04/25/2021 CLINICAL DATA:  87 year old male with history of respiratory failure. EXAM: PORTABLE CHEST 1 VIEW COMPARISON:  Chest x-ray 04/24/2021. FINDINGS: There continues to be patchy multifocal ill-defined opacities and areas of interstitial prominence throughout the lungs bilaterally (right greater than left), compatible with severe multilobar bilateral pneumonia. Aeration appears similar to slightly worsened. No definite pleural effusions. Pulmonary vasculature does not appear engorged. Heart size is normal. No pneumothorax. Upper mediastinal contours are within normal limits. Aortic atherosclerosis. Left-sided pacemaker device in  place with lead tips projecting over the expected location of the right atrium and right ventricle. Atherosclerotic calcifications in the thoracic aorta. IMPRESSION: 1. Severe multilobar bilateral pneumonia again noted with slightly worsened aeration compared to yesterday's examination. 2. Aortic atherosclerosis. Electronically Signed   By: Vinnie Langton M.D.   On: 04/25/2021 05:29   DG CHEST PORT 1 VIEW  Result Date: 04/24/2021 CLINICAL DATA:  Pneumonia EXAM: PORTABLE CHEST 1 VIEW COMPARISON:  Apr 22, 2021 chest radiograph and chest CT FINDINGS: Airspace opacity remains  throughout much of the right lung as well as in a more patchy distribution on the left. There are scattered areas of scarring with apparent degree of underlying fibrosis. Heart is mildly enlarged with pulmonary vascularity within normal limits. Pacemaker leads are attached the right atrium and right ventricle. No adenopathy. There is aortic atherosclerosis. There is arthropathy in each shoulder. IMPRESSION: Multifocal airspace opacity persists bilaterally, more severe on the right than on the left. Suspect multifocal pneumonia superimposed on a degree of underlying scarring/fibrosis. No appreciable consolidation. Atypical organism pneumonia could present in this manner. Check of COVID-19 status advised. Stable cardiac silhouette. Pacemaker leads attached to right atrium and right ventricle. Aortic Atherosclerosis (ICD10-I70.0). Electronically Signed   By: Lowella Grip III M.D.   On: 04/24/2021 08:21   DG Chest Port 1 View  Result Date: 03/30/2021 CLINICAL DATA:  Dyspnea EXAM: PORTABLE CHEST 1 VIEW COMPARISON:  2011 FINDINGS: Bilateral interstitial opacities. No significant pleural effusion. No pneumothorax. Cardiomediastinal contours are within normal limits with normal heart size. There is atherosclerotic calcification along the thoracic aorta. Left chest wall dual lead pacemaker present. Advanced degenerative changes at the left  glenohumeral joint. IMPRESSION: Bilateral interstitial opacities may reflect atypical/viral pneumonia or edema in the appropriate setting. Electronically Signed   By: Macy Mis M.D.   On: 03/27/2021 10:18   ECHOCARDIOGRAM COMPLETE  Result Date: 04/23/2021    ECHOCARDIOGRAM REPORT   Patient Name:   HYMEN ARNETT Date of Exam: 04/23/2021 Medical Rec #:  [id number]       Height:       68.0 in Accession #:    [id number]      Weight:       129.0 lb Date of Birth:  Nov 03, 1924        BSA:          1.696 m Patient Age:    40 years        BP:           122/64 mmHg Patient Gender: M               HR:           87 bpm. Exam Location:  Inpatient Procedure: 2D Echo, Cardiac Doppler, Color Doppler and Intracardiac            Opacification Agent Indications:    Elevated troponin  History:        Patient has prior history of Echocardiogram examinations, most                 recent 10/23/2011. Pacemaker, Arrythmias:Atrial Fibrillation,                 Signs/Symptoms:Shortness of Breath; Risk Factors:Hypertension                 and Former Smoker. Hx sick sinus syndrome. AAA repair (2009).  Sonographer:    Clayton Lefort RDCS (AE) Referring Phys: [id number] Rhetta Mura  Sonographer Comments: Suboptimal apical window and suboptimal subcostal window. Patient unable to move in LLD position due to unrepair torn rotator cuff. IMPRESSIONS  1. Left ventricular ejection fraction, by estimation, is 60 to 65%. The left ventricle has normal function. The left ventricle has no regional wall motion abnormalities. There is mild left ventricular hypertrophy. Left ventricular diastolic parameters are indeterminate.  2. Right ventricular systolic function is mildly reduced. The right ventricular size is normal. A device lead is visualized in the RA and RV, and appears highly mobile at the  level of the tricuspid valve. There is normal pulmonary artery systolic pressure. The estimated right ventricular systolic pressure is 40.1 mmHg.  3. Left  atrial size was mildly dilated.  4. The mitral valve is normal in structure. Mild mitral valve regurgitation. No evidence of mitral stenosis.  5. The aortic valve is abnormal. There is moderate calcification of the aortic valve. Aortic valve regurgitation is not visualized. No aortic stenosis is present.  6. The inferior vena cava is normal in size with greater than 50% respiratory variability, suggesting right atrial pressure of 3 mmHg. FINDINGS  Left Ventricle: Left ventricular ejection fraction, by estimation, is 60 to 65%. The left ventricle has normal function. The left ventricle has no regional wall motion abnormalities. Definity contrast agent was given IV to delineate the left ventricular  endocardial borders. The left ventricular internal cavity size was normal in size. There is mild left ventricular hypertrophy. Left ventricular diastolic parameters are indeterminate. Right Ventricle: The right ventricular size is normal. No increase in right ventricular wall thickness. Right ventricular systolic function is mildly reduced. There is normal pulmonary artery systolic pressure. The tricuspid regurgitant velocity is 2.79 m/s, and with an assumed right atrial pressure of 3 mmHg, the estimated right ventricular systolic pressure is 02.7 mmHg. Left Atrium: Left atrial size was mildly dilated. Right Atrium: Right atrial size was normal in size. Pericardium: There is no evidence of pericardial effusion. Mitral Valve: The mitral valve is normal in structure. Mild mitral valve regurgitation. No evidence of mitral valve stenosis. Tricuspid Valve: The tricuspid valve is normal in structure. Tricuspid valve regurgitation is mild . No evidence of tricuspid stenosis. Aortic Valve: The aortic valve is abnormal. There is moderate calcification of the aortic valve. Aortic valve regurgitation is not visualized. No aortic stenosis is present. Aortic valve mean gradient measures 3.0 mmHg. Aortic valve peak gradient measures 5.3  mmHg. Aortic valve area, by VTI measures 1.67 cm. Pulmonic Valve: The pulmonic valve was normal in structure. Pulmonic valve regurgitation is not visualized. No evidence of pulmonic stenosis. Aorta: The aortic root is normal in size and structure. Venous: The inferior vena cava is normal in size with greater than 50% respiratory variability, suggesting right atrial pressure of 3 mmHg. IAS/Shunts: No atrial level shunt detected by color flow Doppler. Additional Comments: A device lead is visualized in the and appears highly mobile at the level of the tricuspid valve, right atrium and right ventricle.  LEFT VENTRICLE PLAX 2D LVIDd:         4.00 cm LVIDs:         2.70 cm LV PW:         1.30 cm LV IVS:        1.10 cm LVOT diam:     2.30 cm LV SV:         38 LV SV Index:   23 LVOT Area:     4.15 cm  RIGHT VENTRICLE RV S prime:     14.00 cm/s TAPSE (M-mode): 2.5 cm LEFT ATRIUM           Index LA diam:      3.80 cm 2.24 cm/m LA Vol (A2C): 16.0 ml 9.43 ml/m LA Vol (A4C): 70.0 ml 41.27 ml/m  AORTIC VALVE AV Area (Vmax):    1.95 cm AV Area (Vmean):   1.96 cm AV Area (VTI):     1.67 cm AV Vmax:           115.20 cm/s AV Vmean:  77.620 cm/s AV VTI:            0.229 m AV Peak Grad:      5.3 mmHg AV Mean Grad:      3.0 mmHg LVOT Vmax:         54.06 cm/s LVOT Vmean:        36.560 cm/s LVOT VTI:          0.092 m LVOT/AV VTI ratio: 0.40  AORTA Ao Root diam: 3.60 cm Ao Asc diam:  2.80 cm TRICUSPID VALVE TR Peak grad:   31.1 mmHg TR Vmax:        279.00 cm/s  SHUNTS Systemic VTI:  0.09 m Systemic Diam: 2.30 cm Cherlynn Kaiser MD Electronically signed by Cherlynn Kaiser MD Signature Date/Time: 04/23/2021/11:27:46 PM    Final    VAS [place] LOWER EXTREMITY VENOUS (DVT)  Result Date: 04/24/2021  Lower Venous DVT Study Patient Name:  COULTON SCHLINK  Date of Exam:   04/24/2021 Medical Rec #: [id number]        Accession #:    [id number] Date of Birth: [birth year]         Patient Gender: M Patient Age:   53Y Exam Location:  [facility] Procedure:      VAS [place] LOWER EXTREMITY VENOUS (DVT) Referring Phys: [id number] Nella Botsford K Erasto Sleight --------------------------------------------------------------------------------  Indications: SOB, and Elevated D-Dimer, pneumonia.  Comparison Study: No prior study on file Performing Technologist: Sharion Dove RVS  Examination Guidelines: A complete evaluation includes B-mode imaging, spectral Doppler, color Doppler, and power Doppler as needed of all accessible portions of each vessel. Bilateral testing is considered an integral part of a complete examination. Limited examinations for reoccurring indications may be performed as noted. The reflux portion of the exam is performed with the patient in reverse Trendelenburg.  +---------+---------------+---------+-----------+----------+--------------+ RIGHT    CompressibilityPhasicitySpontaneityPropertiesThrombus Aging +---------+---------------+---------+-----------+----------+--------------+ CFV      Full           Yes      Yes                                 +---------+---------------+---------+-----------+----------+--------------+ SFJ      Full                                                        +---------+---------------+---------+-----------+----------+--------------+ FV Prox  Full                                                        +---------+---------------+---------+-----------+----------+--------------+ FV Mid   Full                                                        +---------+---------------+---------+-----------+----------+--------------+ FV DistalFull                                                        +---------+---------------+---------+-----------+----------+--------------+  PFV      Full                                                        +---------+---------------+---------+-----------+----------+--------------+ POP      Full           Yes      Yes                                  +---------+---------------+---------+-----------+----------+--------------+ PTV      Full                                                        +---------+---------------+---------+-----------+----------+--------------+ PERO     Full                                                        +---------+---------------+---------+-----------+----------+--------------+   +---------+---------------+---------+-----------+----------+--------------+ LEFT     CompressibilityPhasicitySpontaneityPropertiesThrombus Aging +---------+---------------+---------+-----------+----------+--------------+ CFV      Full           Yes      Yes                                 +---------+---------------+---------+-----------+----------+--------------+ SFJ      Full                                                        +---------+---------------+---------+-----------+----------+--------------+ FV Prox  Full                                                        +---------+---------------+---------+-----------+----------+--------------+ FV Mid   Full                                                        +---------+---------------+---------+-----------+----------+--------------+ FV DistalFull                                                        +---------+---------------+---------+-----------+----------+--------------+ PFV      Full                                                        +---------+---------------+---------+-----------+----------+--------------+  POP      Full           Yes      Yes                                 +---------+---------------+---------+-----------+----------+--------------+ PTV      Full                                                        +---------+---------------+---------+-----------+----------+--------------+ PERO     Full                                                         +---------+---------------+---------+-----------+----------+--------------+     Summary: BILATERAL: - No evidence of deep vein thrombosis seen in the lower extremities, bilaterally. -   *See table(s) above for measurements and observations. Electronically signed by Servando Snare MD on 04/24/2021 at 4:41:16 PM.    Final    CT ANGIO HEAD NECK W WO CM W PERF (CODE STROKE)  Result Date: 04/25/2021 CLINICAL DATA:  Code stroke follow-up, possible hyperdense left ICA and MCA EXAM: CT ANGIOGRAPHY HEAD AND NECK CT PERFUSION BRAIN TECHNIQUE: Multidetector CT imaging of the head and neck was performed using the standard protocol during bolus administration of intravenous contrast. Multiplanar CT image reconstructions and MIPs were obtained to evaluate the vascular anatomy. Carotid stenosis measurements (when applicable) are obtained utilizing NASCET criteria, using the distal internal carotid diameter as the denominator. Multiphase CT imaging of the brain was performed following IV bolus contrast injection. Subsequent parametric perfusion maps were calculated using RAPID software. CONTRAST:  8mL OMNIPAQUE IOHEXOL 350 MG/ML SOLN COMPARISON:  Correlation made with prior head CT FINDINGS: CTA NECK FINDINGS Aortic arch: Mixed plaque along the arch with partial visualization of aneurysm better seen on recent CT chest. Great vessel origins are patent. Right carotid system: Patent. Mild mixed plaque at the ICA origin without stenosis. Left carotid system: Patent. Mild calcified plaque at the ICA origin causing minimal stenosis. Vertebral arteries: Patent. Left vertebral artery is dominant. No stenosis. Skeleton: Advanced degenerative changes of the cervical spine. Degenerative changes of the left temporomandibular joint. Other neck: Unremarkable. Upper chest: Increased ground-glass density, patchy consolidation, and interlobular septal thickening in the included upper lobes. Review of the MIP images confirms the above findings CTA  HEAD FINDINGS Anterior circulation: Intracranial internal carotid arteries are patent. Calcified plaque is present causing mild to moderate stenosis in the right paraclinoid region. Anterior cerebral arteries are patent with anterior communicating artery present. Middle cerebral arteries are patent. Posterior circulation: Intracranial vertebral arteries are patent with calcified plaque causing mild stenosis. Basilar artery is patent. Major cerebellar artery origins are patent. Posterior cerebral arteries are patent. Venous sinuses: As permitted by contrast timing, patent. Review of the MIP images confirms the above findings CT Brain Perfusion Findings: CBF (<30%) Volume: 58mL Perfusion (Tmax>6.0s) volume: 31mL Mismatch Volume: 20mL Infarction Location: None. IMPRESSION: No large vessel occlusion, hemodynamically significant stenosis, or evidence of dissection. Perfusion imaging demonstrates no evidence of core infarction or penumbra. Progression of ground-glass density and consolidation in the included upper  lobes. Initial results were communicated to Dr. Curly Shores at 10:37 am on 04/25/2021 by text page via the [facility] messaging system. Electronically Signed   By: Macy Mis M.D.   On: 04/25/2021 10:48    Microbiology: Recent Results (from the past 240 hour(s))  Resp Panel by RT-PCR (Flu A&B, Covid) Nasopharyngeal Swab     Status: None   Collection Time: 04/20/2021 10:07 AM   Specimen: Nasopharyngeal Swab; Nasopharyngeal(NP) swabs in vial transport medium  Result Value Ref Range Status   SARS Coronavirus 2 by RT PCR NEGATIVE NEGATIVE Final    Comment: (NOTE) SARS-CoV-2 target nucleic acids are NOT DETECTED.  The SARS-CoV-2 RNA is generally detectable in upper respiratory specimens during the acute phase of infection. The lowest concentration of SARS-CoV-2 viral copies this assay can detect is 138 copies/mL. A negative result does not preclude SARS-Cov-2 infection and should not be used as the sole basis for  treatment or other patient management decisions. A negative result may occur with  improper specimen collection/handling, submission of specimen other than nasopharyngeal swab, presence of viral mutation(s) within the areas targeted by this assay, and inadequate number of viral copies(<138 copies/mL). A negative result must be combined with clinical observations, patient history, and epidemiological information. The expected result is Negative.  Fact Sheet for Patients:  EntrepreneurPulse.com.au  Fact Sheet for Healthcare Providers:  IncredibleEmployment.be  This test is no t yet approved or cleared by the [place] FDA and  has been authorized for detection and/or diagnosis of SARS-CoV-2 by FDA under an Emergency Use Authorization (EUA). This EUA will remain  in effect (meaning this test can be used) for the duration of the COVID-19 declaration under Section 564(b)(1) of the Act, 21 U.S.C.section 360bbb-3(b)(1), unless the authorization is terminated  or revoked sooner.       Influenza A by PCR NEGATIVE NEGATIVE Final   Influenza B by PCR NEGATIVE NEGATIVE Final    Comment: (NOTE) The Xpert Xpress SARS-CoV-2/FLU/RSV plus assay is intended as an aid in the diagnosis of influenza from Nasopharyngeal swab specimens and should not be used as a sole basis for treatment. Nasal washings and aspirates are unacceptable for Xpert Xpress SARS-CoV-2/FLU/RSV testing.  Fact Sheet for Patients: EntrepreneurPulse.com.au  Fact Sheet for Healthcare Providers: IncredibleEmployment.be  This test is not yet approved or cleared by the [place] FDA and has been authorized for detection and/or diagnosis of SARS-CoV-2 by FDA under an Emergency Use Authorization (EUA). This EUA will remain in effect (meaning this test can be used) for the duration of the COVID-19 declaration under Section 564(b)(1) of the Act, 21  U.S.C. section 360bbb-3(b)(1), unless the authorization is terminated or revoked.  Performed at [facility], [address] [place], [place] [address]   Culture, blood (Routine X 2) w Reflex to ID Panel     Status: None (Preliminary result)   Collection Time: 03/28/2021 10:08 PM   Specimen: BLOOD RIGHT HAND  Result Value Ref Range Status   Specimen Description BLOOD RIGHT HAND  Final   Special Requests   Final    BOTTLES DRAWN AEROBIC AND ANAEROBIC Blood Culture results may not be optimal due to an excessive volume of blood received in culture bottles   Culture   Final    NO GROWTH 4 DAYS Performed at [facility], [place] [address], [place], [place] [address]    Report Status PENDING  Incomplete  Culture, blood (Routine X 2) w Reflex to ID Panel  Status: None (Preliminary result)   Collection Time: 04/13/2021 10:10 PM   Specimen: BLOOD LEFT HAND  Result Value Ref Range Status   Specimen Description BLOOD LEFT HAND  Final   Special Requests   Final    BOTTLES DRAWN AEROBIC AND ANAEROBIC Blood Culture adequate volume   Culture   Final    NO GROWTH 4 DAYS Performed at [facility], [address], [place], [place] [address]    Report Status PENDING  Incomplete  Resp Panel by RT-PCR (Flu A&B, Covid) Nasopharyngeal Swab     Status: None   Collection Time: 04/24/21 11:13 AM   Specimen: Nasopharyngeal Swab; Nasopharyngeal(NP) swabs in vial transport medium  Result Value Ref Range Status   SARS Coronavirus 2 by RT PCR NEGATIVE NEGATIVE Final    Comment: (NOTE) SARS-CoV-2 target nucleic acids are NOT DETECTED.  The SARS-CoV-2 RNA is generally detectable in upper respiratory specimens during the acute phase of infection. The lowest concentration of SARS-CoV-2 viral copies this assay can detect is 138 copies/mL. A negative result does not preclude SARS-Cov-2 infection and should not be used as the sole basis for treatment or other patient management  decisions. A negative result may occur with  improper specimen collection/handling, submission of specimen other than nasopharyngeal swab, presence of viral mutation(s) within the areas targeted by this assay, and inadequate number of viral copies(<138 copies/mL). A negative result must be combined with clinical observations, patient history, and epidemiological information. The expected result is Negative.  Fact Sheet for Patients:  EntrepreneurPulse.com.au  Fact Sheet for Healthcare Providers:  IncredibleEmployment.be  This test is no t yet approved or cleared by the [place] FDA and  has been authorized for detection and/or diagnosis of SARS-CoV-2 by FDA under an Emergency Use Authorization (EUA). This EUA will remain  in effect (meaning this test can be used) for the duration of the COVID-19 declaration under Section 564(b)(1) of the Act, 21 U.S.C.section 360bbb-3(b)(1), unless the authorization is terminated  or revoked sooner.       Influenza A by PCR NEGATIVE NEGATIVE Final   Influenza B by PCR NEGATIVE NEGATIVE Final    Comment: (NOTE) The Xpert Xpress SARS-CoV-2/FLU/RSV plus assay is intended as an aid in the diagnosis of influenza from Nasopharyngeal swab specimens and should not be used as a sole basis for treatment. Nasal washings and aspirates are unacceptable for Xpert Xpress SARS-CoV-2/FLU/RSV testing.  Fact Sheet for Patients: EntrepreneurPulse.com.au  Fact Sheet for Healthcare Providers: IncredibleEmployment.be  This test is not yet approved or cleared by the [place] FDA and has been authorized for detection and/or diagnosis of SARS-CoV-2 by FDA under an Emergency Use Authorization (EUA). This EUA will remain in effect (meaning this test can be used) for the duration of the COVID-19 declaration under Section 564(b)(1) of the Act, 21 U.S.C. section 360bbb-3(b)(1), unless the  authorization is terminated or revoked.  Performed at [facility], [place] [address], [place], [place] [address]   MRSA PCR Screening     Status: None   Collection Time: 04/25/21  6:16 PM   Specimen: Nasopharyngeal  Result Value Ref Range Status   MRSA by PCR NEGATIVE NEGATIVE Final    Comment:        The GeneXpert MRSA Assay (FDA approved for NASAL specimens only), is one component of a comprehensive MRSA colonization surveillance program. It is not intended to diagnose MRSA infection nor to guide or monitor treatment for MRSA infections. Performed at [facility], [address] [place], [place] [address]      Labs: Basic Metabolic Panel: Recent Labs  Lab 04/03/2021 0958 04/02/2021 1956 04/23/21 0434 04/24/21 0155 04/25/21 0222 04/26/21 0201  NA 134*  --  136 139 140 143  K 4.1  --  3.7 3.9 4.0 3.8  CL 100  --  101 105 109 105  CO2 19*  --  22 23 23 26   GLUCOSE 160*  --  182* 146* 153* 171*  BUN 32*  --  42* 58* 40* 40*  CREATININE 0.98  --  1.19 1.35* 1.02 1.29*  CALCIUM 8.8*  --  9.1 8.9 8.5* 8.9  MG  --  1.8 1.8  --   --   --   PHOS  --   --  3.2  --   --   --    Liver Function Tests: Recent Labs  Lab 04/09/2021 0958 04/23/21 0434 04/24/21 0155 04/25/21 0222 04/26/21 0201  AST 23 30 43* 34 30  ALT 10 17 23 24 24   ALKPHOS 69 67 62 73 93  BILITOT 0.8 0.6 0.5 0.7 1.3*  PROT 6.0* 6.3* 5.5* 5.1* 5.8*  ALBUMIN 3.3* 2.6* 2.4* 2.3* 2.5*   No results for input(s): LIPASE, AMYLASE in the last 168 hours. No results for input(s): AMMONIA in the last 168 hours. CBC: Recent Labs  Lab 04/18/2021 0958 04/23/21 0434 04/24/21 0155 04/25/21 0222 04/26/21 0201  WBC 12.8* 9.9 19.1* 13.2* 16.8*  NEUTROABS  --  8.9*  --   --   --   HGB 12.4* 12.4* 11.5* 11.7* 12.5*  HCT 36.2* 36.0* 33.6* 34.1* 37.8*  MCV 89.6 90.0 89.8 90.2 91.5  PLT 303 266 309 204 145*   Cardiac Enzymes: No results for input(s): CKTOTAL, CKMB, CKMBINDEX, TROPONINI in the last 168  hours. D-Dimer No results for input(s): DDIMER in the last 72 hours. BNP: Invalid input(s): POCBNP CBG: Recent Labs  Lab 04/25/21 0937 04/25/21 1839  GLUCAP 233* 167*   Anemia work up No results for input(s): VITAMINB12, FOLATE, FERRITIN, TIBC, IRON, RETICCTPCT in the last 72 hours. Urinalysis    Component Value Date/Time   COLORURINE YELLOW 04/24/2021 1640   APPEARANCEUR HAZY (A) 04/24/2021 1640   LABSPEC 1.018 04/24/2021 1640   PHURINE 5.0 04/24/2021 1640   GLUCOSEU NEGATIVE 04/24/2021 1640   HGBUR LARGE (A) 04/24/2021 1640   BILIRUBINUR NEGATIVE 04/24/2021 1640   KETONESUR NEGATIVE 04/24/2021 1640   PROTEINUR 30 (A) 04/24/2021 1640   UROBILINOGEN 1.0 07/31/2008 1219   NITRITE NEGATIVE 04/24/2021 1640   LEUKOCYTESUR NEGATIVE 04/24/2021 1640   Sepsis Labs Invalid input(s): PROCALCITONIN,  WBC,  LACTICIDVEN    SIGNED:  Marylu Lund, MD  [facility] May 20, 2021, 8:21 AM  If 7PM-7AM, please contact night-coverage [email or web address] Password [email or web address]

## 2021-05-23 NOTE — Progress Notes (Signed)
At approx 0115 writer was called to room by pts family. Pt ws noted to have no rise and fall of chest and pulse was absent. Was verified by RN charge nurse. Family was at bedside at the time. Notified Md on call of pt's status. Family given time with the pt and gathered all personal belongings.

## 2021-05-23 NOTE — Progress Notes (Signed)
Remaining morphine wasted after patient's passing.  Approximately 117 ml wasted, witnessed by this RN and patient's nurse [facility] LPN.

## 2021-05-23 DEATH — deceased
